# Patient Record
Sex: Female | Born: 1945 | Race: Black or African American | Hispanic: No | Smoking: Former smoker
Health system: Southern US, Community
[De-identification: ages and names within clinical notes are randomized; demographics above are authoritative.]

## PROBLEM LIST (undated history)

## (undated) DIAGNOSIS — E782 Mixed hyperlipidemia: Secondary | ICD-10-CM

## (undated) DIAGNOSIS — H269 Unspecified cataract: Secondary | ICD-10-CM

## (undated) DIAGNOSIS — M1990 Unspecified osteoarthritis, unspecified site: Secondary | ICD-10-CM

## (undated) DIAGNOSIS — J209 Acute bronchitis, unspecified: Secondary | ICD-10-CM

## (undated) DIAGNOSIS — Z8673 Personal history of transient ischemic attack (TIA), and cerebral infarction without residual deficits: Secondary | ICD-10-CM

## (undated) DIAGNOSIS — R011 Cardiac murmur, unspecified: Secondary | ICD-10-CM

## (undated) DIAGNOSIS — E119 Type 2 diabetes mellitus without complications: Secondary | ICD-10-CM

## (undated) DIAGNOSIS — I10 Essential (primary) hypertension: Secondary | ICD-10-CM

## (undated) HISTORY — PX: JOINT REPLACEMENT: SHX530

## (undated) HISTORY — DX: Essential (primary) hypertension: I10

## (undated) HISTORY — PX: TYMPANOSTOMY: SHX2586

## (undated) HISTORY — PX: NECK SURGERY: SHX720

## (undated) HISTORY — DX: Acute bronchitis, unspecified: J20.9

## (undated) HISTORY — DX: Mixed hyperlipidemia: E78.2

## (undated) HISTORY — DX: Cardiac murmur, unspecified: R01.1

## (undated) HISTORY — DX: Unspecified cataract: H26.9

## (undated) HISTORY — DX: Unspecified osteoarthritis, unspecified site: M19.90

## (undated) HISTORY — DX: Personal history of transient ischemic attack (TIA), and cerebral infarction without residual deficits: Z86.73

## (undated) HISTORY — DX: Type 2 diabetes mellitus without complications: E11.9

---

## 1999-01-06 ENCOUNTER — Ambulatory Visit (HOSPITAL_COMMUNITY): Admission: RE | Admit: 1999-01-06 | Discharge: 1999-01-06 | Payer: Self-pay | Admitting: Internal Medicine

## 1999-01-06 ENCOUNTER — Encounter: Payer: Self-pay | Admitting: Internal Medicine

## 1999-07-05 ENCOUNTER — Ambulatory Visit (HOSPITAL_COMMUNITY): Admission: RE | Admit: 1999-07-05 | Discharge: 1999-07-05 | Payer: Self-pay | Admitting: Endocrinology

## 1999-07-05 ENCOUNTER — Encounter: Payer: Self-pay | Admitting: Endocrinology

## 2000-01-08 ENCOUNTER — Encounter: Admission: RE | Admit: 2000-01-08 | Discharge: 2000-01-08 | Payer: Self-pay | Admitting: Otolaryngology

## 2000-01-08 ENCOUNTER — Encounter: Payer: Self-pay | Admitting: Otolaryngology

## 2000-10-31 ENCOUNTER — Encounter: Payer: Self-pay | Admitting: Otolaryngology

## 2000-10-31 ENCOUNTER — Encounter: Admission: RE | Admit: 2000-10-31 | Discharge: 2000-10-31 | Payer: Self-pay | Admitting: Otolaryngology

## 2000-11-04 ENCOUNTER — Encounter (INDEPENDENT_AMBULATORY_CARE_PROVIDER_SITE_OTHER): Payer: Self-pay | Admitting: Specialist

## 2000-11-04 ENCOUNTER — Ambulatory Visit (HOSPITAL_BASED_OUTPATIENT_CLINIC_OR_DEPARTMENT_OTHER): Admission: RE | Admit: 2000-11-04 | Discharge: 2000-11-04 | Payer: Self-pay | Admitting: Otolaryngology

## 2002-08-03 ENCOUNTER — Other Ambulatory Visit: Admission: RE | Admit: 2002-08-03 | Discharge: 2002-08-03 | Payer: Self-pay | Admitting: Family Medicine

## 2004-02-07 ENCOUNTER — Other Ambulatory Visit: Admission: RE | Admit: 2004-02-07 | Discharge: 2004-02-07 | Payer: Self-pay | Admitting: Family Medicine

## 2005-03-06 ENCOUNTER — Ambulatory Visit: Payer: Self-pay | Admitting: Family Medicine

## 2005-03-13 ENCOUNTER — Ambulatory Visit: Payer: Self-pay

## 2005-03-19 ENCOUNTER — Inpatient Hospital Stay (HOSPITAL_COMMUNITY): Admission: RE | Admit: 2005-03-19 | Discharge: 2005-03-22 | Payer: Self-pay | Admitting: Orthopedic Surgery

## 2006-02-04 ENCOUNTER — Ambulatory Visit: Payer: Self-pay | Admitting: Family Medicine

## 2006-05-07 ENCOUNTER — Ambulatory Visit: Payer: Self-pay | Admitting: Family Medicine

## 2007-04-08 ENCOUNTER — Encounter: Payer: Self-pay | Admitting: Internal Medicine

## 2007-05-06 ENCOUNTER — Ambulatory Visit: Payer: Self-pay | Admitting: Family Medicine

## 2007-06-24 ENCOUNTER — Ambulatory Visit: Payer: Self-pay | Admitting: Internal Medicine

## 2007-06-24 ENCOUNTER — Encounter: Payer: Self-pay | Admitting: Family Medicine

## 2007-07-08 DIAGNOSIS — I10 Essential (primary) hypertension: Secondary | ICD-10-CM | POA: Insufficient documentation

## 2007-07-11 ENCOUNTER — Ambulatory Visit: Payer: Self-pay | Admitting: Family Medicine

## 2007-07-11 LAB — CONVERTED CEMR LAB
Bilirubin Urine: NEGATIVE
Glucose, Urine, Semiquant: NEGATIVE
Nitrite: NEGATIVE
Protein, U semiquant: NEGATIVE
Specific Gravity, Urine: 1.02
Urobilinogen, UA: 0.2
pH: 7

## 2007-07-14 LAB — CONVERTED CEMR LAB
ALT: 25 units/L (ref 0–35)
AST: 19 units/L (ref 0–37)
Albumin: 3.5 g/dL (ref 3.5–5.2)
Alkaline Phosphatase: 79 units/L (ref 39–117)
BUN: 10 mg/dL (ref 6–23)
Basophils Absolute: 0 10*3/uL (ref 0.0–0.1)
Basophils Relative: 0.3 % (ref 0.0–1.0)
Bilirubin, Direct: 0.1 mg/dL (ref 0.0–0.3)
CO2: 28 meq/L (ref 19–32)
Calcium: 9.6 mg/dL (ref 8.4–10.5)
Chloride: 112 meq/L (ref 96–112)
Cholesterol: 282 mg/dL (ref 0–200)
Creatinine, Ser: 0.7 mg/dL (ref 0.4–1.2)
Direct LDL: 212.8 mg/dL
Eosinophils Absolute: 0.1 10*3/uL (ref 0.0–0.6)
Eosinophils Relative: 1.4 % (ref 0.0–5.0)
GFR calc Af Amer: 110 mL/min
GFR calc non Af Amer: 91 mL/min
Glucose, Bld: 136 mg/dL — ABNORMAL HIGH (ref 70–99)
HCT: 40.8 % (ref 36.0–46.0)
HDL: 51.7 mg/dL (ref >39.0)
Hemoglobin: 13.8 g/dL (ref 12.0–15.0)
Lymphocytes Relative: 36.3 % (ref 12.0–46.0)
MCHC: 33.7 g/dL (ref 30.0–36.0)
MCV: 93.8 fL (ref 78.0–100.0)
Monocytes Absolute: 0.8 10*3/uL — ABNORMAL HIGH (ref 0.2–0.7)
Monocytes Relative: 12.2 % — ABNORMAL HIGH (ref 3.0–11.0)
Neutro Abs: 3.5 10*3/uL (ref 1.4–7.7)
Neutrophils Relative %: 49.8 % (ref 43.0–77.0)
Platelets: 230 10*3/uL (ref 150–400)
Potassium: 3.8 meq/L (ref 3.5–5.1)
RBC: 4.35 M/uL (ref 3.87–5.11)
RDW: 13.8 % (ref 11.5–14.6)
Sodium: 146 meq/L — ABNORMAL HIGH (ref 135–145)
TSH: 0.92 microintl units/mL (ref 0.35–5.50)
Total Bilirubin: 0.7 mg/dL (ref 0.3–1.2)
Total CHOL/HDL Ratio: 5.5
Total Protein: 6.6 g/dL (ref 6.0–8.3)
Triglycerides: 129 mg/dL (ref 0–149)
VLDL: 26 mg/dL (ref 0–40)
WBC: 6.9 10*3/uL (ref 4.5–10.5)

## 2007-07-18 ENCOUNTER — Ambulatory Visit: Payer: Self-pay | Admitting: Family Medicine

## 2007-07-18 DIAGNOSIS — E119 Type 2 diabetes mellitus without complications: Secondary | ICD-10-CM

## 2007-07-18 DIAGNOSIS — E782 Mixed hyperlipidemia: Secondary | ICD-10-CM | POA: Insufficient documentation

## 2007-07-18 HISTORY — DX: Mixed hyperlipidemia: E78.2

## 2007-07-18 HISTORY — DX: Type 2 diabetes mellitus without complications: E11.9

## 2007-07-24 ENCOUNTER — Encounter: Payer: Self-pay | Admitting: Family Medicine

## 2007-12-29 ENCOUNTER — Encounter: Payer: Self-pay | Admitting: Family Medicine

## 2008-04-29 ENCOUNTER — Encounter: Payer: Self-pay | Admitting: Family Medicine

## 2008-05-21 ENCOUNTER — Emergency Department (HOSPITAL_COMMUNITY): Admission: EM | Admit: 2008-05-21 | Discharge: 2008-05-21 | Payer: Self-pay | Admitting: Family Medicine

## 2008-06-15 ENCOUNTER — Telehealth (INDEPENDENT_AMBULATORY_CARE_PROVIDER_SITE_OTHER): Payer: Self-pay | Admitting: *Deleted

## 2008-06-21 ENCOUNTER — Inpatient Hospital Stay (HOSPITAL_COMMUNITY): Admission: RE | Admit: 2008-06-21 | Discharge: 2008-06-23 | Payer: Self-pay | Admitting: Orthopedic Surgery

## 2008-06-23 ENCOUNTER — Ambulatory Visit: Payer: Self-pay | Admitting: Surgery

## 2008-06-23 ENCOUNTER — Encounter (INDEPENDENT_AMBULATORY_CARE_PROVIDER_SITE_OTHER): Payer: Self-pay | Admitting: Orthopedic Surgery

## 2008-07-15 ENCOUNTER — Encounter: Admission: RE | Admit: 2008-07-15 | Discharge: 2008-10-13 | Payer: Self-pay | Admitting: Orthopedic Surgery

## 2008-07-22 ENCOUNTER — Ambulatory Visit: Payer: Self-pay | Admitting: Family Medicine

## 2008-07-22 DIAGNOSIS — J209 Acute bronchitis, unspecified: Secondary | ICD-10-CM

## 2008-07-22 HISTORY — DX: Acute bronchitis, unspecified: J20.9

## 2009-08-16 ENCOUNTER — Ambulatory Visit: Payer: Self-pay | Admitting: Family Medicine

## 2009-08-16 LAB — CONVERTED CEMR LAB
Bilirubin Urine: NEGATIVE
Blood in Urine, dipstick: NEGATIVE
Glucose, Urine, Semiquant: NEGATIVE
Ketones, urine, test strip: NEGATIVE
Nitrite: NEGATIVE
Protein, U semiquant: NEGATIVE
Specific Gravity, Urine: 1.025
Urobilinogen, UA: 0.2
pH: 5

## 2009-08-19 LAB — CONVERTED CEMR LAB
AST: 26 units/L (ref 0–37)
Albumin: 4 g/dL (ref 3.5–5.2)
Alkaline Phosphatase: 79 units/L (ref 39–117)
Basophils Relative: 0.5 % (ref 0.0–3.0)
Bilirubin, Direct: 0 mg/dL (ref 0.0–0.3)
CO2: 27 meq/L (ref 19–32)
Calcium: 10 mg/dL (ref 8.4–10.5)
Creatinine, Ser: 0.8 mg/dL (ref 0.4–1.2)
Creatinine,U: 220.3 mg/dL
Eosinophils Absolute: 0.2 10*3/uL (ref 0.0–0.7)
GFR calc non Af Amer: 93.27 mL/min (ref >60)
Hemoglobin: 14.6 g/dL (ref 12.0–15.0)
Lymphocytes Relative: 40.7 % (ref 12.0–46.0)
MCHC: 33.3 g/dL (ref 30.0–36.0)
Microalb Creat Ratio: 11.8 mg/g (ref 0.0–30.0)
Monocytes Relative: 11.7 % (ref 3.0–12.0)
Neutro Abs: 4.1 10*3/uL (ref 1.4–7.7)
Neutrophils Relative %: 45.3 % (ref 43.0–77.0)
RBC: 4.56 M/uL (ref 3.87–5.11)
Sodium: 142 meq/L (ref 135–145)
Total Protein: 7.8 g/dL (ref 6.0–8.3)
WBC: 9.1 10*3/uL (ref 4.5–10.5)

## 2009-10-14 ENCOUNTER — Ambulatory Visit: Payer: Self-pay | Admitting: Family Medicine

## 2009-10-18 ENCOUNTER — Telehealth: Payer: Self-pay | Admitting: Family Medicine

## 2009-10-25 ENCOUNTER — Encounter (INDEPENDENT_AMBULATORY_CARE_PROVIDER_SITE_OTHER): Payer: Self-pay | Admitting: *Deleted

## 2011-02-09 ENCOUNTER — Encounter: Payer: Self-pay | Admitting: Family Medicine

## 2011-02-12 ENCOUNTER — Other Ambulatory Visit: Payer: Self-pay

## 2011-02-12 DIAGNOSIS — E785 Hyperlipidemia, unspecified: Secondary | ICD-10-CM

## 2011-02-12 DIAGNOSIS — Z0000 Encounter for general adult medical examination without abnormal findings: Secondary | ICD-10-CM

## 2011-02-12 LAB — POCT URINALYSIS DIPSTICK
Bilirubin, UA: NEGATIVE
Glucose, UA: NEGATIVE
Ketones, UA: NEGATIVE
Leukocytes, UA: NEGATIVE
pH, UA: 5

## 2011-02-12 LAB — CBC WITH DIFFERENTIAL/PLATELET
Eosinophils Relative: 1.2 % (ref 0.0–5.0)
HCT: 46.2 % — ABNORMAL HIGH (ref 36.0–46.0)
Hemoglobin: 15.7 g/dL — ABNORMAL HIGH (ref 12.0–15.0)
Lymphs Abs: 2.7 10*3/uL (ref 0.7–4.0)
MCV: 98.3 fl (ref 78.0–100.0)
Monocytes Absolute: 1 10*3/uL (ref 0.1–1.0)
Monocytes Relative: 12.4 % — ABNORMAL HIGH (ref 3.0–12.0)
Neutro Abs: 4 10*3/uL (ref 1.4–7.7)
WBC: 7.9 10*3/uL (ref 4.5–10.5)

## 2011-02-12 LAB — LIPID PANEL
Cholesterol: 313 mg/dL — ABNORMAL HIGH (ref 0–200)
Triglycerides: 180 mg/dL — ABNORMAL HIGH (ref 0.0–149.0)

## 2011-02-12 LAB — BASIC METABOLIC PANEL
Chloride: 100 mEq/L (ref 96–112)
GFR: 113.9 mL/min (ref >60.00)
Glucose, Bld: 154 mg/dL — ABNORMAL HIGH (ref 70–99)
Potassium: 4 mEq/L (ref 3.5–5.1)
Sodium: 137 mEq/L (ref 135–145)

## 2011-02-12 LAB — HEPATIC FUNCTION PANEL
ALT: 26 U/L (ref 0–35)
AST: 21 U/L (ref 0–37)
Total Protein: 7.2 g/dL (ref 6.0–8.3)

## 2011-02-12 LAB — LDL CHOLESTEROL, DIRECT: Direct LDL: 231.8 mg/dL

## 2011-02-19 ENCOUNTER — Telehealth: Payer: Self-pay

## 2011-02-19 NOTE — Telephone Encounter (Signed)
Message copied by Edgar Frisk on Mon Feb 19, 2011  8:30 AM ------      Message from: Jacquenette Shone      Created: Fri Feb 16, 2011  5:25 PM       Normal except her diabetes is not well controlled and her chol is very high. We will discuss these at her cpx.

## 2011-02-19 NOTE — Telephone Encounter (Signed)
Pt aware of results and appt is 02-22-2011

## 2011-02-21 ENCOUNTER — Encounter: Payer: Self-pay | Admitting: Family Medicine

## 2011-02-26 ENCOUNTER — Ambulatory Visit (INDEPENDENT_AMBULATORY_CARE_PROVIDER_SITE_OTHER): Payer: Self-pay | Admitting: Family Medicine

## 2011-02-26 ENCOUNTER — Encounter: Payer: Self-pay | Admitting: Family Medicine

## 2011-02-26 VITALS — BP 160/96 | HR 82 | Ht 65.0 in | Wt 243.0 lb

## 2011-02-26 DIAGNOSIS — Z0000 Encounter for general adult medical examination without abnormal findings: Secondary | ICD-10-CM

## 2011-02-26 MED ORDER — AMLODIPINE-OLMESARTAN 10-40 MG PO TABS: 1.0000 | ORAL_TABLET | Freq: Every day | ORAL | Status: DC

## 2011-02-26 MED ORDER — METFORMIN HCL 500 MG PO TABS: 500.0000 mg | ORAL_TABLET | Freq: Two times a day (BID) | ORAL | Status: DC

## 2011-02-26 MED ORDER — PRAVASTATIN SODIUM 40 MG PO TABS: 40.0000 mg | ORAL_TABLET | Freq: Every evening | ORAL | Status: DC

## 2011-02-26 NOTE — Progress Notes (Signed)
  Subjective:    Patient ID: [name], female    DOB: May 02, 1946, 65 y.o.   MRN: [id number]  HPI 65 yr old female for a cpx. She feels well in general, but she admits that she has not been taking care of herself. She does not exercise and she eats all the wring foods. She retired about a year ago, and she says now she sits around a lot and eats. She does not have insurance, but she plans to enroll in [place] by next January.    Review of Systems  Constitutional: Negative.   HENT: Negative.   Eyes: Negative.   Respiratory: Negative.   Cardiovascular: Negative.   Gastrointestinal: Negative.   Genitourinary: Negative for dysuria, urgency, frequency, hematuria, flank pain, decreased urine volume, enuresis, difficulty urinating, pelvic pain and dyspareunia.  Musculoskeletal: Negative.   Skin: Negative.   Neurological: Negative.   Hematological: Negative.   Psychiatric/Behavioral: Negative.        Objective:   Physical Exam  Constitutional: She is oriented to person, place, and time. She appears well-developed and well-nourished. No distress.  HENT:  Head: Normocephalic and atraumatic.  Right Ear: External ear normal.  Left Ear: External ear normal.  Nose: Nose normal.  Mouth/Throat: Oropharynx is clear and moist. No oropharyngeal exudate.  Eyes: Conjunctivae and EOM are normal. Pupils are equal, round, and reactive to light. No scleral icterus.  Neck: Normal range of motion. Neck supple. No JVD present. No thyromegaly present.  Cardiovascular: Normal rate, regular rhythm, normal heart sounds and intact distal pulses.  Exam reveals no gallop and no friction rub.   No murmur heard. Pulmonary/Chest: Effort normal and breath sounds normal. No respiratory distress. She has no wheezes. She has no rales. She exhibits no tenderness.  Abdominal: Soft. Bowel sounds are normal. She exhibits no distension and no mass. There is no tenderness. There is no rebound and no guarding.    Genitourinary: No breast swelling, tenderness, discharge or bleeding.  Musculoskeletal: Normal range of motion. She exhibits no edema and no tenderness.  Lymphadenopathy:    She has no cervical adenopathy.  Neurological: She is alert and oriented to person, place, and time. She has normal reflexes. No cranial nerve deficit. She exhibits normal muscle tone. Coordination normal.  Skin: Skin is warm and dry. No rash noted. No erythema.  Psychiatric: She has a normal mood and affect. Her behavior is normal. Judgment and thought content normal.          Assessment & Plan:  She is obese and she has problems including poorly controlled DM, HTN, and lipids. She also has some microscopic hematuria. She refuses to see a Urologist due to lack of money now, but she states that when her Medicare activates that she will go to Urology. She wants to put off a colonoscopy until then as well. She has joined the [employer] and wants to do [employer]. We will try to switch her meds to ones that she can get for $10 a month.

## 2011-04-10 NOTE — Op Note (Signed)
[name], [name]              ACCOUNT NO.:  [id number]   MEDICAL RECORD NO.:  [id number]          PATIENT TYPE:  INP   LOCATION:  4155                         FACILITY:  [facility]   PHYSICIAN:  Audree Camel. Noemi Chapel, M.D. DATE OF BIRTH:  [birth year]   DATE OF PROCEDURE:  06/21/2008  DATE OF DISCHARGE:                               OPERATIVE REPORT   PREOPERATIVE DIAGNOSIS:  Left knee degenerative joint disease.   POSTOPERATIVE DIAGNOSIS:  Left knee degenerative joint disease.   PROCEDURE:  Left total knee replacement using DePuy cemented total knee  system with #3 cemented femur and #3 cemented tibia with 15-mm  polyethylene RP tibial spacer and 32-mm polyethylene cemented patella.   SURGEON:  Audree Camel. Noemi Chapel, MD   ASSISTANT:  Matthew Saras, PA   ANESTHESIA:  General.   OPERATIVE TIME:  1 hour and 20 minutes.   COMPLICATIONS:  None.   DESCRIPTION FOR PROCEDURE:  Ms. Contant was brought to operating room on  June 21, 2008, after a femoral nerve block was placed in holding by  anesthesia.  She was placed on the operative table in supine position.  After being placed under general anesthesia, she had a Foley catheter  placed under sterile conditions.  She received Ancef 2 g IV  preoperatively for prophylaxis.  Her left knee was examined under  anesthesia.  Range of motion from -5 to 125 degrees with moderate varus  deformity with stable ligamentous exam with normal patellar tracking.  Left leg was then prepped using sterile DuraPrep and draped using  sterile technique.  Leg was exsanguinated and a thigh tourniquet  elevated at 365 mm.  Initially, through a 15 cm longitudinal incision  based over the patella, initial exposure was made.  The underlying subcutaneous tissues were incised along with skin  incision.  A median arthrotomy was performed revealing an excessive  amount of normal-appearing joint fluid.  The articular surfaces were  inspected.  She had grade 4 changes  medially, grade 3 changes laterally,  grade 3 to 4 changes in the patellofemoral joint.  Osteophytes removed  from the femoral condyles and tibial plateau.  The medial and lateral  meniscal remnants were removed, as well as anterior cruciate ligament.  An intramedullary drill was then drilled up the femoral canal for  placement of distal femoral cutting jig which was placed in the  appropriate manner rotation and the distal 11 mm cut was made.  The  distal femur was incised.  A #3 was found to be the appropriate size.  A  #3 cutting jig was placed in the appropriate manner of external rotation  and then these cuts were made.  The proximal tibia was then exposed.  The tibial spines were removed with an oscillating saw.  Intramedullary  drill was drilled down the tibial canal for placement of proximal tibial  cutting jig, which was placed in the appropriate manner rotation and a  proximal 6 mm cut was made based off the medial or lower side.  Spacer  blocks were then placed in flexion and extension.  The 15-mm blocks gave  excellent balancing,  excellent stability, and excellent correction of  her flexion and varus deformities.  A #3 tibial base plate trial was  placed on the cut tibial surface, found to be an excellent fit and then  a keel cut was made.  The PCL boxcutter was then placed on the distal  femur and then these cuts were made.  At this point, the #3 femoral  trial was placed with a #3 tibial base plate trial and a QA348G  polyethylene RP tibial spacer.  The knee was reduced, taken through  range of motion from 0 to 125 degrees with excellent stability and  excellent correction of her flexion and varus deformities and normal  patellar tracking.  A resurfacing 9 mm cut was then made on the patella  and 3 locking holes placed for a 32-mm patella and the patellar trial  was placed and again patellofemoral tracking was evaluated and found to  be normal.  At this point, it was felt that  all the trial components  were of excellent size and good stability.  They were then removed.  The  knee was then jet lavage irrigated with 3 liters of saline.  The  proximal tibia was then exposed.  A #3 tibial baseplate with cement  backing was hammered in position with an excellent fit with excess  cement being removed from around the edges.  A #3 femoral component with  cement backing was hammered into position also with an excellent fit  with excess cement being removed from around the edges.  The 15-mm  polyethylene RP tibial spacer was placed on tibial baseplate.  The knee  reduced, taken through a full range of motion, found to be stable.  Leg  lengths equal.  The 32-mm polyethylene cement backed patella was then  placed in its position and held there with a clamp.  After the cement  hardened, again patellofemoral tracking was evaluated and found to be  normal.  At this point, it was felt that all other components were of  excellent size, fit, and stability.  The wound was further irrigated  with saline.  Tourniquet was released.  Hemostasis obtained with  cautery.  The arthrotomy was then closed with #1 Ethilon suture over 2  medium Hemovac drains.  Subcutaneous tissues closed with 0 and 2-0  Vicryl, subcuticular layer closed with 4-0 Monocryl.  Sterile dressings  and long-leg splint applied.  The patient was then awakened, extubated,  and taken to recovery room in stable condition.  Needle and sponge count  was correct x2 at the end of the case.  Neurovascular status normal  postoperatively.      Robert A. Noemi Chapel, M.D.  Electronically Signed     RAW/MEDQ  D:  06/21/2008  T:  06/22/2008  Job:  [id number]

## 2011-04-13 NOTE — Op Note (Signed)
[place]  Patient:    [name], [name]                     MRN: [id number] Proc. Date: 11/04/00 Adm. Date:  [id number] Attending:  Jeneen Rinks CC:         Royetta Crochet. Karlton Lemon, M.D.  [place]  [id number], [place], [place], [place]  [id number]   Operative Report  JUSTIFICATION FOR PROCEDURE:  [name] is a 65 year old black female here today for a left lateral parotidectomy and bilateral myringotomies and transtympanic modified Richards T-tubes.  [name] presented on August 29, 2000 with a chief complaint of decreased hearing and a left facial mass.  She had had this left facial mass for approximately 1.5 years.  It was slowly enlarging.  She had seen another physician, who had recommended excision.  She desired a second opinion.  She had a CT scan of her parotid area in February 2001.  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  She had had a head cold in the past with noted decreased hearing in both ears.  She had serous otitis media bilaterally with 30 dB conductive hearing losses and normal discrimination.  She was placed on a Sterapred dose pack and modified Valsalva maneuvers.  She was recommended for a left lateral parotidectomy at [facility] with a 23-hour recovery care stay and bilateral myringotomies and transtympanic modified Richards T-tubes since the fluid did not resolve.  Risks and complications of the procedures were explained to her, including, but not limited to infection, bleeding, reaction to anesthesia, facial paresis or paralysis, delayed Freys syndrome, hypertrophic scar formation and need for additional procedures.  She understood that the T-tubes would stay in for a long time and may need to be removed surgically in the future.  JUSTIFICATION FOR  OUTPATIENT SETTING:  Patients age and need for general endotracheal anesthesia.  JUSTIFICATION FOR OVERNIGHT STAY:  Length of anesthesia and postoperative observation of facial function, status post left lateral parotidectomy and the need for drainage.  PREOPERATIVE DIAGNOSES: 1. Left lateral parotid mass. 2. 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  POSTOPERATIVE DIAGNOSIS: 1. Left lateral parotid mass. 2. 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  OPERATION: 1. Left lateral parotidectomy. 2. Bilateral myringotomies and transtympanic modified Richards T-tubes.  SURGEON:  Fannie Knee, M.D.  SECOND SURGEON:  Early Chars. Wilburn Cornelia, M.D.  ANESTHESIA:  Dr. Sherren Kerns, general endotracheal.  COMPLICATIONS:  None.  SUMMARY OF REPORT:  After the patient was taken to the operating room, she was placed in the supine position and an IV was begun by Dr. Sherren Kerns.  A general IV induction was then performed and the patient was orally intubated without difficulty.  Eyelids were taped shut and she was properly positioned and monitored.  Elbows and ankles were padded with foam rubber in the ENT fashion.  A small amount of hair was shaved in the left post auricular area. A doubly modified Blair incision was marked with marking pen and the incision line was infiltrated with 5 cc of 1% Xylocaine with 1:100,000 epinephrine. Silver wire and needle electrodes were then inserted into the forehead, left shoulder, left orbicularis oris and oculi muscles and connected to a Nim facial nerve  monitor preamplifier.  The patients right ear canal was then cleaned of cerumen and debris.  Her right tympanic membrane was found to be dull and retracted.  An anterior/superior radial myringotomy incision was made and serous fluid was suction evacuated.  A modified Richards T-tube was inserted and Pediotic drops were insufflated.  The  identical procedure and findings applied to the left ear.  The patients left ear and hemiface were then prepped with Betadine and draped in a standard fashion for a parotidectomy.  The doubly modified Blair incision was then incised with a 15-blade.  Skin was carried down to parotid fascia.  The patient had relatively thick flaps as she was moderately obese.  Anterior and posterior flaps were then elevated and a 2-0 silk retaining suture was placed in the left ear lobe.  Dissection was carried down to the mastoid tip and into the sternocleidomastoid muscle and posterior belly of the digastric inferiorly.  The dissection was then carried superiorly in the tympanomastoid groove to expose the tragal pointer.  The main trunk of the facial nerve was then identified with a Production manager. The main trunk of the nerve was in the normal location and stimulated at 0.5 mA.  The main trunk was then followed anteriorly.  It became clear that the 6 to 7 cm tumor mass was located in the lateral lobe inferior to the main trunk of the nerve and also inferior to the buccal position of the nerve.  The buccal position was dissected anteriorly and the gland was cut with #12-blade under direct vision.  The superior division of the nerve was not dissected. The dissection was then carried anteriorly and the tumor mass had been found to be pushing the marginal mandibular branch and cervical branches inferiorly. The dissection was then carried anteriorly with care to preserve these branches.  The cervical branch was taken inferiorly.  Dissection was then carried under the mass.  In fact, the mass had just simply dissected almost like a pea from a pod from the surrounding gland.  The capsule of the gland was ruptured inadvertently anteriorly and medially.  However, there was very little tumor spillage.  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  The  inferolateral lobe was then further dissected again following each branch of the facial nerve anteriorly until the lateral inferior half of the gland had been completely removed.  During the procedure, vessels were ligated with 3-0 silk ties.  The surgical field was then copiously irrigated with bacitracin containing saline. The main trunk of the nerve stimulated at 0.3 mA at the termination of the procedure full face.  Further bleeding was controlled with bipolar cautery, especially some troublesome bleeding in the tympanomastoid groove laterally. A #10 mm Jackson-Pratt drain was then inserted through separate stab incision in the inferior part of the incision line.  This was then hooked to suction. The incision was then closed in two layers using interrupted inverted 3-0 chromics for a subcutaneous layer and skin was closed inferiorly with a running 5-0 Novofil and superiorly with a running 6-0 Novofil.  Steri-Strips were applied and a 4 x 4 Hypafix dressing was applied to the inferior two-thirds of the incision line.  Patient was then awakened, extubated and transferred to her hospital bed.  She appeared to tolerate both the general anesthesia and the procedure as well.  Left the operating room in stable condition.  TOTAL FLUIDS:  2800 cc.  ESTIMATED BLOOD LOSS:  50 cc.  Sponge, needle, and cotton ball counts were correct at termination of the procedure.  The left lateral parotid tumor and left lateral parotid gland were sent to pathology for permanent section.  Frozen was taken from the tumor mass itself.  Patient received Ancef 1 mg IV, Zofran 4 mg IV at the beginning, 4 mg IV at termination of the procedure and Decadron 12 mg IV.  Patient had also received preoperative Vioxx for perioperative analgesia.  She was to take 50 mg p.o. on November 03, 2000 and will be maintained on the medication at 50 mg a day for the next 10 days.  [name] will be admitted to the 23-hour recovery  care unit for IV hydration, pain control, postoperative observation of her left facial function. If stable overnight, she will be discharged on November 05, 2000 and will be instructed to return to my office on November 11, 2000 at 2:40 p.m.  Discharge medications will include the following:  Augmentin 875 mg p.o. b.i.d. x 10 days with food, Percocet #30 one to two p.o. q6h. p.r.n. pain, Cortisporin otic suspension three drops AU t.i.d. x 3 days and Phenergan suppositories 25 mg #2 one p.r. q.6h. p.r.n. nausea.  She is to follow a soft diet x one week, keep her head elevated and avoid aspirin or aspirin products. She is to call [phone] for any postoperative problems.  She and her husband will be given verbal and written instructions. DD:  11/04/00 TD:  11/04/00 Job: [id number]

## 2011-04-13 NOTE — Op Note (Signed)
NAMEKINNA, [name]              ACCOUNT NO.:  [id number]   MEDICAL RECORD NO.:  [id number]          PATIENT TYPE:  INP   LOCATION:  2550                         FACILITY:  [facility]   PHYSICIAN:  Audree Camel. Noemi Chapel, M.D. DATE OF BIRTH:  November 24, 1946   DATE OF PROCEDURE:  03/19/2005  DATE OF DISCHARGE:                                 OPERATIVE REPORT   PREOPERATIVE DIAGNOSIS:  Right knee degenerative joint disease.   POSTOPERATIVE DIAGNOSIS:  Right knee degenerative joint disease.   OPERATION PERFORMED:  1.  Right total knee replacement, using Depuy total knee system with #3      cemented femoral component,  #3 cemented tibial component with 10      polyethylene RP tibial spacer and 32 mm polyethylene cemented patella.  2.  Right knee computer assisted navigation.   SURGEON:  Audree Camel. Noemi Chapel, M.D.   ASSISTANT:  Matthew Saras, P.A.   ANESTHESIA:  General.   OPERATIVE TIME:  One hour and 50 minutes.   COMPLICATIONS:  None.   DESCRIPTION OF PROCEDURE:  [name] was brought to the operating room on  March 19, 2005 and placed on the operating table in supine position.  After  an adequate level of general anesthesia was obtained, her right knee was  examined, she had range of motion from -25 to 95 degrees with moderate varus  deformity, knee stable to ligamentous exam with normal patellar tracking.  She had a Foley catheter placed under sterile conditions and received Ancef  1 g IV preoperatively for prophylaxis.  The right leg was then prepped using  sterile DuraPrep and draped using sterile technique.  The leg was  exsanguinated and a thigh tourniquet elevated 375 mm.  Initially, through a  15 cm longitudinal incision based over the patella, initial exposure was  made.  The underlying subcutaneous tissues were incised in line with the  skin incision.  A median arthrotomy was performed revealing an excessive  amount of normal-appearing joint fluid.  The articular surfaces  were  inspected.  She had grade 4 changes medially, grade 3 changes laterally and  grade 3 and 4 changes in the patellofemoral joint.  Osteophytes were removed  from the femoral condyles and tibial plateau.  Medial and lateral meniscal  remnants were removed as well as the anterior cruciate ligament.  After this  was done, then two pins were placed in the proximal tibia and two pins in  the distal femur for placement and set up the computer navigation apparatus.  This navigation system was then activated.  The knee was brought through a  full thickness skin grafting range of motion and after activation and  registration, the deformities were evaluated and found to be 29 degrees of  flexion deformity and approximately 8 degrees of varus deformity.  At this  point then the distal femur cut was made using computer navigation resecting  12 mm off the distal femur.  This cut was then verified.  The distal femur  had been sized at a #3 size and then a #3 cutting jig was placed in the  appropriate amount  of external rotation and then these cuts were made. After  these were made, then these cuts were verified and found to be satisfactory.  Proximal tibia cut was then made again using computer navigation system  taking 10 mm off the lateral then 5 mm off the medial side again with an  excellent cut, perfect cut made with the computer navigation system.  After  this was done, then the PCL box cutter was placed and the PCL box cut was  made.  The proximal tibia was sized as well.  A #3 was found to be the  appropriate size and the keel cut was made.  At this point then the #3  femoral trial was placed.  A #3 tibial base plate trial was placed and with  a 10 mm polyethylene RP tibial spacer, there was found to be excellent  restoration of normal alignment, excellent stability.  Range of motion  corrected to -3 to 125 degrees.  After this was done and the patella was  sized.  A 32 mm was found to be the  appropriate size and an 8 mm resurfacing  cut was made, then three locking holes were placed.  The patellar trial was  placed, the knee taken through a range of motion and found to be  satisfactory patellar tracking.  After this was done it was felt that all  the trial components were of excellent, size, fit and stability.  They were  then removed.  The knee was then jet lavage irrigated with 3 L of saline  solution.  The proximal tibia was then exposed and a #3 tibial base plate  with cement backing  was hammered into position with an excellent fit with  excess cement being removed from around the edges.  The #3 femoral component  with cement backing was hammered in position also with an excellent with  excess cement being removed from around the edges.  The 10 mm polyethylene  RP tibial spacer was then placed on the tibial base plate. Knee taken  through a range of motion from -3 to 125 degrees with excellent correction  of her varus deformity.  The 32 mm polyethylene cement backed patella was  then placed into its three recessed hole and held in position with a clamp.  After the cement hardened, patellofemoral tracking was evaluated and this  was found to be normal.  At this point it was felt that all of the  components were of excellent size fit and stability.  The wound was further  irrigated and then the tourniquet was released.  Hemostasis was obtained  with cautery.  The arthrotomy was closed with #1 Ethibond suture over two  medium Hemovac drains.  Subcutaneous tissues closed with 0 and 2-0 Vicryl.  Subcuticular  layer closed with 3-0 Monocryl.  Steri-Strips were applied.  Sterile dressings and a long leg splint applied.  Hemovac injected with  0.25% Marcaine with epinephrine and clamped.  The patient then awakened,  extubated and taken to recovery room in stable condition.  Sponge and needle  counts were correct times two at the end of this case.      RAW/MEDQ  D:  03/19/2005   T:  03/19/2005  Job:  [id number]

## 2011-04-13 NOTE — Discharge Summary (Signed)
[name], [name]              ACCOUNT NO.:  [id number]   MEDICAL RECORD NO.:  [id number]          PATIENT TYPE:  INP   LOCATION:  5013                         FACILITY:  [facility]   PHYSICIAN:  Audree Camel. Noemi Chapel, M.D. DATE OF BIRTH:  1945/12/24   DATE OF ADMISSION:  03/19/2005  DATE OF DISCHARGE:  03/22/2005                                 DISCHARGE SUMMARY   ADMISSION DIAGNOSES:  1.  End-stage degenerative joint disease, right knee.  2.  Hypertension.  3.  Cardiac murmur.   DISCHARGE DIAGNOSES:  1.  End-stage degenerative joint disease, right knee, status post total knee      replacement.  2.  Hypertension.  3.  Cardiac murmur.   HISTORY OF PRESENT ILLNESS:  Patient is a 65 year old black female with a  history of end-stage DJD of her right knee.  She has tried conservative care  including anti-inflammatories and articular cortisone injections, however,  without success.  She understands the risks, benefits and possible  complications of a right total knee replacement and is without question.   PROCEDURES IN HOUSE:  On March 19, 2005, she underwent a right total knee  replacement by Dr. Noemi Chapel and a femoral nerve block by anesthesia.  She  tolerated both procedures well.  She was admitted postoperatively for pain  control, DVT prophylaxis and physical therapy.   On postoperative day #1, patient was doing well with no complaints.  Her  hemoglobin was 11.8.  Her INR was 1.1.  She was metabolically stable and  afebrile.  Her surgical wound was well approximated.  Her dressing was  changed.  Her drain was discontinued.  Her O2 was discontinued.  Her PCA was  discontinued.  She was put on p.o. pain medications.   On postoperative day #2, patient continued to progress.  Her surgical wound  was well approximated.  Her hemoglobin was 11.2.  Her INR was 1.5.  She was  afebrile and metabolically stable.  Her Foley was discontinued.  Her IV was  heplocked.  Her dressing was  changed.   On postoperative day #3, patient did very well in physical therapy.  Her  hemoglobin was 11.  INR was 1.5.  She was metabolically stable and afebrile.  She was discharged to home in stable condition following a suppository and  formal physical therapy and having a bowel movement.  She is weightbearing  as tolerated on a regular diet, getting physical therapy three to four times  a week from home health physical therapy.  She is using a walker with  wheels.   DISCHARGE MEDICATIONS:  1.  Percocet one to two q.4-6h. p.r.n. pain.  2.  Diovan 160/25 one p.o. daily.  3.  Norvasc 5 mg one p.o. daily.  4.  K-Dur 20 mEq one p.o. daily.  5.  Robaxin one tablet q.6h. p.r.n. muscle spasm.  6.  Coumadin 7.5 mg daily at 6 p.m.  7.  Colace 100 mg one p.o. daily.  8.  Senokot S two tablets before dinner.   She has been instructed to use her CPM 0 to 90 degrees eight hours  a day and  elevate her right heel on a folded pillow for 30 minutes every morning.  She  has been instructed to change her dressing daily, call with increased pain,  increased redness, increased drainage or a temperature greater than 101.  She will follow up with Dr. Noemi Chapel on May 8.       KS/MEDQ  D:  05/16/2005  T:  05/16/2005  Job:  [id number]

## 2011-08-24 LAB — DIFFERENTIAL
Basophils Absolute: 0
Basophils Absolute: 0
Basophils Absolute: 0.1
Basophils Relative: 0
Basophils Relative: 0
Basophils Relative: 1
Eosinophils Absolute: 0
Eosinophils Absolute: 0.1
Eosinophils Absolute: 0.1
Eosinophils Relative: 1
Monocytes Absolute: 0.8
Monocytes Absolute: 1.3 — ABNORMAL HIGH
Monocytes Relative: 10
Monocytes Relative: 11
Neutro Abs: 4.3
Neutrophils Relative %: 67

## 2011-08-24 LAB — URINE CULTURE: Colony Count: >=100000

## 2011-08-24 LAB — CBC
HCT: 35.7 — ABNORMAL LOW
HCT: 45.8
Hemoglobin: 12.1
Hemoglobin: 15.2 — ABNORMAL HIGH
MCHC: 33.1
MCHC: 34
MCV: 95.1
RBC: 3.96
RDW: 14
RDW: 14
RDW: 14.4
WBC: 8.4

## 2011-08-24 LAB — BASIC METABOLIC PANEL
BUN: 10
CO2: 29
CO2: 30
Calcium: 9
Chloride: 99
Creatinine, Ser: 0.91
GFR calc Af Amer: >60
Glucose, Bld: 130 — ABNORMAL HIGH
Glucose, Bld: 137 — ABNORMAL HIGH
Potassium: 3.8
Sodium: 137

## 2011-08-24 LAB — URINALYSIS, ROUTINE W REFLEX MICROSCOPIC
Bilirubin Urine: NEGATIVE
Glucose, UA: NEGATIVE
Hgb urine dipstick: NEGATIVE
Ketones, ur: NEGATIVE
Specific Gravity, Urine: 1.025
pH: 5

## 2011-08-24 LAB — TYPE AND SCREEN
ABO/RH(D): O POS
Antibody Screen: NEGATIVE

## 2011-08-24 LAB — COMPREHENSIVE METABOLIC PANEL
ALT: 17
Albumin: 4
Alkaline Phosphatase: 80
BUN: 12
Chloride: 105
Glucose, Bld: 91
Potassium: 4.2
Sodium: 139
Total Bilirubin: 1.1
Total Protein: 7.3

## 2011-08-24 LAB — HEMOGLOBIN A1C: Mean Plasma Glucose: 147

## 2011-12-19 ENCOUNTER — Encounter: Payer: Self-pay | Admitting: Family Medicine

## 2011-12-19 ENCOUNTER — Ambulatory Visit (INDEPENDENT_AMBULATORY_CARE_PROVIDER_SITE_OTHER): Payer: Medicare Other | Admitting: Family Medicine

## 2011-12-19 VITALS — BP 150/94 | HR 94 | Temp 98.6°F | Wt 231.0 lb

## 2011-12-19 DIAGNOSIS — J329 Chronic sinusitis, unspecified: Secondary | ICD-10-CM

## 2011-12-19 DIAGNOSIS — Z0000 Encounter for general adult medical examination without abnormal findings: Secondary | ICD-10-CM

## 2011-12-19 DIAGNOSIS — R3129 Other microscopic hematuria: Secondary | ICD-10-CM

## 2011-12-19 MED ORDER — AZITHROMYCIN 250 MG PO TABS: ORAL_TABLET | ORAL | Status: AC

## 2011-12-19 NOTE — Progress Notes (Signed)
  Subjective:    Patient ID: [name], female    DOB: 11/07/1946, 66 y.o.   MRN: [id number]  HPI Here for 4 days sinus pressure, HA, ST, and a dry cough.    Review of Systems  Constitutional: Negative.   HENT: Positive for congestion, postnasal drip and sinus pressure.   Eyes: Negative.   Respiratory: Positive for cough.        Objective:   Physical Exam  Constitutional: She appears well-developed and well-nourished.  HENT:  Left Ear: External ear normal.  Nose: Nose normal.  Mouth/Throat: Oropharynx is clear and moist. No oropharyngeal exudate.       Right ear has a dislodged PE tube lying in the canal  Eyes: Conjunctivae are normal.  Pulmonary/Chest: Effort normal and breath sounds normal.  Lymphadenopathy:    She has no cervical adenopathy.          Assessment & Plan:  Drink fluids and add Mucinex

## 2011-12-24 ENCOUNTER — Telehealth: Payer: Self-pay | Admitting: Family Medicine

## 2011-12-24 MED ORDER — DOXYCYCLINE HYCLATE 100 MG PO TABS: 100.0000 mg | ORAL_TABLET | Freq: Two times a day (BID) | ORAL | Status: AC

## 2011-12-24 NOTE — Telephone Encounter (Signed)
Script sent e-scribe, note is ready for pick up and I spoke with pt.

## 2011-12-24 NOTE — Telephone Encounter (Signed)
Call in Doxycycline 100 mg bid for 10 days  

## 2011-12-24 NOTE — Telephone Encounter (Signed)
Pt was dx with sinus inf on 12-19-2011. Pt has finished zpak still has head congestion. Pt is requesting work note unable to go to work 1-23 thru 12-25-2011. [employer] [place]

## 2011-12-24 NOTE — Telephone Encounter (Signed)
Pt called to check on status of getting a different abx or med for congestion to [facility] on [address]. Also pt needs a work note from 12/19/11 thru 12/25/11, because pt is unable to go to work. Pls call asap today.

## 2012-03-03 ENCOUNTER — Encounter: Payer: Self-pay | Admitting: Gastroenterology

## 2012-07-09 ENCOUNTER — Telehealth: Payer: Self-pay | Admitting: Family Medicine

## 2012-07-09 NOTE — Telephone Encounter (Signed)
Caller: Dajanee/Patient; Patient Name: [name]; PCP: Laurey Morale.; Best Callback Phone Number: [phone]      Has been told she is borderline diabetic, needs to watch her starches, breads, sauces, gravies.  Has been taking Metformin but has run out of pills, does not have doses for today 8/14.  Is also out of Pravachol and these 2 medicines do not have refills at [facility].  Has been taking Azor for blood pressure via samples and will be out of those within the next 2 days.  Needing all medicines called to [facility].   Says has been seen in office earlier this year, does not monitor blood sugars, does not have glucometer.  Noting urinary frequency with urgency especially at nights for 2 days, thinks blood sugar may be up.  Notes weight loss as clothing not fitting as well.   Triaged in Urinary Symptoms Female Guideline - Disposition: See Provider Within 72 Hours due to increaed frequency of urination, unexplained weight loss.   PLEASE FOLLOW UP WITH PATIENT ABOUT MEDICINES, POSSIBLE APPOINTMENT TO FOLLOW UP BLOOD SUGARS.

## 2012-07-09 NOTE — Telephone Encounter (Signed)
Appt made

## 2012-07-09 NOTE — Telephone Encounter (Signed)
Please call make appt for Friday to be seen

## 2012-07-11 ENCOUNTER — Ambulatory Visit (INDEPENDENT_AMBULATORY_CARE_PROVIDER_SITE_OTHER): Payer: Medicare Other | Admitting: Family Medicine

## 2012-07-11 ENCOUNTER — Encounter: Payer: Self-pay | Admitting: Family Medicine

## 2012-07-11 VITALS — BP 152/80 | Temp 98.3°F | Wt 223.0 lb

## 2012-07-11 DIAGNOSIS — I10 Essential (primary) hypertension: Secondary | ICD-10-CM

## 2012-07-11 DIAGNOSIS — E119 Type 2 diabetes mellitus without complications: Secondary | ICD-10-CM

## 2012-07-11 DIAGNOSIS — E785 Hyperlipidemia, unspecified: Secondary | ICD-10-CM

## 2012-07-11 LAB — POCT URINALYSIS DIPSTICK
Ketones, UA: NEGATIVE
Urobilinogen, UA: 0.2
pH, UA: 5.5

## 2012-07-11 LAB — CBC WITH DIFFERENTIAL/PLATELET
Basophils Relative: 0.3 % (ref 0.0–3.0)
Eosinophils Absolute: 0.1 10*3/uL (ref 0.0–0.7)
Lymphocytes Relative: 39.5 % (ref 12.0–46.0)
MCHC: 32.3 g/dL (ref 30.0–36.0)
Neutrophils Relative %: 45.9 % (ref 43.0–77.0)
RBC: 4.64 Mil/uL (ref 3.87–5.11)
WBC: 6.9 10*3/uL (ref 4.5–10.5)

## 2012-07-11 LAB — BASIC METABOLIC PANEL
CO2: 28 mEq/L (ref 19–32)
Calcium: 9.8 mg/dL (ref 8.4–10.5)
Creatinine, Ser: 0.8 mg/dL (ref 0.4–1.2)
Glucose, Bld: 100 mg/dL — ABNORMAL HIGH (ref 70–99)

## 2012-07-11 LAB — HEPATIC FUNCTION PANEL
Albumin: 4 g/dL (ref 3.5–5.2)
Alkaline Phosphatase: 62 U/L (ref 39–117)
Total Protein: 7.7 g/dL (ref 6.0–8.3)

## 2012-07-11 MED ORDER — METFORMIN HCL 500 MG PO TABS: 500.0000 mg | ORAL_TABLET | Freq: Two times a day (BID) | ORAL | Status: DC

## 2012-07-11 MED ORDER — AMLODIPINE-OLMESARTAN 10-40 MG PO TABS: 1.0000 | ORAL_TABLET | Freq: Every day | ORAL | Status: DC

## 2012-07-11 MED ORDER — PRAVASTATIN SODIUM 40 MG PO TABS: 40.0000 mg | ORAL_TABLET | Freq: Every evening | ORAL | Status: DC

## 2012-07-11 NOTE — Progress Notes (Signed)
  Subjective:    Patient ID: [name], female    DOB: July 01, 1946, 66 y.o.   MRN: [id number]  HPI Here to follow up. She admits to not following her diet well. She ran out of all her meds 3 days ago. She feels well in general.    Review of Systems  Constitutional: Negative.   Respiratory: Negative.   Cardiovascular: Negative.        Objective:   Physical Exam  Constitutional: She appears well-developed and well-nourished.  Neck: No thyromegaly present.  Cardiovascular: Normal rate, regular rhythm, normal heart sounds and intact distal pulses.   Pulmonary/Chest: Effort normal and breath sounds normal.  Lymphadenopathy:    She has no cervical adenopathy.          Assessment & Plan:  Refilled her meds. Get labs today including an A1c. Use OTC nicotine patches to help quit smoking.

## 2012-07-14 NOTE — Progress Notes (Signed)
Quick Note:  Left message with normal results. ______

## 2012-08-30 ENCOUNTER — Emergency Department (INDEPENDENT_AMBULATORY_CARE_PROVIDER_SITE_OTHER)
Admission: EM | Admit: 2012-08-30 | Discharge: 2012-08-30 | Disposition: A | Discharge status: Home / Self Care | Payer: Medicare Other | Source: Home / Self Care | Attending: Emergency Medicine | Admitting: Emergency Medicine

## 2012-08-30 ENCOUNTER — Encounter (HOSPITAL_COMMUNITY): Payer: Self-pay | Admitting: Emergency Medicine

## 2012-08-30 DIAGNOSIS — B029 Zoster without complications: Secondary | ICD-10-CM

## 2012-08-30 MED ORDER — ACYCLOVIR 400 MG PO TABS: 800.0000 mg | ORAL_TABLET | ORAL | Status: DC

## 2012-08-30 NOTE — ED Provider Notes (Signed)
Chief Complaint  Patient presents with  . Herpes Zoster    History of Present Illness:   The patient is a 66 year old female who woke up this morning with a rash on her upper left back and left breast. This was blistered, but she denies any pain or itching. She has never had a rash like this in the past. She felt a little bit feverish yesterday. She denies any rash anywhere else. She has had a history of chickenpox in the past.  Review of Systems:  Other than noted above, the patient denies any of the following symptoms: Systemic:  No fever, chills, sweats, weight loss, or fatigue. ENT:  No nasal congestion, rhinorrhea, sore throat, swelling of lips, tongue or throat. Resp:  No cough, wheezing, or shortness of breath. Skin:  No rash, itching, nodules, or suspicious lesions.  [place]:  Past medical history, family history, social history, meds, and allergies were reviewed.  Physical Exam:   Vital signs:  BP 141/68  Pulse 103  Temp 98.7 F (37.1 C) (Oral)  Resp 19  SpO2 100% Gen:  Alert, oriented, in no distress. ENT:  Pharynx clear, no intraoral lesions, moist mucous membranes. Lungs:  Clear to auscultation. Skin:  There were clusters of vesicles on erythematous base in a dermatomal distribution extending from the mid upper back back on the left to the left breast. There no lesions crossing the midline and the lesions elsewhere on the body.  Assessment:  The encounter diagnosis was Shingles.  Plan:   1.  The following meds were prescribed:   New Prescriptions   ACYCLOVIR (ZOVIRAX) 400 MG TABLET    Take 2 tablets (800 mg total) by mouth every 4 (four) hours while awake.   2.  The patient was instructed in symptomatic care and handouts were given. 3.  The patient was told to return if becoming worse in any way, if no better in 3 or 4 days, and given some red flag symptoms that would indicate earlier return.     Harden Mo, MD 08/30/12 [phone]

## 2012-08-30 NOTE — ED Notes (Signed)
Pt c/o poss shingles since this morning... Woke up w/a rash on her left breast/chest, had a fever yesterday... Denies: pain/itching, vomiting, nausea

## 2012-09-08 ENCOUNTER — Telehealth: Payer: Self-pay | Admitting: Family Medicine

## 2012-09-08 NOTE — Telephone Encounter (Signed)
noted 

## 2012-09-08 NOTE — Telephone Encounter (Signed)
Caller: Zyann/Patient; Patient Name: [name]; PCP: Alysia Penna [facility]); Best Callback Phone Number: [phone]. Reports that she was diagnosed with shingles on 08/30/12 at the [facility] ED. Patient is currently taking Acyclovir 2 tablets every 4 hours. Patient reports that she has been increasingly sleepy. Reports shingles rash to left breast. Reports she has been taking Tylenol for pain. Reports that the rash is dry skin now. Has been using Calamine lotion to prevent the itching. Reports feeling very tired and sleepy. Patient wants to know if this is caused by the Acyclovir or the shingles. Advised over age 44 side effects can be increased. Normal side effect of Acyclovir is drowsiness or dizziness. Information given per [employer]. Triaged per Fatigue guideline, see in 72 hour disposition for "with activity that usually does not cause fatigue." Advised patient that if drowsiness does not improve after finishing her last dose of medication today to call back and schedule appointment. Care advice and call back parameters given per guideline. Patient verbalized understanding.

## 2012-09-25 ENCOUNTER — Ambulatory Visit (INDEPENDENT_AMBULATORY_CARE_PROVIDER_SITE_OTHER): Payer: Medicare Other | Admitting: Family Medicine

## 2012-09-25 ENCOUNTER — Encounter: Payer: Self-pay | Admitting: Family Medicine

## 2012-09-25 VITALS — BP 148/90 | HR 94 | Temp 98.5°F

## 2012-09-25 DIAGNOSIS — B029 Zoster without complications: Secondary | ICD-10-CM

## 2012-09-25 MED ORDER — HYDROCODONE-ACETAMINOPHEN 5-500 MG PO TABS: 1.0000 | ORAL_TABLET | Freq: Four times a day (QID) | ORAL | Status: DC | PRN

## 2012-09-25 NOTE — Progress Notes (Signed)
  Subjective:    Patient ID: [name], female    DOB: 1946/09/17, 66 y.o.   MRN: [id number]  HPI Here to follow up shingles, which involves the left chest and left upper back. She saw Urgent Care on 08-30-12 and was given a course of Acyclovir. She was not given steroids. The rash is drying up now but she still has quite a bit of pain. Using Tylenol.    Review of Systems  Constitutional: Negative.   Cardiovascular: Positive for chest pain.  Skin: Positive for rash.       Objective:   Physical Exam  Constitutional: She appears well-developed and well-nourished.  Skin:       Macular pink ras over the left chest and left upper back          Assessment & Plan:  Shingles. She has finished the Acyclovir. Given Vicodin for pain

## 2012-09-27 ENCOUNTER — Other Ambulatory Visit: Payer: Self-pay | Admitting: Family Medicine

## 2012-10-06 ENCOUNTER — Telehealth: Payer: Self-pay | Admitting: Family Medicine

## 2012-10-06 NOTE — Telephone Encounter (Signed)
I'm sure the lump in the left arm pit is a reactive lymph node which is reacting to the shingles over her left upper body. This is common. I don't think there is much more we can do for the itching than Benadryl. She may want to try capsaicin (chili pepper) cream on the areas.

## 2012-10-06 NOTE — Telephone Encounter (Signed)
Caller: [name]/Patient; Patient Name: [name]; PCP: Alysia Penna [facility]); Best Callback Phone Number: [phone]; Reason for call: Shingles and intense itching.  Pt has taken all the medications and is now having severe itching in the area and tenderness.  Pain medication is helping.  Pt using Benadryl gel and Calamine lotion to stop the itchy but it is not working. Pt feels like it is not out of her system.  Advised pt it can take 1-2 months for the healing to be complete.  All emergent symptoms r/o per Skin Lesions protocol w/exception to 'Severe or persistent itching that interferes with ability to carry out usual activities or sleep'. Home care advice given.  See Provider in 24 hrs.  Pt declined appt. Upon ending the call the pt mentioned a lump in her left armpit.  Pt unable to answer triage questions about lump other than it is new.  Unable to determine if lump is tender due to use of pain medication.  Says left breast feel like it weighs 100 lbs.  Advised appt. Pt says she was just there on 09/25/12 and wants to know what else she can do to get this out of her system.   PLEASE

## 2012-10-06 NOTE — Telephone Encounter (Signed)
I left voice message with the below information.

## 2012-12-01 ENCOUNTER — Ambulatory Visit (INDEPENDENT_AMBULATORY_CARE_PROVIDER_SITE_OTHER): Payer: Medicare Other | Admitting: Family Medicine

## 2012-12-01 ENCOUNTER — Encounter: Payer: Self-pay | Admitting: Family Medicine

## 2012-12-01 ENCOUNTER — Ambulatory Visit: Payer: Medicare Other | Admitting: Family Medicine

## 2012-12-01 VITALS — BP 146/90 | HR 108 | Temp 98.1°F | Wt 226.0 lb

## 2012-12-01 DIAGNOSIS — Z23 Encounter for immunization: Secondary | ICD-10-CM

## 2012-12-01 DIAGNOSIS — B0229 Other postherpetic nervous system involvement: Secondary | ICD-10-CM

## 2012-12-01 MED ORDER — HYDROCODONE-ACETAMINOPHEN 5-325 MG PO TABS: 1.0000 | ORAL_TABLET | Freq: Four times a day (QID) | ORAL | Status: DC | PRN

## 2012-12-01 MED ORDER — GABAPENTIN 100 MG PO CAPS: 100.0000 mg | ORAL_CAPSULE | Freq: Three times a day (TID) | ORAL | Status: DC

## 2012-12-01 NOTE — Progress Notes (Signed)
  Subjective:    Patient ID: [name], female    DOB: [birth year], 67 y.o.   MRN: [id number]  HPI Here for continued pain from shingles over the left shoulder and chest. This has been going on for 3 months now. She has taken several rounds of Valtrex with little relief.    Review of Systems  Constitutional: Negative.        Objective:   Physical Exam  Constitutional: She is oriented to person, place, and time. She appears well-developed and well-nourished.  Neurological: She is alert and oriented to person, place, and time.          Assessment & Plan:  Try Neurontin 100 mg tid for several weeks. We can taper up if needed.

## 2012-12-18 ENCOUNTER — Telehealth: Payer: Self-pay | Admitting: Family Medicine

## 2012-12-18 NOTE — Telephone Encounter (Signed)
Patient Information:  Caller Name: [name]  Phone: [phone]  Patient: [name], [name]  Gender: Female  DOB: 1946/09/15  Age: 67 Years  PCP: Alysia Penna [facility]  Office Follow Up:  Does the office need to follow up with this patient?: Yes  Instructions For The Office: Patient wants to "taper up on Neurontin for shingles.  RN Note:  Patient states shingles are not worse.  She was told Dr. Sarajane Jews would increase does of Neurontin if she needed him too.  H&P states that "can taper up if we need too".  Please contact patient  Symptoms  Reason For Call & Symptoms: Patient states she was given Neurontin for Shingles.  Neurontin 100mg  tid.  She states the medication is helping but wants to increase the dosage because "it does not knock the pain completely out"  She does not like the Vioden because it makes her sleep. She wants increase in Neurontin.  Reviewed Health History In EMR: Yes  Reviewed Medications In EMR: Yes  Reviewed Allergies In EMR: Yes  Reviewed Surgeries / Procedures: No  Date of Onset of Symptoms: 09/02/2012  Treatments Tried: Hydrocodone/vicoden.   Neurontin  Treatments Tried Worked: Yes  Guideline(s) Used:  No Protocol Available - Sick Adult  Disposition Per Guideline:   Callback by PCP Today  Reason For Disposition Reached:   Nursing judgment  Advice Given:  N/A

## 2012-12-19 NOTE — Telephone Encounter (Signed)
Change Gabapentin to 300 mg tid, call in #[phone] with 5 rf

## 2012-12-22 MED ORDER — GABAPENTIN 300 MG PO CAPS: 300.0000 mg | ORAL_CAPSULE | Freq: Three times a day (TID) | ORAL | Status: DC

## 2012-12-22 NOTE — Telephone Encounter (Signed)
I sent script e-scribe and spoke with pt. 

## 2013-07-05 ENCOUNTER — Emergency Department (HOSPITAL_COMMUNITY): Payer: Medicare Other

## 2013-07-05 ENCOUNTER — Emergency Department (HOSPITAL_COMMUNITY)
Admission: EM | Admit: 2013-07-05 | Discharge: 2013-07-05 | Disposition: A | Discharge status: Home / Self Care | Payer: Medicare Other | Attending: Emergency Medicine | Admitting: Emergency Medicine

## 2013-07-05 ENCOUNTER — Encounter (HOSPITAL_COMMUNITY): Payer: Self-pay | Admitting: Emergency Medicine

## 2013-07-05 DIAGNOSIS — M7989 Other specified soft tissue disorders: Secondary | ICD-10-CM | POA: Insufficient documentation

## 2013-07-05 DIAGNOSIS — Z79899 Other long term (current) drug therapy: Secondary | ICD-10-CM | POA: Insufficient documentation

## 2013-07-05 DIAGNOSIS — I10 Essential (primary) hypertension: Secondary | ICD-10-CM | POA: Insufficient documentation

## 2013-07-05 DIAGNOSIS — F17200 Nicotine dependence, unspecified, uncomplicated: Secondary | ICD-10-CM | POA: Insufficient documentation

## 2013-07-05 DIAGNOSIS — N390 Urinary tract infection, site not specified: Secondary | ICD-10-CM | POA: Insufficient documentation

## 2013-07-05 LAB — CBC WITH DIFFERENTIAL/PLATELET
Basophils Absolute: 0 10*3/uL (ref 0.0–0.1)
Basophils Relative: 1 % (ref 0–1)
Eosinophils Absolute: 0.2 10*3/uL (ref 0.0–0.7)
Eosinophils Relative: 2 % (ref 0–5)
HCT: 42.8 % (ref 36.0–46.0)
Hemoglobin: 14.6 g/dL (ref 12.0–15.0)
Lymphocytes Relative: 38 % (ref 12–46)
Lymphs Abs: 3 10*3/uL (ref 0.7–4.0)
MCH: 32.1 pg (ref 26.0–34.0)
MCHC: 34.1 g/dL (ref 30.0–36.0)
MCV: 94.1 fL (ref 78.0–100.0)
Monocytes Absolute: 1.1 10*3/uL — ABNORMAL HIGH (ref 0.1–1.0)
Monocytes Relative: 13 % — ABNORMAL HIGH (ref 3–12)
Neutro Abs: 3.7 10*3/uL (ref 1.7–7.7)
Neutrophils Relative %: 46 % (ref 43–77)
Platelets: 233 10*3/uL (ref 150–400)
RBC: 4.55 MIL/uL (ref 3.87–5.11)
RDW: 14.9 % (ref 11.5–15.5)
WBC: 8 10*3/uL (ref 4.0–10.5)

## 2013-07-05 LAB — BASIC METABOLIC PANEL
BUN: 10 mg/dL (ref 6–23)
CO2: 26 mEq/L (ref 19–32)
Calcium: 10.1 mg/dL (ref 8.4–10.5)
Chloride: 103 mEq/L (ref 96–112)
Creatinine, Ser: 0.58 mg/dL (ref 0.50–1.10)
GFR calc Af Amer: >90 mL/min (ref >90)
GFR calc non Af Amer: >90 mL/min (ref >90)
Glucose, Bld: 95 mg/dL (ref 70–99)
Potassium: 3.8 mEq/L (ref 3.5–5.1)
Sodium: 140 mEq/L (ref 135–145)

## 2013-07-05 LAB — URINALYSIS, ROUTINE W REFLEX MICROSCOPIC
Bilirubin Urine: NEGATIVE
Glucose, UA: NEGATIVE mg/dL
Ketones, ur: NEGATIVE mg/dL
Nitrite: NEGATIVE
Protein, ur: NEGATIVE mg/dL
Specific Gravity, Urine: 1.014 (ref 1.005–1.030)
Urobilinogen, UA: 0.2 mg/dL (ref 0.0–1.0)
pH: 6 (ref 5.0–8.0)

## 2013-07-05 LAB — URINE MICROSCOPIC-ADD ON

## 2013-07-05 MED ORDER — CEPHALEXIN 500 MG PO CAPS: 500.0000 mg | ORAL_CAPSULE | Freq: Four times a day (QID) | ORAL | Status: DC

## 2013-07-05 NOTE — ED Notes (Signed)
Pt c/o low back pain x 2 weeks, increased Thursday. Pt denies recent injury. Pt reports increase urination.

## 2013-07-05 NOTE — ED Provider Notes (Signed)
[name] is a 67 y.o. female who complains of urinary frequency for 1 month, worsening the last several days. The frequency, occurs more at nighttime. She's also had low back pain, right-sided, for several days. She denies fever, chills, nausea, vomiting. She has a history of hematuria evaluated with CT scan and cystoscopy, one year ago. She reports that no etiology was discovered.  Exam alert, elderly, female in mild distress. Back nontender to palpation. Abdomen soft and nontender  Assessment: Urinary frequency with mildly abnormal urinalysis. CT imaging required to rule out renal pathology or renal/ ureteral stones.   Plan: As per associated documentation  Medical screening examination/treatment/procedure(s) were conducted as a shared visit with non-physician practitioner(s) and myself.  I personally evaluated the patient during the encounter  Richarda Blade, MD 07/05/13 1515

## 2013-07-05 NOTE — Progress Notes (Signed)
VASCULAR LAB PRELIMINARY  PRELIMINARY  PRELIMINARY  PRELIMINARY  Right lower extremity venous Doppler completed.    Preliminary report:  There is no DVT or SVT noted in the right lower extremity.  Clayvon Parlett, RVT 07/05/2013, 10:36 AM

## 2013-07-05 NOTE — ED Provider Notes (Signed)
CSN: [id number]     Arrival date & time 07/05/13  [id number] History     First MD Initiated Contact with Patient 07/05/13 [phone]     Chief Complaint  Patient presents with  . Back Pain   (Consider location/radiation/quality/duration/timing/severity/associated sxs/prior Treatment) HPI Comments: Pt presents to the ED with a history of lower back pain and increased urination over the last 4-5 days. The back pain is described as a dull ache, constant, with no radiation. She reports that her urine has a foul odor. She denies fever, nausea, vomiting, diarrhea, constipation, dysuria, or hematuria. No recent history of back injury. She was evaluated by a urologist one year ago for urinary issues. She has also noticed increased swelling of her right foot and calf over last 1-2 days. The leg is not painful or erythematous. She has a history of T2DM and hypertension, managed with medications. She is a current every day smoker. No recent surgeries, travel, or immobilization. She does not take exogenous estrogen.   Patient is a 67 y.o. female presenting with back pain.  Back Pain   Past Medical History  Diagnosis Date  . Hypertension    Past Surgical History  Procedure Laterality Date  . Joint replacement  7-09    Total Knee Dr Noemi Chapel  . Tympanostomy tube placement  2003    Dr Thornell Mule   Family History  Problem Relation Age of Onset  . Diabetes    . Hyperlipidemia    . Hypertension     History  Substance Use Topics  . Smoking status: Current Every Day Smoker -- 0.50 packs/day    Types: Cigarettes  . Smokeless tobacco: Never Used  . Alcohol Use: No   OB History   Grav Para Term Preterm Abortions TAB SAB Ect Mult Living                 Review of Systems  Musculoskeletal: Positive for back pain.    Allergies  Review of patient's allergies indicates no known allergies.  Home Medications   Current Outpatient Rx  Name  Route  Sig  Dispense  Refill  . amLODipine-olmesartan (AZOR) 10-40 MG  per tablet   Oral   Take 1 tablet by mouth daily.   30 tablet   11   . gabapentin (NEURONTIN) 300 MG capsule   Oral   Take 1 capsule (300 mg total) by mouth 3 (three) times daily.   90 capsule   5   . metFORMIN (GLUCOPHAGE) 500 MG tablet   Oral   Take 1 tablet (500 mg total) by mouth 2 (two) times daily with a meal.   60 tablet   11   . pravastatin (PRAVACHOL) 40 MG tablet   Oral   Take 1 tablet (40 mg total) by mouth every evening.   30 tablet   11    Ht 5\' 5"  (1.651 m)  Wt 230 lb (104.327 kg)  BMI 38.27 kg/m2 Physical Exam  Nursing note and vitals reviewed. Constitutional: She is oriented to person, place, and time. She appears well-developed and well-nourished. No distress.  HENT:  Head: Normocephalic and atraumatic.  Mouth/Throat: Oropharynx is clear and moist.  Eyes: Pupils are equal, round, and reactive to light.  Neck: Normal range of motion. Neck supple.  Cardiovascular: Normal rate, regular rhythm and normal heart sounds.  Exam reveals no gallop and no friction rub.   No murmur heard. Pulmonary/Chest: Effort normal and breath sounds normal. No respiratory distress.  Abdominal: Normal appearance and  bowel sounds are normal. There is no tenderness. There is no rigidity, no rebound and no guarding. No hernia.  Musculoskeletal:       Lumbar back: Normal. She exhibits normal range of motion, no tenderness and no bony tenderness.  Neurological: She is alert and oriented to person, place, and time. Coordination normal.  Skin: Skin is warm and dry.    ED Course   Procedures (including critical care time)  Labs Reviewed  CBC WITH DIFFERENTIAL  BASIC METABOLIC PANEL  URINALYSIS, ROUTINE W REFLEX MICROSCOPIC   Based on the patient's history of present illness, and physical exam she'll be treated for UTI.  She does have white blood cells noted in her urine.  The patient is asked to follow up with her primary care Dr. for recheck.  She is told to return here as  needed.  She is also advised to increase her fluid intake  MDM  MDM Reviewed: vitals, nursing note and previous chart Interpretation: labs and CT scan      Brent General, PA-C 07/05/13 1432

## 2013-07-12 ENCOUNTER — Other Ambulatory Visit: Payer: Self-pay | Admitting: Family Medicine

## 2013-09-07 ENCOUNTER — Ambulatory Visit (INDEPENDENT_AMBULATORY_CARE_PROVIDER_SITE_OTHER): Payer: Medicare Other | Admitting: Family Medicine

## 2013-09-07 ENCOUNTER — Encounter: Payer: Self-pay | Admitting: Family Medicine

## 2013-09-07 VITALS — BP 180/80 | HR 83 | Temp 98.0°F | Wt 230.0 lb

## 2013-09-07 DIAGNOSIS — I10 Essential (primary) hypertension: Secondary | ICD-10-CM

## 2013-09-07 DIAGNOSIS — Z23 Encounter for immunization: Secondary | ICD-10-CM

## 2013-09-07 DIAGNOSIS — E119 Type 2 diabetes mellitus without complications: Secondary | ICD-10-CM

## 2013-09-07 DIAGNOSIS — E782 Mixed hyperlipidemia: Secondary | ICD-10-CM

## 2013-09-07 MED ORDER — METFORMIN HCL 500 MG PO TABS: ORAL_TABLET | ORAL | Status: DC

## 2013-09-07 MED ORDER — AMLODIPINE-OLMESARTAN 10-40 MG PO TABS: ORAL_TABLET | ORAL | Status: DC

## 2013-09-07 MED ORDER — DICLOFENAC SODIUM 75 MG PO TBEC: 75.0000 mg | DELAYED_RELEASE_TABLET | Freq: Two times a day (BID) | ORAL | Status: DC

## 2013-09-07 MED ORDER — PRAVASTATIN SODIUM 40 MG PO TABS: 40.0000 mg | ORAL_TABLET | Freq: Every evening | ORAL | Status: DC

## 2013-09-07 NOTE — Addendum Note (Signed)
Addended by: Aggie Hacker A on: 09/07/2013 03:14 PM   Modules accepted: Orders

## 2013-09-07 NOTE — Progress Notes (Signed)
  Subjective:    Patient ID: [name], female    DOB: 10/23/1946, 67 y.o.   MRN: [id number]  HPI Here to follow up. She is doing well. Her BP is stable at home although she has been stressed lately with family health issues.    Review of Systems  Constitutional: Negative.   Respiratory: Negative.   Cardiovascular: Negative.        Objective:   Physical Exam  Constitutional: She appears well-developed and well-nourished.  Cardiovascular: Normal rate, regular rhythm, normal heart sounds and intact distal pulses.   Pulmonary/Chest: Effort normal and breath sounds normal.          Assessment & Plan:  Refilled meds.

## 2014-02-17 ENCOUNTER — Encounter: Payer: Self-pay | Admitting: Family Medicine

## 2014-02-18 ENCOUNTER — Encounter: Payer: Self-pay | Admitting: Family Medicine

## 2014-04-29 ENCOUNTER — Telehealth: Payer: Self-pay | Admitting: Family Medicine

## 2014-04-29 MED ORDER — METFORMIN HCL 500 MG PO TABS: ORAL_TABLET | ORAL | Status: DC

## 2014-04-29 NOTE — Telephone Encounter (Signed)
Requested change to 90 day refills

## 2014-06-04 ENCOUNTER — Telehealth: Payer: Self-pay | Admitting: Family Medicine

## 2014-06-04 NOTE — Telephone Encounter (Signed)
lmom for pt to cb. Pt needs diabetic bundle a1c,ldl and also follow appt with  md for dm

## 2014-06-17 ENCOUNTER — Other Ambulatory Visit: Payer: Self-pay | Admitting: Family Medicine

## 2014-06-17 DIAGNOSIS — E782 Mixed hyperlipidemia: Secondary | ICD-10-CM

## 2014-06-17 DIAGNOSIS — E119 Type 2 diabetes mellitus without complications: Secondary | ICD-10-CM

## 2014-06-30 NOTE — Telephone Encounter (Signed)
Left message on machine for patient to return our call to schedule an appointment to follow up with diabetes Lipid, a1c, bmet, micro albumin ordered Diabetic bundle

## 2014-07-08 NOTE — Telephone Encounter (Signed)
lmom for pt to cb

## 2014-07-29 NOTE — Telephone Encounter (Signed)
lmom for pt tot cb

## 2014-09-15 ENCOUNTER — Other Ambulatory Visit: Payer: Self-pay | Admitting: Family Medicine

## 2014-09-17 ENCOUNTER — Telehealth: Payer: Self-pay | Admitting: Family Medicine

## 2014-09-17 MED ORDER — AMLODIPINE-OLMESARTAN 10-40 MG PO TABS: ORAL_TABLET | ORAL | Status: DC

## 2014-09-17 MED ORDER — DICLOFENAC SODIUM 75 MG PO TBEC: DELAYED_RELEASE_TABLET | ORAL | Status: DC

## 2014-09-17 NOTE — Telephone Encounter (Signed)
rx sent in electronically 

## 2014-09-17 NOTE — Telephone Encounter (Signed)
Pt states she has an appt with Dr. Sarajane Jews on 10/11/14 but has run out of the following meds:  amLODipine-olmesartan (AZOR) 10-40 MG per tablet diclofenac (VOLTAREN) 75 MG EC tablet  Pt requesting temporary supply of medication to be called in to [facility] [address] until she comes in for her appt.

## 2014-10-11 ENCOUNTER — Ambulatory Visit (INDEPENDENT_AMBULATORY_CARE_PROVIDER_SITE_OTHER): Payer: Medicare Other | Admitting: Family Medicine

## 2014-10-11 ENCOUNTER — Encounter: Payer: Self-pay | Admitting: Family Medicine

## 2014-10-11 ENCOUNTER — Telehealth: Payer: Self-pay | Admitting: Family Medicine

## 2014-10-11 VITALS — Temp 98.2°F | Ht 65.0 in | Wt 217.0 lb

## 2014-10-11 DIAGNOSIS — I10 Essential (primary) hypertension: Secondary | ICD-10-CM

## 2014-10-11 DIAGNOSIS — Z23 Encounter for immunization: Secondary | ICD-10-CM

## 2014-10-11 DIAGNOSIS — Z0000 Encounter for general adult medical examination without abnormal findings: Secondary | ICD-10-CM

## 2014-10-11 DIAGNOSIS — E785 Hyperlipidemia, unspecified: Secondary | ICD-10-CM

## 2014-10-11 DIAGNOSIS — E119 Type 2 diabetes mellitus without complications: Secondary | ICD-10-CM

## 2014-10-11 LAB — CBC WITH DIFFERENTIAL/PLATELET
BASOS ABS: 0 10*3/uL (ref 0.0–0.1)
BASOS PCT: 0.6 % (ref 0.0–3.0)
EOS PCT: 0.8 % (ref 0.0–5.0)
Eosinophils Absolute: 0.1 10*3/uL (ref 0.0–0.7)
HCT: 46.2 % — ABNORMAL HIGH (ref 36.0–46.0)
HEMOGLOBIN: 15.1 g/dL — AB (ref 12.0–15.0)
LYMPHS PCT: 31.4 % (ref 12.0–46.0)
Lymphs Abs: 2.3 10*3/uL (ref 0.7–4.0)
MCHC: 32.7 g/dL (ref 30.0–36.0)
MCV: 95.6 fl (ref 78.0–100.0)
MONOS PCT: 10 % (ref 3.0–12.0)
Monocytes Absolute: 0.7 10*3/uL (ref 0.1–1.0)
NEUTROS ABS: 4.2 10*3/uL (ref 1.4–7.7)
Neutrophils Relative %: 57.2 % (ref 43.0–77.0)
Platelets: 248 10*3/uL (ref 150.0–400.0)
RBC: 4.83 Mil/uL (ref 3.87–5.11)
RDW: 14.6 % (ref 11.5–15.5)
WBC: 7.3 10*3/uL (ref 4.0–10.5)

## 2014-10-11 LAB — POCT URINALYSIS DIPSTICK
BILIRUBIN UA: NEGATIVE
Blood, UA: NEGATIVE
GLUCOSE UA: NEGATIVE
Nitrite, UA: NEGATIVE
Spec Grav, UA: >=1.03
Urobilinogen, UA: 0.2
pH, UA: 5.5

## 2014-10-11 LAB — MICROALBUMIN / CREATININE URINE RATIO
CREATININE, U: 208.6 mg/dL
MICROALB/CREAT RATIO: 10.6 mg/g (ref 0.0–30.0)
Microalb, Ur: 22.1 mg/dL — ABNORMAL HIGH (ref 0.0–1.9)

## 2014-10-11 LAB — HEMOGLOBIN A1C: Hgb A1c MFr Bld: 6.2 % (ref 4.6–6.5)

## 2014-10-11 MED ORDER — DICLOFENAC SODIUM 75 MG PO TBEC: DELAYED_RELEASE_TABLET | ORAL | Status: DC

## 2014-10-11 MED ORDER — LISINOPRIL 10 MG PO TABS: 10.0000 mg | ORAL_TABLET | Freq: Every day | ORAL | Status: DC

## 2014-10-11 MED ORDER — PRAVASTATIN SODIUM 40 MG PO TABS: 40.0000 mg | ORAL_TABLET | Freq: Every evening | ORAL | Status: DC

## 2014-10-11 MED ORDER — METFORMIN HCL 500 MG PO TABS: ORAL_TABLET | ORAL | Status: DC

## 2014-10-11 MED ORDER — AMLODIPINE BESYLATE 10 MG PO TABS: 10.0000 mg | ORAL_TABLET | Freq: Every day | ORAL | Status: DC

## 2014-10-11 NOTE — Progress Notes (Signed)
Pre visit review using our clinic review tool, if applicable. No additional management support is needed unless otherwise documented below in the visit note. 

## 2014-10-11 NOTE — Telephone Encounter (Signed)
emmi mailed  °

## 2014-10-11 NOTE — Progress Notes (Signed)
   Subjective:    Patient ID: [name], female    DOB: 1946-01-01, 68 y.o.   MRN: [id number]  HPI 68 yr old female for a cpx. She feels well but she had to stop taking her BP pill one month ago because it made her urinate too much. She feels better now that she is no longer taking it. She does not check her glucoses.    Review of Systems  Constitutional: Negative.   HENT: Negative.   Eyes: Negative.   Respiratory: Negative.   Cardiovascular: Negative.   Gastrointestinal: Negative.   Genitourinary: Negative for dysuria, urgency, frequency, hematuria, flank pain, decreased urine volume, enuresis, difficulty urinating, pelvic pain and dyspareunia.  Musculoskeletal: Negative.   Skin: Negative.   Neurological: Negative.   Psychiatric/Behavioral: Negative.        Objective:   Physical Exam  Constitutional: She is oriented to person, place, and time. She appears well-developed and well-nourished. No distress.  HENT:  Head: Normocephalic and atraumatic.  Right Ear: External ear normal.  Left Ear: External ear normal.  Nose: Nose normal.  Mouth/Throat: Oropharynx is clear and moist. No oropharyngeal exudate.  Eyes: Conjunctivae and EOM are normal. Pupils are equal, round, and reactive to light. No scleral icterus.  Neck: Normal range of motion. Neck supple. No JVD present. No thyromegaly present.  Cardiovascular: Normal rate, regular rhythm, normal heart sounds and intact distal pulses.  Exam reveals no gallop and no friction rub.   EKG normal. She has her usual 2/6 SM at the left sternal border  Pulmonary/Chest: Effort normal and breath sounds normal. No respiratory distress. She has no wheezes. She has no rales. She exhibits no tenderness.  Abdominal: Soft. Bowel sounds are normal. She exhibits no distension and no mass. There is no tenderness. There is no rebound and no guarding.  Musculoskeletal: Normal range of motion. She exhibits no edema or tenderness.  Lymphadenopathy:      She has no cervical adenopathy.  Neurological: She is alert and oriented to person, place, and time. She has normal reflexes. No cranial nerve deficit. She exhibits normal muscle tone. Coordination normal.  Skin: Skin is warm and dry. No rash noted. No erythema.  Psychiatric: She has a normal mood and affect. Her behavior is normal. Judgment and thought content normal.          Assessment & Plan:  Well exam. Get fasting labs today. Get on Amlodidpine 10 mg daily and and add Lisinopril 10 mg daily.

## 2014-10-12 ENCOUNTER — Telehealth: Payer: Self-pay | Admitting: Family Medicine

## 2014-10-12 LAB — LIPID PANEL
CHOL/HDL RATIO: 5
Cholesterol: 319 mg/dL — ABNORMAL HIGH (ref 0–200)
HDL: 68.2 mg/dL (ref >39.00)
LDL CALC: 222 mg/dL — AB (ref 0–99)
NONHDL: 250.8
TRIGLYCERIDES: 146 mg/dL (ref 0.0–149.0)
VLDL: 29.2 mg/dL (ref 0.0–40.0)

## 2014-10-12 LAB — BASIC METABOLIC PANEL
BUN: 21 mg/dL (ref 6–23)
CO2: 19 mEq/L (ref 19–32)
CREATININE: 1 mg/dL (ref 0.4–1.2)
Calcium: 10.3 mg/dL (ref 8.4–10.5)
Chloride: 113 mEq/L — ABNORMAL HIGH (ref 96–112)
GFR: 72.62 mL/min (ref >60.00)
Glucose, Bld: 73 mg/dL (ref 70–99)
Potassium: 5 mEq/L (ref 3.5–5.1)
Sodium: 147 mEq/L — ABNORMAL HIGH (ref 135–145)

## 2014-10-12 LAB — HEPATIC FUNCTION PANEL
ALT: 14 U/L (ref 0–35)
AST: 22 U/L (ref 0–37)
Albumin: 4.3 g/dL (ref 3.5–5.2)
Alkaline Phosphatase: 69 U/L (ref 39–117)
BILIRUBIN DIRECT: 0 mg/dL (ref 0.0–0.3)
BILIRUBIN TOTAL: 0.4 mg/dL (ref 0.2–1.2)
Total Protein: 8.4 g/dL — ABNORMAL HIGH (ref 6.0–8.3)

## 2014-10-12 LAB — TSH: TSH: 1.27 u[IU]/mL (ref 0.35–4.50)

## 2014-10-12 NOTE — Telephone Encounter (Signed)
emmi mailed  °

## 2014-10-14 MED ORDER — ATORVASTATIN CALCIUM 40 MG PO TABS: 40.0000 mg | ORAL_TABLET | Freq: Every day | ORAL | Status: DC

## 2014-10-14 NOTE — Addendum Note (Signed)
Addended by: Aggie Hacker A on: 10/14/2014 08:13 AM   Modules accepted: Orders, Medications

## 2014-12-10 ENCOUNTER — Telehealth: Payer: Self-pay | Admitting: Family Medicine

## 2014-12-10 MED ORDER — AZITHROMYCIN 250 MG PO TABS: ORAL_TABLET | ORAL | Status: DC

## 2014-12-10 NOTE — Telephone Encounter (Signed)
I sent script e-scribe and spoke with pt. 

## 2014-12-10 NOTE — Telephone Encounter (Signed)
Pt states she has a head cold, and a chest cold and a bad cough.  Pt has coughed so much she can't seem to stop. Would like to know if you will call in zpac and cough med. [email or web address]

## 2014-12-10 NOTE — Telephone Encounter (Signed)
Call in a [place]. We cannot call in cough meds. I suggest she use Delsym

## 2015-01-25 ENCOUNTER — Encounter: Payer: Self-pay | Admitting: Family Medicine

## 2015-01-25 ENCOUNTER — Ambulatory Visit (INDEPENDENT_AMBULATORY_CARE_PROVIDER_SITE_OTHER): Payer: Medicare Other | Admitting: Family Medicine

## 2015-01-25 VITALS — BP 172/74 | HR 68 | Temp 98.2°F | Ht 65.0 in | Wt 225.0 lb

## 2015-01-25 DIAGNOSIS — H6983 Other specified disorders of Eustachian tube, bilateral: Secondary | ICD-10-CM | POA: Diagnosis not present

## 2015-01-25 DIAGNOSIS — J0100 Acute maxillary sinusitis, unspecified: Secondary | ICD-10-CM | POA: Diagnosis not present

## 2015-01-25 MED ORDER — HYDROCODONE-HOMATROPINE 5-1.5 MG/5ML PO SYRP: 5.0000 mL | ORAL_SOLUTION | ORAL | Status: DC | PRN

## 2015-01-25 MED ORDER — AMOXICILLIN-POT CLAVULANATE 875-125 MG PO TABS: 1.0000 | ORAL_TABLET | Freq: Two times a day (BID) | ORAL | Status: DC

## 2015-01-25 NOTE — Progress Notes (Signed)
   Subjective:    Patient ID: [name], female    DOB: 08-Mar-1946, 69 y.o.   MRN: [id number]  HPI Here for 6 weeks of sinus pressure, PND, and coughing up yellow sputum. No fever. She took a Zpack last months for this but it did not help. She also describes pressure and mild pain in both ears for the past 3 months. She had T tubes placed in 2003.    Review of Systems  Constitutional: Negative.   HENT: Positive for congestion, ear pain, postnasal drip and sinus pressure. Negative for ear discharge.   Eyes: Negative.   Respiratory: Positive for cough and chest tightness. Negative for shortness of breath and wheezing.        Objective:   Physical Exam  Constitutional: She appears well-developed and well-nourished.  HENT:  Nose: Nose normal.  Mouth/Throat: Oropharynx is clear and moist. No oropharyngeal exudate.  Both TMs show scarring but no tubes are present. No fluid or erythema  Eyes: Conjunctivae are normal.  Pulmonary/Chest: Effort normal and breath sounds normal.  Lymphadenopathy:    She has no cervical adenopathy.          Assessment & Plan:  Given Augmentin for the sinusitis. Add Mucinex. Refer back to Dr. Thornell Mule to check her ears

## 2015-01-25 NOTE — Progress Notes (Signed)
Pre visit review using our clinic review tool, if applicable. No additional management support is needed unless otherwise documented below in the visit note. 

## 2015-02-08 ENCOUNTER — Other Ambulatory Visit: Payer: Self-pay

## 2015-02-08 MED ORDER — ATORVASTATIN CALCIUM 40 MG PO TABS: 40.0000 mg | ORAL_TABLET | Freq: Every day | ORAL | Status: DC

## 2015-02-08 NOTE — Telephone Encounter (Signed)
Rx request for Lipitor 40 mg tablet-Take 1 tablet by mouth once daily #90  Pharm:  [facility]   Pls advise.

## 2015-02-10 ENCOUNTER — Other Ambulatory Visit: Payer: Self-pay | Admitting: *Deleted

## 2015-02-10 MED ORDER — ATORVASTATIN CALCIUM 40 MG PO TABS: 40.0000 mg | ORAL_TABLET | Freq: Every day | ORAL | Status: DC

## 2015-02-28 ENCOUNTER — Other Ambulatory Visit: Payer: Self-pay

## 2015-02-28 MED ORDER — LISINOPRIL 10 MG PO TABS: 10.0000 mg | ORAL_TABLET | Freq: Every day | ORAL | Status: DC

## 2015-03-28 DIAGNOSIS — H7201 Central perforation of tympanic membrane, right ear: Secondary | ICD-10-CM | POA: Diagnosis not present

## 2015-03-28 DIAGNOSIS — H7392 Unspecified disorder of tympanic membrane, left ear: Secondary | ICD-10-CM | POA: Diagnosis not present

## 2015-04-18 DIAGNOSIS — H6062 Unspecified chronic otitis externa, left ear: Secondary | ICD-10-CM | POA: Diagnosis not present

## 2015-04-18 DIAGNOSIS — H7392 Unspecified disorder of tympanic membrane, left ear: Secondary | ICD-10-CM | POA: Diagnosis not present

## 2015-04-18 DIAGNOSIS — H7201 Central perforation of tympanic membrane, right ear: Secondary | ICD-10-CM | POA: Diagnosis not present

## 2015-07-19 ENCOUNTER — Other Ambulatory Visit: Payer: Self-pay | Admitting: Family Medicine

## 2015-07-21 ENCOUNTER — Ambulatory Visit (INDEPENDENT_AMBULATORY_CARE_PROVIDER_SITE_OTHER): Payer: Medicare Other | Admitting: Family Medicine

## 2015-07-21 ENCOUNTER — Encounter: Payer: Self-pay | Admitting: Family Medicine

## 2015-07-21 VITALS — BP 192/107 | HR 82 | Temp 98.3°F | Ht 65.0 in | Wt 238.0 lb

## 2015-07-21 DIAGNOSIS — I10 Essential (primary) hypertension: Secondary | ICD-10-CM | POA: Diagnosis not present

## 2015-07-21 DIAGNOSIS — E782 Mixed hyperlipidemia: Secondary | ICD-10-CM

## 2015-07-21 DIAGNOSIS — E119 Type 2 diabetes mellitus without complications: Secondary | ICD-10-CM | POA: Diagnosis not present

## 2015-07-21 DIAGNOSIS — J209 Acute bronchitis, unspecified: Secondary | ICD-10-CM

## 2015-07-21 LAB — HEPATIC FUNCTION PANEL
ALT: 27 U/L (ref 0–35)
AST: 19 U/L (ref 0–37)
Albumin: 4 g/dL (ref 3.5–5.2)
Alkaline Phosphatase: 73 U/L (ref 39–117)
BILIRUBIN TOTAL: 0.4 mg/dL (ref 0.2–1.2)
Bilirubin, Direct: 0.1 mg/dL (ref 0.0–0.3)
Total Protein: 7.9 g/dL (ref 6.0–8.3)

## 2015-07-21 LAB — LIPID PANEL
CHOLESTEROL: 253 mg/dL — AB (ref 0–200)
HDL: 58.1 mg/dL (ref >39.00)
LDL Cholesterol: 170 mg/dL — ABNORMAL HIGH (ref 0–99)
NonHDL: 194.65
Total CHOL/HDL Ratio: 4
Triglycerides: 123 mg/dL (ref 0.0–149.0)
VLDL: 24.6 mg/dL (ref 0.0–40.0)

## 2015-07-21 LAB — HEMOGLOBIN A1C: Hgb A1c MFr Bld: 8.2 % — ABNORMAL HIGH (ref 4.6–6.5)

## 2015-07-21 MED ORDER — AMOXICILLIN-POT CLAVULANATE 875-125 MG PO TABS: 1.0000 | ORAL_TABLET | Freq: Two times a day (BID) | ORAL | Status: DC

## 2015-07-21 NOTE — Progress Notes (Signed)
   Subjective:    Patient ID: [name], female    DOB: [birth year], 69 y.o.   MRN: [id number]  HPI Here for one week of sinus pressure, PND, ST, and coughing up green sputum. No fever.    Review of Systems  Constitutional: Negative.   HENT: Positive for congestion, postnasal drip and sinus pressure.   Eyes: Negative.   Respiratory: Positive for cough and chest tightness.   Cardiovascular: Negative.        Objective:   Physical Exam  Constitutional: She appears well-developed and well-nourished.  HENT:  Right Ear: External ear normal.  Left Ear: External ear normal.  Nose: Nose normal.  Mouth/Throat: Oropharynx is clear and moist.  Eyes: Conjunctivae are normal.  Neck: No thyromegaly present.  Cardiovascular: Normal rate, regular rhythm, normal heart sounds and intact distal pulses.   Pulmonary/Chest: Effort normal. No respiratory distress. She has no wheezes. She has no rales.  Scattered rhonchi  Lymphadenopathy:    She has no cervical adenopathy.          Assessment & Plan:  Bronchitis, treat with Augmentin and Delsym. Get fasting labs today

## 2015-07-21 NOTE — Progress Notes (Signed)
Pre visit review using our clinic review tool, if applicable. No additional management support is needed unless otherwise documented below in the visit note. 

## 2015-07-27 DIAGNOSIS — Z1231 Encounter for screening mammogram for malignant neoplasm of breast: Secondary | ICD-10-CM | POA: Diagnosis not present

## 2015-07-27 LAB — HM MAMMOGRAPHY

## 2015-07-27 NOTE — Addendum Note (Signed)
Addended by: Aggie Hacker A on: 07/27/2015 10:13 AM   Modules accepted: Medications

## 2015-07-28 ENCOUNTER — Encounter: Payer: Self-pay | Admitting: Family Medicine

## 2015-08-18 ENCOUNTER — Telehealth: Payer: Self-pay | Admitting: Family Medicine

## 2015-08-18 NOTE — Telephone Encounter (Signed)
Please advise should patient be taking a calcium supplement?

## 2015-08-18 NOTE — Telephone Encounter (Signed)
Pt request refill of the following: metFORMIN (GLUCOPHAGE) 500 MG tablet  Pt said Dr Sarajane Jews wanted her to have some calcium medicine    Phamacy:  [facility]

## 2015-08-19 NOTE — Telephone Encounter (Signed)
Refill Metformin for one year. I do not recall talking about a calcium supplement with her, but in general I would recommend taking 1500 mg daily

## 2015-08-19 NOTE — Telephone Encounter (Signed)
Pt needs clarification on Amlodipine? Was there a change in this medication during last office visit?

## 2015-08-22 MED ORDER — METFORMIN HCL 1000 MG PO TABS: 1000.0000 mg | ORAL_TABLET | Freq: Two times a day (BID) | ORAL | Status: DC

## 2015-08-22 MED ORDER — ATORVASTATIN CALCIUM 80 MG PO TABS: 80.0000 mg | ORAL_TABLET | Freq: Every day | ORAL | Status: DC

## 2015-08-22 MED ORDER — LISINOPRIL 10 MG PO TABS: 20.0000 mg | ORAL_TABLET | Freq: Every day | ORAL | Status: DC

## 2015-08-22 NOTE — Telephone Encounter (Signed)
Per Dr. Sarajane Jews, the Lisinopril 10 mg was increased to 20 mg for 3 weeks and then pt will follow up. I sent in 3 scripts for pt Lisinopril, Metformin, & Lipitor. I spoke with pt and went over all of this information, pt did verbally understand all new changes.

## 2015-08-22 NOTE — Telephone Encounter (Signed)
Her amlodipine is already at 10 mg daily which is the maximum dose. We kept this the same

## 2015-08-29 ENCOUNTER — Telehealth: Payer: Self-pay | Admitting: Family Medicine

## 2015-08-29 NOTE — Telephone Encounter (Signed)
[facility] (Pharmacy)  Requesting a new script for a 90 day supply of metFORMIN (GLUCOPHAGE) 1000 MG tablet instead of a 30 day supply(that was sent in on 08/22/15) as insurance prefers 90 days supply.

## 2015-08-29 NOTE — Telephone Encounter (Signed)
Opened in error

## 2015-08-30 MED ORDER — METFORMIN HCL 1000 MG PO TABS: 1000.0000 mg | ORAL_TABLET | Freq: Two times a day (BID) | ORAL | Status: DC

## 2015-08-30 NOTE — Addendum Note (Signed)
Addended by: Colleen Can on: 08/30/2015 03:49 PM   Modules accepted: Orders, Medications

## 2015-08-30 NOTE — Telephone Encounter (Signed)
Rx resent to pharmacy

## 2015-10-17 ENCOUNTER — Ambulatory Visit (INDEPENDENT_AMBULATORY_CARE_PROVIDER_SITE_OTHER): Payer: Medicare Other | Admitting: Family Medicine

## 2015-10-17 ENCOUNTER — Encounter: Payer: Self-pay | Admitting: Family Medicine

## 2015-10-17 VITALS — BP 188/100 | Temp 98.4°F | Ht 65.0 in | Wt 234.0 lb

## 2015-10-17 DIAGNOSIS — E119 Type 2 diabetes mellitus without complications: Secondary | ICD-10-CM

## 2015-10-17 DIAGNOSIS — R55 Syncope and collapse: Secondary | ICD-10-CM

## 2015-10-17 DIAGNOSIS — E782 Mixed hyperlipidemia: Secondary | ICD-10-CM

## 2015-10-17 DIAGNOSIS — Z23 Encounter for immunization: Secondary | ICD-10-CM | POA: Diagnosis not present

## 2015-10-17 DIAGNOSIS — I10 Essential (primary) hypertension: Secondary | ICD-10-CM

## 2015-10-17 LAB — BASIC METABOLIC PANEL
BUN: 18 mg/dL (ref 6–23)
CALCIUM: 10.3 mg/dL (ref 8.4–10.5)
CO2: 28 mEq/L (ref 19–32)
Chloride: 102 mEq/L (ref 96–112)
Creatinine, Ser: 0.68 mg/dL (ref 0.40–1.20)
GFR: 110.38 mL/min (ref >60.00)
GLUCOSE: 168 mg/dL — AB (ref 70–99)
Potassium: 4 mEq/L (ref 3.5–5.1)
SODIUM: 138 meq/L (ref 135–145)

## 2015-10-17 LAB — TSH: TSH: 1.32 u[IU]/mL (ref 0.35–4.50)

## 2015-10-17 MED ORDER — METOPROLOL SUCCINATE ER 50 MG PO TB24: 50.0000 mg | ORAL_TABLET | Freq: Every day | ORAL | Status: DC

## 2015-10-17 MED ORDER — ASPIRIN EC 81 MG PO TBEC: 81.0000 mg | DELAYED_RELEASE_TABLET | Freq: Every day | ORAL | Status: DC

## 2015-10-17 NOTE — Addendum Note (Signed)
Addended by: Aggie Hacker A on: 10/17/2015 01:17 PM   Modules accepted: Orders

## 2015-10-17 NOTE — Progress Notes (Signed)
Pre visit review using our clinic review tool, if applicable. No additional management support is needed unless otherwise documented below in the visit note. 

## 2015-10-17 NOTE — Progress Notes (Signed)
   Subjective:    Patient ID: [name], female    DOB: 1946-03-09, 69 y.o.   MRN: [id number]  HPI Here for an episode of loss of consciousness which occurred at home on 10-13-15. She was home alone that morning and had been feeling fine. She had eaten a Spam sandwich in the mid morning, and she admits that she had not taken her medications yet. At appoximately 11:30 she was awakened by her nephew while lying on the kitchen floor. He had returned from running  an errand and she did not answer the door. He knew she was home so he crawled in through a window. She does not remember anything about falling but she apparently did because she bruised the right shoulder. She had apparently vomited and had some loose stool around her on the floor. After she got up, cleaned up, and sat down she felt back to normal, and she has felt fine ever since then. No HA or blurred vision, no chest pain or SOB. She has been running high BPs for the past few months.   Review of Systems  Constitutional: Negative.   Respiratory: Negative.   Cardiovascular: Negative.   Neurological: Positive for syncope. Negative for dizziness, tremors, seizures, facial asymmetry, speech difficulty, weakness, light-headedness, numbness and headaches.       Objective:   Physical Exam  Constitutional: She is oriented to person, place, and time. She appears well-developed and well-nourished. No distress.  Eyes: Conjunctivae and EOM are normal. Pupils are equal, round, and reactive to light.  Neck: Neck supple. No thyromegaly present.  Cardiovascular: Normal rate, regular rhythm and intact distal pulses.   Soft 1/6 SM at the left sternal border   Pulmonary/Chest: Effort normal and breath sounds normal. No respiratory distress. She has no wheezes. She has no rales.  Abdominal: Soft. Bowel sounds are normal. She exhibits no distension. There is no tenderness. There is no rebound and no guarding.  Lymphadenopathy:    She has no  cervical adenopathy.  Neurological: She is alert and oriented to person, place, and time. She has normal reflexes. No cranial nerve deficit. She exhibits normal muscle tone. Coordination normal.          Assessment & Plan:  Syncope in the face of poorly controlled HTN. We will add Metoprolol succinate 50 mg dialy to her regimen. Get labs today. Refer to Cardiology for the syncopal spell and for acclerated HTN. Set up an ECHO and carotid dopplers. Start on aspirin 81 mg daily.

## 2015-10-18 LAB — CBC WITH DIFFERENTIAL/PLATELET
BASOS ABS: 0 10*3/uL (ref 0.0–0.1)
Basophils Relative: 0.3 % (ref 0.0–3.0)
EOS ABS: 0.1 10*3/uL (ref 0.0–0.7)
Eosinophils Relative: 1.3 % (ref 0.0–5.0)
HEMATOCRIT: 43.5 % (ref 36.0–46.0)
Hemoglobin: 14.2 g/dL (ref 12.0–15.0)
LYMPHS PCT: 35.1 % (ref 12.0–46.0)
Lymphs Abs: 3 10*3/uL (ref 0.7–4.0)
MCHC: 32.7 g/dL (ref 30.0–36.0)
MCV: 95.1 fl (ref 78.0–100.0)
Monocytes Absolute: 0.9 10*3/uL (ref 0.1–1.0)
Monocytes Relative: 10.5 % (ref 3.0–12.0)
NEUTROS ABS: 4.5 10*3/uL (ref 1.4–7.7)
NEUTROS PCT: 52.8 % (ref 43.0–77.0)
PLATELETS: 233 10*3/uL (ref 150.0–400.0)
RBC: 4.57 Mil/uL (ref 3.87–5.11)
RDW: 14.7 % (ref 11.5–15.5)
WBC: 8.5 10*3/uL (ref 4.0–10.5)

## 2015-10-19 ENCOUNTER — Other Ambulatory Visit: Payer: Self-pay | Admitting: Family Medicine

## 2015-11-03 ENCOUNTER — Ambulatory Visit (INDEPENDENT_AMBULATORY_CARE_PROVIDER_SITE_OTHER): Payer: Medicare Other

## 2015-11-03 ENCOUNTER — Encounter: Payer: Self-pay | Admitting: Cardiology

## 2015-11-03 ENCOUNTER — Ambulatory Visit (INDEPENDENT_AMBULATORY_CARE_PROVIDER_SITE_OTHER): Payer: Medicare Other | Admitting: Cardiology

## 2015-11-03 VITALS — BP 192/86 | HR 56 | Ht 65.0 in | Wt 239.1 lb

## 2015-11-03 DIAGNOSIS — R55 Syncope and collapse: Secondary | ICD-10-CM | POA: Diagnosis not present

## 2015-11-03 DIAGNOSIS — Z79899 Other long term (current) drug therapy: Secondary | ICD-10-CM

## 2015-11-03 MED ORDER — SPIRONOLACTONE 25 MG PO TABS: 25.0000 mg | ORAL_TABLET | Freq: Every day | ORAL | Status: DC

## 2015-11-03 NOTE — Progress Notes (Signed)
Cardiology Office Note   Date:  11/03/2015   ID:  [name], [name] Dec 29, 1945, MRN [id number]  PCP:  Laurey Morale, MD  Cardiologist:   Minus Breeding, MD   Chief Complaint  Patient presents with  . Loss of Consciousness      History of Present Illness: [name] is a 69 y.o. female who presents for evaluation of syncope. She has no past cardiac history. 3 weeks ago she was at home. She was feeling well. A friend checked on her at 65. He came back at 1:30 and could not get her to answer the door. He broke through a window and found her on the floor. She had lost consciousness but doesn't know any of the details. She had lost bowel and bladder. There was apparently some vomiting. She had some bruising on her right shoulder. She did not go to the hospital. She otherwise felt well. She's never done this before. She hasn't done it since. She's had no further symptoms. She doesn't describe any palpitations, presyncope or syncope. She doesn't have any chest pressure, neck or arm discomfort.   Past Medical History  Diagnosis Date  . Hypertension   . 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 Acute bronchitis 07/22/2008  . Diabetes mellitus without complication ([place]) 9/37/3428    Past Surgical History  Procedure Laterality Date  . 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 Tympanostomy      Dr. Thornell Mule  . Neck surgery      Remove a tumor     Current Outpatient Prescriptions  Medication Sig Dispense Refill  . amLODipine (NORVASC) 10 MG tablet Take 1 tablet (10 mg total) by mouth daily. 30 tablet 11  . aspirin EC 81 MG tablet Take 1 tablet (81 mg total) by mouth daily. 1 tablet 0  . atorvastatin (LIPITOR) 80 MG tablet Take 1 tablet (80 mg total) by mouth daily. 30 tablet 11  . diclofenac (VOLTAREN) 75 MG EC tablet TAKE ONE TABLET BY MOUTH TWICE DAILY 60 tablet 0  . lisinopril (PRINIVIL,ZESTRIL) 10 MG tablet Take 2 tablets (20 mg total) by mouth daily. 60 tablet 11    . metFORMIN (GLUCOPHAGE) 1000 MG tablet Take 1 tablet (1,000 mg total) by mouth 2 (two) times daily with a meal. 180 tablet 3  . metoprolol succinate (TOPROL-XL) 50 MG 24 hr tablet Take 1 tablet (50 mg total) by mouth daily. Take with or immediately following a meal. 30 tablet 5  . spironolactone (ALDACTONE) 25 MG tablet Take 1 tablet (25 mg total) by mouth daily. 90 tablet 3   No current facility-administered medications for this visit.    Allergies:   Hydromet    Social History:  The patient  reports that she has been smoking Cigarettes.  She has been smoking about 0.50 packs per day. She has never used smokeless tobacco. She reports that she drinks alcohol. She reports that she does not use illicit drugs.   Family History:  The patient's family history includes Heart disease in her father.    ROS:  Please see the history of present illness.   Otherwise, review of systems are positive for none.   All other systems are reviewed and negative.    PHYSICAL EXAM: VS:  BP 192/86 mmHg  Pulse 56  Ht '5\' 5"'$  (1.651 m)  Wt 239 lb 1.6 oz (108.455 kg)  BMI 39.79 kg/m2 , BMI Body mass index is 39.79 kg/(m^2). GENERAL:  Well  appearing HEENT:  Pupils equal round and reactive, fundi not visualized, oral mucosa unremarkable NECK:  No jugular venous distention, waveform within normal limits, carotid upstroke brisk and symmetric, no bruits, no thyromegaly LYMPHATICS:  No cervical, inguinal adenopathy LUNGS:  Clear to auscultation bilaterally BACK:  No CVA tenderness CHEST:  Unremarkable HEART:  PMI not displaced or sustained,S1 and S2 within normal limits, no S3, no S4, no clicks, no rubs, 2 out of 6 brief apical systolic murmur radiating slightly out the aortic outflow tract in her best probably at the right upper sternal border, no diastolic murmurs ABD:  Flat, positive bowel sounds normal in frequency in pitch, no bruits, no rebound, no guarding, no midline pulsatile mass, no hepatomegaly, no  splenomegaly EXT:  2 plus pulses throughout, no edema, no cyanosis no clubbing SKIN:  No rashes no nodules NEURO:  Cranial nerves II through XII grossly intact, motor grossly intact throughout PSYCH:  Cognitively intact, oriented to person place and time    EKG:  EKG is ordered today. The ekg ordered today demonstrates sinus rhythm, rate 56, axis within normal limits, intervals within normal limits, nonspecific inferior T-wave flattening.   Recent Labs: 07/21/2015: ALT 27 10/17/2015: BUN 18; Creatinine, Ser 0.68; Hemoglobin 14.2; Platelets 233.0; Potassium 4.0; Sodium 138; TSH 1.32    Lipid Panel    Component Value Date/Time   CHOL 253* 07/21/2015 1053   TRIG 123.0 07/21/2015 1053   HDL 58.10 07/21/2015 1053   CHOLHDL 4 07/21/2015 1053   VLDL 24.6 07/21/2015 1053   LDLCALC 170* 07/21/2015 1053   LDLDIRECT 231.8 02/12/2011 0912      Wt Readings from Last 3 Encounters:  11/03/15 239 lb 1.6 oz (108.455 kg)  10/17/15 234 lb (106.142 kg)  07/21/15 238 lb (107.956 kg)      Other studies Reviewed: Additional studies/ records that were reviewed today include: None. Review of the above records demonstrates:  Please see elsewhere in the note.     ASSESSMENT AND PLAN:  SYNCOPE:  The patient did not have an orthostatic drop.  I will check an echocardiogram. I will check an event monitor. Further evaluation will be based on this. Consideration should also be given some neurologic workup. I do not suspect carotid stenosis as an etiology.  I will apply an event monitor.  [name] will need a 21 day event monitor.  The patients symptoms necessitate an event monitor.  The symptoms are too infrequent to be identified on a Holter monitor.    HTN:    Her blood pressure is not controlled although beta blockers were added about 3 weeks ago. I'm going to add spironolactone 25 mg daily. She can get a basic metabolic profile in about 10 days to make sure her potassium is okay.  MURMUR:   This will be evaluated as above.    Current medicines are reviewed at length with the patient today.  The patient does not have concerns regarding medicines.  The following changes have been made:  As above  Labs/ tests ordered today include:   Orders Placed This Encounter  Procedures  . Basic Metabolic Panel (BMET)  . Cardiac event monitor  . EKG 12-Lead  . ECHOCARDIOGRAM COMPLETE     Disposition:   FU with me after the event monitor.    Signed, Minus Breeding, MD  11/03/2015 12:14 PM    [facility]

## 2015-11-03 NOTE — Patient Instructions (Signed)
Your physician wants you to follow-up in: 3 Months. You will receive a reminder letter in the mail two months in advance. If you don't receive a letter, please call our office to schedule the follow-up appointment.  Your physician has requested that you have an echocardiogram. Echocardiography is a painless test that uses sound waves to create images of your heart. It provides your doctor with information about the size and shape of your heart and how well your heart's chambers and valves are working. This procedure takes approximately one hour. There are no restrictions for this procedure.  Your physician recommends that you return for lab work in: 10 days BMP  Your physician has recommended you make the following change in your medication: START Spironolactone 25 mg daily  Your physician has recommended that you wear an event monitor. Event monitors are medical devices that record the heart's electrical activity. Doctors most often [place] these monitors to diagnose arrhythmias. Arrhythmias are problems with the speed or rhythm of the heartbeat. The monitor is a small, portable device. You can wear one while you do your normal daily activities. This is usually used to diagnose what is causing palpitations/syncope (passing out). 21 days

## 2015-11-04 DIAGNOSIS — R55 Syncope and collapse: Secondary | ICD-10-CM | POA: Diagnosis not present

## 2015-11-23 ENCOUNTER — Other Ambulatory Visit (INDEPENDENT_AMBULATORY_CARE_PROVIDER_SITE_OTHER): Payer: Medicare Other | Admitting: *Deleted

## 2015-11-23 ENCOUNTER — Ambulatory Visit (HOSPITAL_COMMUNITY): Payer: Medicare Other | Attending: Cardiovascular Disease

## 2015-11-23 ENCOUNTER — Other Ambulatory Visit: Payer: Self-pay

## 2015-11-23 DIAGNOSIS — I059 Rheumatic mitral valve disease, unspecified: Secondary | ICD-10-CM | POA: Diagnosis not present

## 2015-11-23 DIAGNOSIS — I371 Nonrheumatic pulmonary valve insufficiency: Secondary | ICD-10-CM | POA: Diagnosis not present

## 2015-11-23 DIAGNOSIS — I517 Cardiomegaly: Secondary | ICD-10-CM | POA: Insufficient documentation

## 2015-11-23 DIAGNOSIS — R55 Syncope and collapse: Secondary | ICD-10-CM | POA: Diagnosis not present

## 2015-11-23 DIAGNOSIS — Z79899 Other long term (current) drug therapy: Secondary | ICD-10-CM | POA: Diagnosis not present

## 2015-11-23 LAB — BASIC METABOLIC PANEL
BUN: 24 mg/dL (ref 7–25)
CALCIUM: 9.9 mg/dL (ref 8.6–10.4)
CO2: 23 mmol/L (ref 20–31)
CREATININE: 0.9 mg/dL (ref 0.50–0.99)
Chloride: 104 mmol/L (ref 98–110)
GLUCOSE: 128 mg/dL — AB (ref 65–99)
Potassium: 4.1 mmol/L (ref 3.5–5.3)
Sodium: 138 mmol/L (ref 135–146)

## 2015-11-23 NOTE — Addendum Note (Signed)
Addended by: Eulis Foster on: 11/23/2015 10:55 AM   Modules accepted: Orders

## 2015-11-25 ENCOUNTER — Other Ambulatory Visit: Payer: Self-pay | Admitting: Family Medicine

## 2015-12-12 ENCOUNTER — Other Ambulatory Visit: Payer: Self-pay | Admitting: Family Medicine

## 2016-01-31 ENCOUNTER — Other Ambulatory Visit: Payer: Self-pay | Admitting: Family Medicine

## 2016-02-01 NOTE — Progress Notes (Signed)
Cardiology Office Note   Date:  02/02/2016   ID:  [name], DOB [birth year], MRN [id number]  PCP:  Laurey Morale, MD  Cardiologist:   Minus Breeding, MD   Chief Complaint  Patient presents with  . Loss of Consciousness      History of Present Illness: [name] is a 70 y.o. female who presents for evaluation of syncope. This was described in the previous note. Since I last saw her she's not had any further events. She's had no presyncope or syncope. She's felt well. She stopped smoking. Her blood pressure remains elevated. She tolerated spironolactone.   She stopped smoking!  She's been active doing her household chores. The patient denies any new symptoms such as chest discomfort, neck or arm discomfort. There has been no new shortness of breath, PND or orthopnea.   Abnormal echocardiogram demonstrated no significant abnormalities other than some mild pulmonary regurgitation and slightly elevated pulmonary pressures. Event monitor was unremarkable.   Past Medical History  Diagnosis Date  . Hypertension   . 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 Acute bronchitis 07/22/2008  . Diabetes mellitus without complication ([place]) 12/20/5807    Past Surgical History  Procedure Laterality Date  . 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 Tympanostomy      Dr. Thornell Mule  . Neck surgery      Remove a tumor     Current Outpatient Prescriptions  Medication Sig Dispense Refill  . amLODipine (NORVASC) 10 MG tablet TAKE ONE TABLET BY MOUTH ONCE DAILY 30 tablet 6  . atorvastatin (LIPITOR) 80 MG tablet Take 1 tablet (80 mg total) by mouth daily. 30 tablet 11  . diclofenac (VOLTAREN) 75 MG EC tablet TAKE ONE TABLET BY MOUTH TWICE DAILY 60 tablet 6  . lisinopril (PRINIVIL,ZESTRIL) 10 MG tablet Take 2 tablets (20 mg total) by mouth daily. 60 tablet 11  . metFORMIN (GLUCOPHAGE) 1000 MG tablet Take 1 tablet (1,000 mg total) by mouth 2 (two) times daily with a meal. 180 tablet 3    . metoprolol succinate (TOPROL-XL) 50 MG 24 hr tablet Take 1 tablet (50 mg total) by mouth daily. Take with or immediately following a meal. 30 tablet 5  . spironolactone (ALDACTONE) 25 MG tablet Take 1 tablet (25 mg total) by mouth daily. 90 tablet 3   No current facility-administered medications for this visit.    Allergies:   Hydromet    ROS:  Please see the history of present illness.   Otherwise, review of systems are positive for leg cramping.   All other systems are reviewed and negative.    PHYSICAL EXAM: VS:  BP 160/82 mmHg  Pulse 60  Ht '5\' 5"'$  (1.651 m)  Wt 238 lb 12.8 oz (108.319 kg)  BMI 39.74 kg/m2 , BMI Body mass index is 39.74 kg/(m^2). GENERAL:  Well appearing NECK:  No jugular venous distention, waveform within normal limits, carotid upstroke brisk and symmetric, no bruits, no thyromegaly LUNGS:  Clear to auscultation bilaterally BACK:  No CVA tenderness CHEST:  Unremarkable HEART:  PMI not displaced or sustained,S1 and S2 within normal limits, no S3, no S4, no clicks, no rubs, 2 out of 6 brief apical systolic murmur radiating slightly out the aortic outflow tract in her best probably at the right upper sternal border, no diastolic murmurs ABD:  Flat, positive bowel sounds normal in frequency in pitch, no bruits, no rebound, no guarding, no midline pulsatile mass, no hepatomegaly, no  splenomegaly EXT:  2 plus pulses throughout, no edema, no cyanosis no clubbing   EKG:  EKG is not ordered today.   Recent Labs: 07/21/2015: ALT 27 10/17/2015: Hemoglobin 14.2; Platelets 233.0; TSH 1.32 11/23/2015: BUN 24; Creat 0.90; Potassium 4.1; Sodium 138    Lipid Panel    Component Value Date/Time   CHOL 253* 07/21/2015 1053   TRIG 123.0 07/21/2015 1053   HDL 58.10 07/21/2015 1053   CHOLHDL 4 07/21/2015 1053   VLDL 24.6 07/21/2015 1053   LDLCALC 170* 07/21/2015 1053   LDLDIRECT 231.8 02/12/2011 0912      Wt Readings from Last 3 Encounters:  02/02/16 238 lb 12.8 oz  (108.319 kg)  11/03/15 239 lb 1.6 oz (108.455 kg)  10/17/15 234 lb (106.142 kg)      Other studies Reviewed: Additional studies/ records that were reviewed today include: None. Review of the above records demonstrates:  Please see elsewhere in the note.     ASSESSMENT AND PLAN:  SYNCOPE:  She had a negative cardiac work up and no further events.  No further work up is planned.    HTN:     Her blood pressure is still high.  She will keep a BP diary.  She does have BP cuff but has not started to use this.  For now she will continue the meds as listed.    MURMUR:  She has some mild pulmonary regurgitation.  However, this doesn't explain the murmur.   I will follow this clinically.    TOBACCO:  She quit smoking!!!    I congratulated her.     Current medicines are reviewed at length with the patient today.  The patient does not have concerns regarding medicines.  The following changes have been made:  As above  Labs/ tests ordered today include:   No orders of the defined types were placed in this encounter.     Disposition:   FU with me in four months.     Signed, Minus Breeding, MD  02/02/2016 1:02 PM    [facility]

## 2016-02-02 ENCOUNTER — Ambulatory Visit (INDEPENDENT_AMBULATORY_CARE_PROVIDER_SITE_OTHER): Payer: Medicare Other | Admitting: Cardiology

## 2016-02-02 ENCOUNTER — Encounter: Payer: Self-pay | Admitting: Cardiology

## 2016-02-02 VITALS — BP 160/82 | HR 60 | Ht 65.0 in | Wt 238.8 lb

## 2016-02-02 DIAGNOSIS — I10 Essential (primary) hypertension: Secondary | ICD-10-CM

## 2016-02-02 NOTE — Patient Instructions (Signed)
Dr Percival Spanish recommends that you schedule a follow-up appointment in 4 months. You will receive a reminder letter in the mail one months in advance. If you don't receive a letter, please call our office to schedule the follow-up appointment.  If you need a refill on your cardiac medications before your next appointment, please call your pharmacy.

## 2016-03-12 ENCOUNTER — Encounter (HOSPITAL_COMMUNITY): Payer: Self-pay | Admitting: Family Medicine

## 2016-04-05 ENCOUNTER — Other Ambulatory Visit: Payer: Self-pay | Admitting: Family Medicine

## 2016-04-05 MED ORDER — LISINOPRIL 10 MG PO TABS: 20.0000 mg | ORAL_TABLET | Freq: Every day | ORAL | Status: DC

## 2016-04-05 NOTE — Telephone Encounter (Signed)
Received a fax from the pharmacy to change rx to 90 day supply.   Dr. Sarajane Jews filled for 1 year on 08/22/15 Sent in a 90 day supply.

## 2016-07-23 ENCOUNTER — Ambulatory Visit (INDEPENDENT_AMBULATORY_CARE_PROVIDER_SITE_OTHER): Payer: Medicare Other | Admitting: Family Medicine

## 2016-07-23 ENCOUNTER — Encounter: Payer: Self-pay | Admitting: Family Medicine

## 2016-07-23 VITALS — BP 158/80 | HR 79 | Temp 98.4°F | Ht 65.0 in | Wt 235.0 lb

## 2016-07-23 DIAGNOSIS — E782 Mixed hyperlipidemia: Secondary | ICD-10-CM | POA: Diagnosis not present

## 2016-07-23 DIAGNOSIS — E119 Type 2 diabetes mellitus without complications: Secondary | ICD-10-CM | POA: Diagnosis not present

## 2016-07-23 DIAGNOSIS — I10 Essential (primary) hypertension: Secondary | ICD-10-CM

## 2016-07-23 LAB — HEPATIC FUNCTION PANEL
ALK PHOS: 69 U/L (ref 39–117)
ALT: 16 U/L (ref 0–35)
AST: 12 U/L (ref 0–37)
Albumin: 3.8 g/dL (ref 3.5–5.2)
BILIRUBIN TOTAL: 0.3 mg/dL (ref 0.2–1.2)
Bilirubin, Direct: 0.1 mg/dL (ref 0.0–0.3)
Total Protein: 7 g/dL (ref 6.0–8.3)

## 2016-07-23 LAB — LIPID PANEL
CHOL/HDL RATIO: 6
CHOLESTEROL: 340 mg/dL — AB (ref 0–200)
HDL: 53 mg/dL (ref >39.00)
NonHDL: 287.1
Triglycerides: 297 mg/dL — ABNORMAL HIGH (ref 0.0–149.0)
VLDL: 59.4 mg/dL — AB (ref 0.0–40.0)

## 2016-07-23 LAB — CBC WITH DIFFERENTIAL/PLATELET
BASOS PCT: 0.3 % (ref 0.0–3.0)
Basophils Absolute: 0 10*3/uL (ref 0.0–0.1)
EOS PCT: 1.1 % (ref 0.0–5.0)
Eosinophils Absolute: 0.1 10*3/uL (ref 0.0–0.7)
HCT: 35.1 % — ABNORMAL LOW (ref 36.0–46.0)
Hemoglobin: 11.7 g/dL — ABNORMAL LOW (ref 12.0–15.0)
LYMPHS ABS: 2.4 10*3/uL (ref 0.7–4.0)
Lymphocytes Relative: 29.9 % (ref 12.0–46.0)
MCHC: 33.3 g/dL (ref 30.0–36.0)
MCV: 93.1 fl (ref 78.0–100.0)
MONO ABS: 0.7 10*3/uL (ref 0.1–1.0)
MONOS PCT: 8.4 % (ref 3.0–12.0)
NEUTROS ABS: 4.8 10*3/uL (ref 1.4–7.7)
NEUTROS PCT: 60.3 % (ref 43.0–77.0)
PLATELETS: 231 10*3/uL (ref 150.0–400.0)
RBC: 3.77 Mil/uL — ABNORMAL LOW (ref 3.87–5.11)
RDW: 15 % (ref 11.5–15.5)
WBC: 8 10*3/uL (ref 4.0–10.5)

## 2016-07-23 LAB — BASIC METABOLIC PANEL
BUN: 17 mg/dL (ref 6–23)
CHLORIDE: 102 meq/L (ref 96–112)
CO2: 28 mEq/L (ref 19–32)
Calcium: 9.6 mg/dL (ref 8.4–10.5)
Creatinine, Ser: 0.79 mg/dL (ref 0.40–1.20)
GFR: 92.64 mL/min (ref >60.00)
Glucose, Bld: 255 mg/dL — ABNORMAL HIGH (ref 70–99)
POTASSIUM: 4.5 meq/L (ref 3.5–5.1)
SODIUM: 140 meq/L (ref 135–145)

## 2016-07-23 LAB — HEMOGLOBIN A1C: HEMOGLOBIN A1C: 10.8 % — AB (ref 4.6–6.5)

## 2016-07-23 LAB — TSH: TSH: 0.82 u[IU]/mL (ref 0.35–4.50)

## 2016-07-23 LAB — LDL CHOLESTEROL, DIRECT: Direct LDL: 231 mg/dL

## 2016-07-23 MED ORDER — METFORMIN HCL 1000 MG PO TABS: 500.0000 mg | ORAL_TABLET | Freq: Two times a day (BID) | ORAL | 3 refills | Status: DC

## 2016-07-23 MED ORDER — NEOMYCIN-POLYMYXIN-HC 3.5-10000-1 OT SOLN: 4.0000 [drp] | Freq: Four times a day (QID) | OTIC | 5 refills | Status: DC

## 2016-07-23 NOTE — Progress Notes (Signed)
   Subjective:    Patient ID: [name], female    DOB: May 16, 1946, 70 y.o.   MRN: [id number]  HPI Here to follow up. She complains of diarrhea which she blames on the Metformin. Her BP has been stable at home. She is fasting for ;labs.    Review of Systems  Constitutional: Negative.   Respiratory: Negative.   Cardiovascular: Negative.   Gastrointestinal: Positive for diarrhea. Negative for abdominal pain, anal bleeding, nausea, rectal pain and vomiting.  Neurological: Negative.        Objective:   Physical Exam  Constitutional: She is oriented to person, place, and time. She appears well-developed and well-nourished.  Neck: No thyromegaly present.  Cardiovascular: Normal rate, regular rhythm, normal heart sounds and intact distal pulses.   Pulmonary/Chest: Effort normal and breath sounds normal.  Musculoskeletal: She exhibits no edema.  Lymphadenopathy:    She has no cervical adenopathy.  Neurological: She is alert and oriented to person, place, and time.          Assessment & Plan:  Her HTN is stable. For the diabetes we will get labs today including an A1c. She will decrease the Metformin to 1/2 tab bid (500 mg), and hoepfull this will elminate the diarrhea.  Laurey Morale, MD

## 2016-07-23 NOTE — Progress Notes (Signed)
Pre visit review using our clinic review tool, if applicable. No additional management support is needed unless otherwise documented below in the visit note. 

## 2016-07-26 MED ORDER — GLIPIZIDE 10 MG PO TABS: 10.0000 mg | ORAL_TABLET | Freq: Two times a day (BID) | ORAL | 6 refills | Status: DC

## 2016-07-26 MED ORDER — ROSUVASTATIN CALCIUM 40 MG PO TABS: 40.0000 mg | ORAL_TABLET | Freq: Every day | ORAL | 11 refills | Status: DC

## 2016-07-26 NOTE — Addendum Note (Signed)
Addended by: Aggie Hacker A on: 07/26/2016 12:07 PM   Modules accepted: Orders

## 2016-07-27 ENCOUNTER — Telehealth: Payer: Self-pay | Admitting: Family Medicine

## 2016-07-27 MED ORDER — AZITHROMYCIN 250 MG PO TABS: ORAL_TABLET | ORAL | 0 refills | Status: DC

## 2016-07-27 NOTE — Telephone Encounter (Signed)
I sent script e-scribe to [facility] and spoke with pt.

## 2016-07-27 NOTE — Telephone Encounter (Signed)
Call in a [employer]  ?

## 2016-07-27 NOTE — Telephone Encounter (Signed)
Pt saw dr fry Monday and states now she has develpoed a bad cold, head stopped up. Pt declined appointment, wants dr fry to call in something to  [address]  Advised pt dr fry will be out of the office this afternoon, and pt states she hopes he can call in before he leaves.

## 2016-09-03 ENCOUNTER — Other Ambulatory Visit: Payer: Self-pay | Admitting: Family Medicine

## 2016-10-01 ENCOUNTER — Encounter: Payer: Self-pay | Admitting: Family Medicine

## 2016-10-01 DIAGNOSIS — Z1231 Encounter for screening mammogram for malignant neoplasm of breast: Secondary | ICD-10-CM | POA: Diagnosis not present

## 2016-10-22 ENCOUNTER — Other Ambulatory Visit: Payer: Self-pay | Admitting: Family Medicine

## 2016-11-23 ENCOUNTER — Other Ambulatory Visit: Payer: Self-pay | Admitting: Family Medicine

## 2016-11-23 ENCOUNTER — Other Ambulatory Visit: Payer: Self-pay | Admitting: Cardiology

## 2016-11-23 MED ORDER — METFORMIN HCL 1000 MG PO TABS: 500.0000 mg | ORAL_TABLET | Freq: Two times a day (BID) | ORAL | 3 refills | Status: DC

## 2016-11-28 ENCOUNTER — Ambulatory Visit (INDEPENDENT_AMBULATORY_CARE_PROVIDER_SITE_OTHER): Payer: Medicare Other

## 2016-11-28 VITALS — BP 128/78 | HR 63 | Ht 65.0 in | Wt 236.4 lb

## 2016-11-28 DIAGNOSIS — Z23 Encounter for immunization: Secondary | ICD-10-CM | POA: Diagnosis not present

## 2016-11-28 DIAGNOSIS — Z7289 Other problems related to lifestyle: Secondary | ICD-10-CM

## 2016-11-28 DIAGNOSIS — Z135 Encounter for screening for eye and ear disorders: Secondary | ICD-10-CM

## 2016-11-28 DIAGNOSIS — E2839 Other primary ovarian failure: Secondary | ICD-10-CM | POA: Diagnosis not present

## 2016-11-28 DIAGNOSIS — Z0000 Encounter for general adult medical examination without abnormal findings: Secondary | ICD-10-CM

## 2016-11-28 NOTE — Patient Instructions (Addendum)
Ms. Reinertsen , Thank you for taking time to come for your Medicare Wellness Visit. I appreciate your ongoing commitment to your health goals. Please review the following plan we discussed and let me know if I can assist you in the future.   Diabetes and weight loss; [facility]  Phone: [phone]   [facility]; [phone] may want to check on the senior centers  Sr. Line; [phone] Get resource to get information on any and all community programs for Seniors    Deaf & Hard of Hearing Division Services - can assist with hearing aid x 1  No reviews  [employer] Office  [place] [address]  [phone]  Will make eye referral   You will receive a box dropped at your door from [employer] It will be the cologuard test;  Educated to check with insurance regarding coverage of Shingles vaccination on Part D or Part B and may have lower co-pay if provided on the Part D side; has had shingles and does not want them again  There is a new vaccine coming out in 2018;  Stay tuned and will be more information this year   Will take the high does flu shot today  Please come back in for a nurse visit in 4 weeks to take your first pneumonia; (prevnar 13 )  Smoking; given list of current classes  Educated to avoid secondary smoke Smoking cessation in [place]: [place] at [phone] -day Smoking cessation in [place]: Evening; [phone] Smoking cessation at [facility]: [phone] Smoking cessation at [facility]: [place] offered a couple of times a month;  Will work with the patient as far as registration and location  Meds may help; chatix (Varenicline); Zyban (Bupropion SR); Nicotine Replacement (gum; lozenges; patches; etc.)   30 pack yr smoking hx: Educated regarding LDCT; To discuss with MD at next fup. Also educated on AAA screening for men 65-75 who have smoked     These are the goals we discussed: Goals    .  Weight (lb) < 200 lb (90.7 kg)          Cut back on portions and get eating under control           This is a list of the screening recommended for you and due dates:  Health Maintenance  Topic Date Due  .  Hepatitis C: One time screening is recommended by Center for Disease Control  (CDC) for  adults born from [birth year] through 1964.   Nov 11, 1946  . Complete foot exam   12/14/1956  . Eye exam for diabetics  12/14/1956  . Colon Cancer Screening  12/14/1996  . Shingles Vaccine  12/14/2006  . DEXA scan (bone density measurement)  12/15/2011  . Pneumonia vaccines (1 of 2 - PCV13) 12/15/2011  . Flu Shot  06/26/2016  . Hemoglobin A1C  01/23/2017  . Tetanus Vaccine  12/21/2017  . Mammogram  10/01/2018        Diabetes and Foot Care Diabetes may cause you to have problems because of poor blood supply (circulation) to your feet and legs. This may cause the skin on your feet to become thinner, break easier, and heal more slowly. Your skin may become dry, and the skin may peel and crack. You may also have nerve damage in your legs and feet causing decreased feeling in them. You may not notice minor injuries to your feet that could lead to infections or more serious problems. Taking  care of your feet is one of the most important things you can do for yourself. Follow these instructions at home:  Wear shoes at all times, even in the house. Do not go barefoot. Bare feet are easily injured.  Check your feet daily for blisters, cuts, and redness. If you cannot see the bottom of your feet, use a mirror or ask someone for help.  Wash your feet with warm water (do not use hot water) and mild soap. Then pat your feet and the areas between your toes until they are completely dry. Do not soak your feet as this can dry your skin.  Apply a moisturizing lotion or petroleum jelly (that does not contain alcohol and is unscented) to the skin on your feet and to dry, brittle toenails. Do not apply lotion  between your toes.  Trim your toenails straight across. Do not dig under them or around the cuticle. File the edges of your nails with an emery board or nail file.  Do not cut corns or calluses or try to remove them with medicine.  Wear clean socks or stockings every day. Make sure they are not too tight. Do not wear knee-high stockings since they may decrease blood flow to your legs.  Wear shoes that fit properly and have enough cushioning. To break in new shoes, wear them for just a few hours a day. This prevents you from injuring your feet. Always look in your shoes before you put them on to be sure there are no objects inside.  Do not cross your legs. This may decrease the blood flow to your feet.  If you find a minor scrape, cut, or break in the skin on your feet, keep it and the skin around it clean and dry. These areas may be cleansed with mild soap and water. Do not cleanse the area with peroxide, alcohol, or iodine.  When you remove an adhesive bandage, be sure not to damage the skin around it.  If you have a wound, look at it several times a day to make sure it is healing.  Do not use heating pads or hot water bottles. They may burn your skin. If you have lost feeling in your feet or legs, you may not know it is happening until it is too late.  Make sure your health care provider performs a complete foot exam at least annually or more often if you have foot problems. Report any cuts, sores, or bruises to your health care provider immediately. Contact a health care provider if:  You have an injury that is not healing.  You have cuts or breaks in the skin.  You have an ingrown nail.  You notice redness on your legs or feet.  You feel burning or tingling in your legs or feet.  You have pain or cramps in your legs and feet.  Your legs or feet are numb.  Your feet always feel cold. Get help right away if:  There is increasing redness, swelling, or pain in or around a  wound.  There is a red line that goes up your leg.  Pus is coming from a wound.  You develop a fever or as directed by your health care provider.  You notice a bad smell coming from an ulcer or wound. This information is not intended to replace advice given to you by your health care provider. Make sure you discuss any questions you have with your health care provider. Document Released: 11/09/2000 Document  Revised: 04/19/2016 Document Reviewed: 04/21/2013 Elsevier Interactive Patient Education  2017 [place] Prevention in the Home Introduction Falls can cause injuries. They can happen to people of all ages. There are many things you can do to make your home safe and to help prevent falls. What can I do on the outside of my home?  Regularly fix the edges of walkways and driveways and fix any cracks.  Remove anything that might make you trip as you walk through a door, such as a raised step or threshold.  Trim any bushes or trees on the path to your home.  Use bright outdoor lighting.  Clear any walking paths of anything that might make someone trip, such as rocks or tools.  Regularly check to see if handrails are loose or broken. Make sure that both sides of any steps have handrails.  Any raised decks and porches should have guardrails on the edges.  Have any leaves, snow, or ice cleared regularly.  Use sand or salt on walking paths during winter.  Clean up any spills in your garage right away. This includes oil or grease spills. What can I do in the bathroom?  Use night lights.  Install grab bars by the toilet and in the tub and shower. Do not use towel bars as grab bars.  Use non-skid mats or decals in the tub or shower.  If you need to sit down in the shower, use a plastic, non-slip stool.  Keep the floor dry. Clean up any water that spills on the floor as soon as it happens.  Remove soap buildup in the tub or shower regularly.  Attach bath mats  securely with double-sided non-slip rug tape.  Do not have throw rugs and other things on the floor that can make you trip. What can I do in the bedroom?  Use night lights.  Make sure that you have a light by your bed that is easy to reach.  Do not use any sheets or blankets that are too big for your bed. They should not hang down onto the floor.  Have a firm chair that has side arms. You can use this for support while you get dressed.  Do not have throw rugs and other things on the floor that can make you trip. What can I do in the kitchen?  Clean up any spills right away.  Avoid walking on wet floors.  Keep items that you use a lot in easy-to-reach places.  If you need to reach something above you, use a strong step stool that has a grab bar.  Keep electrical cords out of the way.  Do not use floor polish or wax that makes floors slippery. If you must use wax, use non-skid floor wax.  Do not have throw rugs and other things on the floor that can make you trip. What can I do with my stairs?  Do not leave any items on the stairs.  Make sure that there are handrails on both sides of the stairs and use them. Fix handrails that are broken or loose. Make sure that handrails are as long as the stairways.  Check any carpeting to make sure that it is firmly attached to the stairs. Fix any carpet that is loose or worn.  Avoid having throw rugs at the top or bottom of the stairs. If you do have throw rugs, attach them to the floor with carpet tape.  Make sure that you have a light  switch at the top of the stairs and the bottom of the stairs. If you do not have them, ask someone to add them for you. What else can I do to help prevent falls?  Wear shoes that:  Do not have high heels.  Have rubber bottoms.  Are comfortable and fit you well.  Are closed at the toe. Do not wear sandals.  If you use a stepladder:  Make sure that it is fully opened. Do not climb a closed  stepladder.  Make sure that both sides of the stepladder are locked into place.  Ask someone to hold it for you, if possible.  Clearly mark and make sure that you can see:  Any grab bars or handrails.  First and last steps.  Where the edge of each step is.  Use tools that help you move around (mobility aids) if they are needed. These include:  Canes.  Walkers.  Scooters.  Crutches.  Turn on the lights when you go into a dark area. Replace any light bulbs as soon as they burn out.  Set up your furniture so you have a clear path. Avoid moving your furniture around.  If any of your floors are uneven, fix them.  If there are any pets around you, be aware of where they are.  Review your medicines with your doctor. Some medicines can make you feel dizzy. This can increase your chance of falling. Ask your doctor what other things that you can do to help prevent falls. This information is not intended to replace advice given to you by your health care provider. Make sure you discuss any questions you have with your health care provider. Document Released: 09/08/2009 Document Revised: 04/19/2016 Document Reviewed: 12/17/2014  2017 Elsevier  Health Maintenance, Female Introduction Adopting a healthy lifestyle and getting preventive care can go a long way to promote health and wellness. Talk with your health care provider about what schedule of regular examinations is right for you. This is a good chance for you to check in with your provider about disease prevention and staying healthy. In between checkups, there are plenty of things you can do on your own. Experts have done a lot of research about which lifestyle changes and preventive measures are most likely to keep you healthy. Ask your health care provider for more information. Weight and diet Eat a healthy diet  Be sure to include plenty of vegetables, fruits, low-fat dairy products, and lean protein.  Do not eat a lot of  foods high in solid fats, added sugars, or salt.  Get regular exercise. This is one of the most important things you can do for your health.  Most adults should exercise for at least 150 minutes each week. The exercise should increase your heart rate and make you sweat (moderate-intensity exercise).  Most adults should also do strengthening exercises at least twice a week. This is in addition to the moderate-intensity exercise. Maintain a healthy weight  Body mass index (BMI) is a measurement that can be used to identify possible weight problems. It estimates body fat based on height and weight. Your health care provider can help determine your BMI and help you achieve or maintain a healthy weight.  For females 65 years of age and older:  A BMI below 18.5 is considered underweight.  A BMI of 18.5 to 24.9 is normal.  A BMI of 25 to 29.9 is considered overweight.  A BMI of 30 and above is considered obese. Watch  levels of cholesterol and blood lipids  You should start having your blood tested for lipids and cholesterol at 70 years of age, then have this test every 5 years.  You may need to have your cholesterol levels checked more often if:  Your lipid or cholesterol levels are high.  You are older than 70 years of age.  You are at high risk for heart disease. Cancer screening Lung Cancer  Lung cancer screening is recommended for adults 88-79 years old who are at high risk for lung cancer because of a history of smoking.  A yearly low-dose CT scan of the lungs is recommended for people who:  Currently smoke.  Have quit within the past 15 years.  Have at least a 30-pack-year history of smoking. A pack year is smoking an average of one pack of cigarettes a day for 1 year.  Yearly screening should continue until it has been 15 years since you quit.  Yearly screening should stop if you develop a health problem that would prevent you from having lung cancer treatment. Breast  Cancer  Practice breast self-awareness. This means understanding how your breasts normally appear and feel.  It also means doing regular breast self-exams. Let your health care provider know about any changes, no matter how small.  If you are in your 20s or 30s, you should have a clinical breast exam (CBE) by a health care provider every 1-3 years as part of a regular health exam.  If you are 33 or older, have a CBE every year. Also consider having a breast X-ray (mammogram) every year.  If you have a family history of breast cancer, talk to your health care provider about genetic screening.  If you are at high risk for breast cancer, talk to your health care provider about having an MRI and a mammogram every year.  Breast cancer gene (BRCA) assessment is recommended for women who have family members with BRCA-related cancers. BRCA-related cancers include:  Breast.  Ovarian.  Tubal.  Peritoneal cancers.  Results of the assessment will determine the need for genetic counseling and BRCA1 and BRCA2 testing. Cervical Cancer  Your health care provider may recommend that you be screened regularly for cancer of the pelvic organs (ovaries, uterus, and vagina). This screening involves a pelvic examination, including checking for microscopic changes to the surface of your cervix (Pap test). You may be encouraged to have this screening done every 3 years, beginning at age 13.  For women ages 74-65, health care providers may recommend pelvic exams and Pap testing every 3 years, or they may recommend the Pap and pelvic exam, combined with testing for human papilloma virus (HPV), every 5 years. Some types of HPV increase your risk of cervical cancer. Testing for HPV may also be done on women of any age with unclear Pap test results.  Other health care providers may not recommend any screening for nonpregnant women who are considered low risk for pelvic cancer and who do not have symptoms. Ask your  health care provider if a screening pelvic exam is right for you.  If you have had past treatment for cervical cancer or a condition that could lead to cancer, you need Pap tests and screening for cancer for at least 20 years after your treatment. If Pap tests have been discontinued, your risk factors (such as having a new sexual partner) need to be reassessed to determine if screening should resume. Some women have medical problems that increase the chance of  getting cervical cancer. In these cases, your health care provider may recommend more frequent screening and Pap tests. Colorectal Cancer  This type of cancer can be detected and often prevented.  Routine colorectal cancer screening usually begins at 70 years of age and continues through 70 years of age.  Your health care provider may recommend screening at an earlier age if you have risk factors for colon cancer.  Your health care provider may also recommend using home test kits to check for hidden blood in the stool.  A small camera at the end of a tube can be used to examine your colon directly (sigmoidoscopy or colonoscopy). This is done to check for the earliest forms of colorectal cancer.  Routine screening usually begins at age 55.  Direct examination of the colon should be repeated every 5-10 years through 70 years of age. However, you may need to be screened more often if early forms of precancerous polyps or small growths are found. Skin Cancer  Check your skin from head to toe regularly.  Tell your health care provider about any new moles or changes in moles, especially if there is a change in a mole's shape or color.  Also tell your health care provider if you have a mole that is larger than the size of a pencil eraser.  Always use sunscreen. Apply sunscreen liberally and repeatedly throughout the day.  Protect yourself by wearing long sleeves, pants, a wide-brimmed hat, and sunglasses whenever you are outside. Heart  disease, diabetes, and high blood pressure  High blood pressure causes heart disease and increases the risk of stroke. High blood pressure is more likely to develop in:  People who have blood pressure in the high end of the normal range (130-139/85-89 mm Hg).  People who are overweight or obese.  People who are African American.  If you are 60-47 years of age, have your blood pressure checked every 3-5 years. If you are 46 years of age or older, have your blood pressure checked every year. You should have your blood pressure measured twice-once when you are at a hospital or clinic, and once when you are not at a hospital or clinic. Record the average of the two measurements. To check your blood pressure when you are not at a hospital or clinic, you can use:  An automated blood pressure machine at a pharmacy.  A home blood pressure monitor.  If you are between 72 years and 97 years old, ask your health care provider if you should take aspirin to prevent strokes.  Have regular diabetes screenings. This involves taking a blood sample to check your fasting blood sugar level.  If you are at a normal weight and have a low risk for diabetes, have this test once every three years after 70 years of age.  If you are overweight and have a high risk for diabetes, consider being tested at a younger age or more often. Preventing infection Hepatitis B  If you have a higher risk for hepatitis B, you should be screened for this virus. You are considered at high risk for hepatitis B if:  You were born in a country where hepatitis B is common. Ask your health care provider which countries are considered high risk.  Your parents were born in a high-risk country, and you have not been immunized against hepatitis B (hepatitis B vaccine).  You have HIV or AIDS.  You use needles to inject street drugs.  You live with someone who  has hepatitis B.  You have had sex with someone who has hepatitis  B.  You get hemodialysis treatment.  You take certain medicines for conditions, including cancer, organ transplantation, and autoimmune conditions. Hepatitis C  Blood testing is recommended for:  Everyone born from [birth year] through 1964.  Anyone with known risk factors for hepatitis C. Sexually transmitted infections (STIs)  You should be screened for sexually transmitted infections (STIs) including gonorrhea and chlamydia if:  You are sexually active and are younger than 70 years of age.  You are older than 70 years of age and your health care provider tells you that you are at risk for this type of infection.  Your sexual activity has changed since you were last screened and you are at an increased risk for chlamydia or gonorrhea. Ask your health care provider if you are at risk.  If you do not have HIV, but are at risk, it may be recommended that you take a prescription medicine daily to prevent HIV infection. This is called pre-exposure prophylaxis (PrEP). You are considered at risk if:  You are sexually active and do not regularly use condoms or know the HIV status of your partner(s).  You take drugs by injection.  You are sexually active with a partner who has HIV. Talk with your health care provider about whether you are at high risk of being infected with HIV. If you choose to begin PrEP, you should first be tested for HIV. You should then be tested every 3 months for as long as you are taking PrEP. Pregnancy  If you are premenopausal and you may become pregnant, ask your health care provider about preconception counseling.  If you may become pregnant, take 400 to 800 micrograms (mcg) of folic acid every day.  If you want to prevent pregnancy, talk to your health care provider about birth control (contraception). Osteoporosis and menopause  Osteoporosis is a disease in which the bones lose minerals and strength with aging. This can result in serious bone fractures. Your  risk for osteoporosis can be identified using a bone density scan.  If you are 17 years of age or older, or if you are at risk for osteoporosis and fractures, ask your health care provider if you should be screened.  Ask your health care provider whether you should take a calcium or vitamin D supplement to lower your risk for osteoporosis.  Menopause may have certain physical symptoms and risks.  Hormone replacement therapy may reduce some of these symptoms and risks. Talk to your health care provider about whether hormone replacement therapy is right for you. Follow these instructions at home:  Schedule regular health, dental, and eye exams.  Stay current with your immunizations.  Do not use any tobacco products including cigarettes, chewing tobacco, or electronic cigarettes.  If you are pregnant, do not drink alcohol.  If you are breastfeeding, limit how much and how often you drink alcohol.  Limit alcohol intake to no more than 1 drink per day for nonpregnant women. One drink equals 12 ounces of beer, 5 ounces of wine, or 1 ounces of hard liquor.  Do not use street drugs.  Do not share needles.  Ask your health care provider for help if you need support or information about quitting drugs.  Tell your health care provider if you often feel depressed.  Tell your health care provider if you have ever been abused or do not feel safe at home. This information is not intended to  replace advice given to you by your health care provider. Make sure you discuss any questions you have with your health care provider. Document Released: 05/28/2011 Document Revised: 04/19/2016 Document Reviewed: 08/16/2015  2017 [employer]

## 2016-11-28 NOTE — Progress Notes (Addendum)
Subjective:   [name] is a 71 y.o. female who presents for an Initial Medicare Annual Wellness Visit.  The Patient was informed that the wellness visit is to identify future health risk and educate and initiate measures that can reduce risk for increased disease through the lifespan.    NO ROS; Medicare Wellness Visit Psychosocial; lives with dog toby One level home;  Drives; no accidents mother is living close 39yo No children  No brothers and sisters No clubs or groups  Neighbors are good  Has a nephew for step sister    Describes health as good, fair or great? Ok states "i need to lose weight"   Preventive Screening -Counseling & Management  A1c elevated; new meds and to schedule fup with [facility] when leaving Colonoscopy referral due; declined but did agree to cologuard  Mammogram 09/2016    Smoking history/ former smoker; .50; states she still smokes approx .5 packs a day;  Educated regarding LDCT if applicable with a 30 year hx 23.5 pack year hx  Benefits of quitting; would be more more money left  Cons: It is a habit and hard to give up   ETOH yes but rarely   RISK FACTORS Regular exercise   Wants to start back; states she did go to the gym and rode her bike while watching "soap operas" and agreed that she could do this again.   Diet Control her appetite Breakfast, eggs, grits toast Lunch does not eat lunch Eat 5pm cook; left overs or lies Vegetables   Fall risk x 1 and had a heart monitor to r/o heart issues as she did not know why she fell   Mobility of Functional changes this year? no Safety community no issues  wears sunscreen does not go out in the sun Loves to go fishing at [employer] at [employer]  ,safe place for firearms   Motor vehicle accidents; no   Cardiac Risk Factors:  HTN medically managed  Advanced aged  >65 in women Hyperlipidemia- cho 340; HDL 53; LDL 179; Trig 297' Diabetes yes; uncontrolled; 255 fasting and 10.8 a1c  Family  History (HD)  Obesity 39   Eye exam needed; eye doctor retired; agreed to referral   Hearing Screening   '125Hz'$  '250Hz'$  '500Hz'$  '1000Hz'$  '2000Hz'$  '3000Hz'$  '4000Hz'$  '6000Hz'$  '8000Hz'$   Right ear:       100    Left ear:       100    Vision Screening Comments: About 2  Years ago Needs a new eye doctor Request a referral   Depression Screen PhQ 2: negative  Activities of Daily Living - See functional screen   Cognitive testing; Ad8 score; 0 or less than 2  MMSE deferred or completed if AD8 + 2 issues  Advanced Directives reviewed who would make decision for her since she is by herself; mother  States she does have a nephew which is actually her half-sister's grand son;  His name is Faythe Dingwall and she would consider him, as her mother may not be available or able to assist Given resources at [facility] or [facility] to fup for questions  Patient Care Team: Laurey Morale, MD as PCP - General   Immunization History  Administered Date(s) Administered  . Influenza, High Dose Seasonal PF 10/17/2015, 11/28/2016  . Influenza, Seasonal, Injecte, Preservative Fre 12/01/2012  . Influenza,inj,Quad PF,36+ Mos 09/07/2013, 10/11/2014  . Td 12/22/2007   Required Immunizations needed today  Screening test up to date or reviewed  for plan of completion Health Maintenance Due  Topic Date Due  . Hepatitis C Screening  05/05/1946  . FOOT EXAM  12/14/1956  . OPHTHALMOLOGY EXAM  12/14/1956  . COLONOSCOPY  12/14/1996  . ZOSTAVAX  12/14/2006  . DEXA SCAN  12/15/2011  . PNA vac Low Risk Adult (1 of 2 - PCV13) 12/15/2011  . INFLUENZA VACCINE  06/26/2016   Medicare now request all "baby boomers" test for possible exposure to Hepatitis C. Many may have been exposed due to dental work, tatoo's, vaccinations when young. The Hepatitis C virus is dormant for many years and then sometimes will cause liver cancer. If you gave blood in the past 15 years, you were most likely checked for Hep C. If you rec'd blood; you may  want to consider testing or if you are high risk for any other reason.  Agreed to have hep c drawn at next blood draw  Declined foot exam; stated her feet were fine. She cuts her own toenails. Education given  Needs referral for eye exam  Agreed to cologuard for colonoscopy  Had the shingles; alerted to new vaccine coming out this year.   Agreed to have dexa; will order at the breast center per her preference  Agreed to high dose flu vaccine today  Declined pneumonia vaccine today but agreed to come back in 4 weeks         Objective:    Today's Vitals   11/28/16 1015  BP: 128/78  Pulse: 63  SpO2: 97%  Weight: 236 lb 7 oz (107.2 kg)  Height: '5\' 5"'$  (1.651 m)   Body mass index is 39.35 kg/m.   Current Medications (verified) Outpatient Encounter Prescriptions as of 11/28/2016  Medication Sig  . amLODipine (NORVASC) 10 MG tablet TAKE ONE TABLET BY MOUTH ONCE DAILY  . diclofenac (VOLTAREN) 75 MG EC tablet TAKE ONE TABLET BY MOUTH TWICE DAILY  . glipiZIDE (GLUCOTROL) 10 MG tablet Take 1 tablet (10 mg total) by mouth 2 (two) times daily before a meal.  . lisinopril (PRINIVIL,ZESTRIL) 10 MG tablet TAKE TWO TABLETS BY MOUTH ONCE DAILY  . metFORMIN (GLUCOPHAGE) 1000 MG tablet Take 0.5 tablets (500 mg total) by mouth 2 (two) times daily with a meal.  . metoprolol succinate (TOPROL-XL) 50 MG 24 hr tablet Take 1 tablet (50 mg total) by mouth daily. Take with or immediately following a meal.  . neomycin-polymyxin-hydrocortisone (CORTISPORIN) otic solution Place 4 drops into both ears 4 (four) times daily.  . rosuvastatin (CRESTOR) 40 MG tablet Take 1 tablet (40 mg total) by mouth daily.  Marland Kitchen spironolactone (ALDACTONE) 25 MG tablet TAKE ONE TABLET BY MOUTH ONCE DAILY  . azithromycin (ZITHROMAX Z-PAK) 250 MG tablet Take as directed (Patient not taking: Reported on 11/28/2016)   No facility-administered encounter medications on file as of 11/28/2016.     Allergies (verified) Hydromet  [hydrocodone-homatropine]   History: Past Medical History:  Diagnosis Date  . Acute bronchitis 07/22/2008  . Diabetes mellitus without complication ([place]) 1/61/0960  . 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 Hypertension    Past Surgical History:  Procedure Laterality Date  . 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 NECK SURGERY     Remove a tumor  . TYMPANOSTOMY     Dr. Thornell Mule   Family History  Problem Relation Age of Onset  . Heart disease Father     No details   Social History   Occupational History  . Not on file.  Social History Main Topics  . Smoking status: Former Smoker    Packs/day: 0.50    Years: 47.00    Types: Cigarettes    Start date: 11/26/1968  . Smokeless tobacco: Never Used     Comment: trying to quit;   . Alcohol use 0.0 oz/week     Comment: rare  . Drug use: No  . Sexual activity: Not on file    Tobacco Counseling Counseling given: Yes   Activities of Daily Living In your present state of health, do you have any difficulty performing the following activities: 11/28/2016  Hearing? (No Data)  Some recent data might be hidden    Immunizations and Health Maintenance Immunization History  Administered Date(s) Administered  . Influenza, High Dose Seasonal PF 10/17/2015, 11/28/2016  . Influenza, Seasonal, Injecte, Preservative Fre 12/01/2012  . Influenza,inj,Quad PF,36+ Mos 09/07/2013, 10/11/2014  . Td 12/22/2007   Health Maintenance Due  Topic Date Due  . Hepatitis C Screening  May 15, 1946  . FOOT EXAM  12/14/1956  . OPHTHALMOLOGY EXAM  12/14/1956  . COLONOSCOPY  12/14/1996  . ZOSTAVAX  12/14/2006  . DEXA SCAN  12/15/2011  . PNA vac Low Risk Adult (1 of 2 - PCV13) 12/15/2011  . INFLUENZA VACCINE  06/26/2016    Patient Care Team: Laurey Morale, MD as PCP - General  Indicate any recent Medical Services you may have received from other than [facility] providers in the past year (date may be approximate).     Assessment:   This is a  routine wellness examination for [place].   Hearing/Vision screen  Hearing Screening   '125Hz'$  '250Hz'$  '500Hz'$  '1000Hz'$  '2000Hz'$  '3000Hz'$  '4000Hz'$  '6000Hz'$  '8000Hz'$   Right ear:       100    Left ear:       100    Vision Screening Comments: About 2  Years ago Needs a new eye doctor Request a referral  Dietary issues and exercise activities discussed as stated above    Goals    . Exercise 150 minutes per week (moderate activity)          May consider going back to the y; riding the bike     . Weight (lb) < 200 lb (90.7 kg)          Cut back on portions and get eating under control          Depression Screen PHQ 2/9 Scores 11/28/2016 07/23/2016 10/11/2014  PHQ - 2 Score 0 0 0    Fall Risk Fall Risk  11/28/2016 07/23/2016 10/11/2014  Falls in the past year? Yes No No  Injury with Fall? No - -    Cognitive Function: no issues identified         Screening Tests Health Maintenance  Topic Date Due  . Hepatitis C Screening  1946-02-19  . FOOT EXAM  12/14/1956  . OPHTHALMOLOGY EXAM  12/14/1956  . COLONOSCOPY  12/14/1996  . ZOSTAVAX  12/14/2006  . DEXA SCAN  12/15/2011  . PNA vac Low Risk Adult (1 of 2 - PCV13) 12/15/2011  . INFLUENZA VACCINE  06/26/2016  . HEMOGLOBIN A1C  01/23/2017  . TETANUS/TDAP  12/21/2017  . MAMMOGRAM  10/01/2018      Plan:   PCP Notes  Health Maintenance Hep c ordered for next blood draw Took high does flu vaccine today Agreed to come back for prevnar in 4 weeks Declined foot exam  Declined shingles; advised to 2018 shingles  Agreed to dexa scan and will order  at the breast center    Abnormal Screens   Referrals  Declined the [facility] center Ordered eye referral for screening diabetic retinopathy Given information on [facility] cessation classes  Agreed to cologuard; fax to MD to sign;    Patient concerns; understands she needs to lose weight and start exercising;   Nurse Concerns; BS still high; somewhat motivated to try  and control her diet;   Next PCP apt to schedule   During the course of the visit, Gianni was educated and counseled about the following appropriate screening and preventive services:   Vaccines to include Pneumoccal, Influenza, Hepatitis B, Td, Zostavax, HCV  Electrocardiogram  Cardiovascular disease screening  Colorectal cancer screening agreed to cologuard  Bone density screening - agreed to complete  Diabetes screening ongoing   Glaucoma screening; referral   Mammography/ completed   Nutrition counseling/ discussed; declines diabetes and nutrition center at present   Smoking cessation counseling declines for now but may consider smoking cessation classes   Patient Instructions (the written plan) were given to the patient.    QIONG,EXBMW, RN   11/28/2016   I have reviewed this note and agree with its contents.  Alysia Penna, MD

## 2016-12-26 ENCOUNTER — Encounter: Payer: Self-pay | Admitting: Family Medicine

## 2016-12-26 DIAGNOSIS — H0289 Other specified disorders of eyelid: Secondary | ICD-10-CM | POA: Diagnosis not present

## 2016-12-26 DIAGNOSIS — E119 Type 2 diabetes mellitus without complications: Secondary | ICD-10-CM | POA: Diagnosis not present

## 2016-12-26 DIAGNOSIS — H04123 Dry eye syndrome of bilateral lacrimal glands: Secondary | ICD-10-CM | POA: Diagnosis not present

## 2016-12-26 DIAGNOSIS — H25813 Combined forms of age-related cataract, bilateral: Secondary | ICD-10-CM | POA: Diagnosis not present

## 2016-12-26 DIAGNOSIS — H10413 Chronic giant papillary conjunctivitis, bilateral: Secondary | ICD-10-CM | POA: Diagnosis not present

## 2016-12-26 LAB — HM DIABETES EYE EXAM

## 2017-01-23 ENCOUNTER — Telehealth: Payer: Self-pay

## 2017-01-23 NOTE — Telephone Encounter (Signed)
This patient came in yesterday with her mother on 2/27 and stated she had not rec'd the cologuard. Call placed to [employer] and they did not receive the fax. refaxed today Therapist, art

## 2017-01-29 ENCOUNTER — Ambulatory Visit (INDEPENDENT_AMBULATORY_CARE_PROVIDER_SITE_OTHER): Payer: Medicare Other | Admitting: Family Medicine

## 2017-01-29 ENCOUNTER — Encounter: Payer: Self-pay | Admitting: Family Medicine

## 2017-01-29 VITALS — BP 148/86 | HR 81 | Temp 98.5°F | Ht 65.0 in | Wt 228.0 lb

## 2017-01-29 DIAGNOSIS — E119 Type 2 diabetes mellitus without complications: Secondary | ICD-10-CM | POA: Diagnosis not present

## 2017-01-29 DIAGNOSIS — E782 Mixed hyperlipidemia: Secondary | ICD-10-CM

## 2017-01-29 DIAGNOSIS — I10 Essential (primary) hypertension: Secondary | ICD-10-CM | POA: Diagnosis not present

## 2017-01-29 LAB — BASIC METABOLIC PANEL
BUN: 24 mg/dL — ABNORMAL HIGH (ref 6–23)
CO2: 24 meq/L (ref 19–32)
Calcium: 10.2 mg/dL (ref 8.4–10.5)
Chloride: 109 mEq/L (ref 96–112)
Creatinine, Ser: 0.97 mg/dL (ref 0.40–1.20)
GFR: 72.99 mL/min (ref >60.00)
GLUCOSE: 137 mg/dL — AB (ref 70–99)
POTASSIUM: 4.7 meq/L (ref 3.5–5.1)
SODIUM: 140 meq/L (ref 135–145)

## 2017-01-29 LAB — HEMOGLOBIN A1C: Hgb A1c MFr Bld: 9.1 % — ABNORMAL HIGH (ref 4.6–6.5)

## 2017-01-29 NOTE — Patient Instructions (Signed)
WE NOW OFFER   [facility] FAST TRACK!!!  SAME DAY Appointments for ACUTE CARE  Such as: Sprains, Injuries, cuts, abrasions, rashes, muscle pain, joint pain, back pain Colds, flu, sore throats, headache, allergies, cough, fever  Ear pain, sinus and eye infections Abdominal pain, nausea, vomiting, diarrhea, upset stomach Animal/insect bites  3 Easy Ways to Schedule: Walk-In Scheduling Call in scheduling Mychart Sign-up: [email or web address]         

## 2017-01-29 NOTE — Progress Notes (Signed)
Pre visit review using our clinic review tool, if applicable. No additional management support is needed unless otherwise documented below in the visit note. 

## 2017-01-29 NOTE — Progress Notes (Signed)
   Subjective:    Patient ID: [name], female    DOB: 09/19/1946, 71 y.o.   MRN: [id number]  HPI Here to follow up on lipids, BP, and DM. Her BP at home averages in the 140s over 80s.  She does not check her glucoses.    Review of Systems  Constitutional: Negative.   Respiratory: Negative.   Cardiovascular: Negative.   Neurological: Negative.        Objective:   Physical Exam  Constitutional: She is oriented to person, place, and time.  Morbidly obese   Cardiovascular: Normal rate, regular rhythm, normal heart sounds and intact distal pulses.   Pulmonary/Chest: Effort normal and breath sounds normal.  Musculoskeletal: She exhibits no edema.  Neurological: She is alert and oriented to person, place, and time.          Assessment & Plan:  Her HTN is stable. She is not fasting today so we will need to check her lipids at another time. For the DM we will check an A1c today.  I write for her to get her own glucometer to follow her glucoses at home. We will also send her for a Nutrition consult.  Alysia Penna, MD

## 2017-02-01 ENCOUNTER — Other Ambulatory Visit: Payer: Self-pay | Admitting: Family Medicine

## 2017-02-01 MED ORDER — PIOGLITAZONE HCL 30 MG PO TABS: 30.0000 mg | ORAL_TABLET | Freq: Every day | ORAL | 3 refills | Status: DC

## 2017-02-06 ENCOUNTER — Other Ambulatory Visit: Payer: Self-pay | Admitting: Family Medicine

## 2017-02-14 ENCOUNTER — Encounter: Payer: Self-pay | Admitting: Dietician

## 2017-02-14 ENCOUNTER — Encounter: Payer: Medicare Other | Attending: Family Medicine | Admitting: Dietician

## 2017-02-14 DIAGNOSIS — Z713 Dietary counseling and surveillance: Secondary | ICD-10-CM | POA: Insufficient documentation

## 2017-02-14 DIAGNOSIS — E119 Type 2 diabetes mellitus without complications: Secondary | ICD-10-CM

## 2017-02-14 NOTE — Patient Instructions (Addendum)
Call your insurance company to see if the meter I gave you is approved by your insurance.   If the meter is approved then call your doctor and ask for a prescription be sent to your pharmacy for lancet needles and strips.  If they don't approve the meter I gave you then find out what they do approve and call the doctor and ask for a prescription to be sent to your pharmacy for the lancet needles and strips for the approved meter.  Be sure to have Breakfast, lunch, and dinner. Consider decreasing added sugar. Increase your non starchy vegetable intake. Start to exercise most days.  Start slow and increase as tolerated.  Plan:  Aim for 2-3 Carb Choices per meal (30-45 grams) +/- 1 either way  Aim for 0-1 Carbs per snack if hungry  Include protein in moderation with your meals and snacks Consider reading food labels for Total Carbohydrate and Fat Grams of foods Consider  increasing your activity level by walking for 30 minutes daily as tolerated Consider checking BG at alternate times per day as directed by MD  Continue taking medication as directed by MD

## 2017-02-15 NOTE — Progress Notes (Signed)
Diabetes Self-Management Education  Visit Type: First/Initial  Appt. Start Time: 1400  Appt. End Time: 1540  02/15/2017  [name], identified by name and date of birth, is a 71 y.o. female with a diagnosis of Diabetes: Type 2. Other hx includes hyperlipidemia and HTN.  She follows a low sodium diet and is lactose intolerant.   Labs:  Cholesterol 340, Triglycerides 297, HDL 53, LDL 321 (07/23/16). A1C has decreased from 10.8% 07/23/16 to 9.1% 01/29/17. Medications include Metformin, glipizide, pioglitazone. She was given a prescription from MD for a meter so she could begin testing her blood sugar.  She lost the prescription and is afraid to prick her finger for testing.   Meter provided:  One Touch Verio Flex Lot [id number] x  Expiration Date 04/25/18.  She demonstrated it's use with education.  Blood sugar was 234.  Patient lives alone.  She is retired and used to work at [employer] at [employer].  She smokes and is resistant to quitting at this time.  ASSESSMENT  Height '5\' 5"'$  (1.651 m), weight 232 lb (105.2 kg). Body mass index is 38.61 kg/m.  UBW 200 lbs.      Diabetes Self-Management Education - 02/14/17 1409      Visit Information   Visit Type First/Initial     Initial Visit   Diabetes Type Type 2   Are you currently following a meal plan? No   Are you taking your medications as prescribed? Yes   Date Diagnosed 2017     Health Coping   How would you rate your overall health? Good     Psychosocial Assessment   Patient Belief/Attitude about Diabetes Afraid  Because I don't understand it.   Self-care barriers None   Self-management support Doctor's office   Other persons present Patient   Patient Concerns Other (comment);Nutrition/Meal planning;Glycemic Control;Weight Control  learn about diabetes   Special Needs None   Preferred Learning Style No preference indicated   Learning Readiness Ready   How often do you need to have someone help you when you read  instructions, pamphlets, or other written materials from your doctor or pharmacy? 1 - Never   What is the last grade level you completed in school? 12th grade     Pre-Education Assessment   Patient understands the diabetes disease and treatment process. Needs Instruction   Patient understands incorporating nutritional management into lifestyle. Needs Instruction   Patient undertands incorporating physical activity into lifestyle. Needs Instruction   Patient understands using medications safely. Needs Instruction   Patient understands monitoring blood glucose, interpreting and using results Needs Instruction   Patient understands prevention, detection, and treatment of acute complications. Needs Instruction   Patient understands prevention, detection, and treatment of chronic complications. Needs Instruction   Patient understands how to develop strategies to address psychosocial issues. Needs Instruction   Patient understands how to develop strategies to promote health/change behavior. Needs Instruction     Complications   Last HgB A1C per patient/outside source 9.1 %  01/29/17 decreased from 108% 07/23/16   How often do you check your blood sugar? 0 times/day (not testing)   Have you had a dilated eye exam in the past 12 months? Yes   Have you had a dental exam in the past 12 months? No  dentures   Are you checking your feet? Yes   How many days per week are you checking your feet? 7     Dietary Intake   Breakfast Bran muffin, banana,  coffee with 2 tsp splenda sugar and sugar free vanilla creamer or canned milk OR quick oatmeal, canned milk, butter, sugar  6   Snack (morning) fruit   Lunch skips OR banana  12:30   Snack (afternoon) occasional fruit   Dinner bread, rice and gravy (but less since diabetes), baked chicken or fried fish, peas or sweet potatoes or non starchy vegetables  5   Snack (evening) none   Beverage(s) water, crystal lite, coffee with sugar or splenda and canned milk  or sugar free creamer, rare coke zero, unsweetened tea     Exercise   Exercise Type ADL's   How many days per week to you exercise? 0   How many minutes per day do you exercise? 0   Total minutes per week of exercise 0     Patient Education   Previous Diabetes Education No   Disease state  Definition of diabetes, type 1 and 2, and the diagnosis of diabetes;Factors that contribute to the development of diabetes;Explored patient's options for treatment of their diabetes   Nutrition management  Role of diet in the treatment of diabetes and the relationship between the three main macronutrients and blood glucose level;Food label reading, portion sizes and measuring food.;Meal options for control of blood glucose level and chronic complications.   Physical activity and exercise  Role of exercise on diabetes management, blood pressure control and cardiac health.;Helped patient identify appropriate exercises in relation to his/her diabetes, diabetes complications and other health issue.   Medications Reviewed patients medication for diabetes, action, purpose, timing of dose and side effects.   Monitoring Taught/evaluated SMBG meter.;Purpose and frequency of SMBG.;Identified appropriate SMBG and/or A1C goals.;Taught/discussed recording of test results and interpretation of SMBG.;Daily foot exams;Yearly dilated eye exam   Acute complications Taught treatment of hypoglycemia - the 15 rule.   Chronic complications Relationship between chronic complications and blood glucose control;Assessed and discussed foot care and prevention of foot problems;Dental care;Retinopathy and reason for yearly dilated eye exams   Psychosocial adjustment Worked with patient to identify barriers to care and solutions;Role of stress on diabetes;Identified and addressed patients feelings and concerns about diabetes   Personal strategies to promote health Lifestyle issues that need to be addressed for better diabetes care;Review risk  of smoking and offered smoking cessation     Individualized Goals (developed by patient)   Nutrition General guidelines for healthy choices and portions discussed   Physical Activity Exercise 5-7 days per week;30 minutes per day   Monitoring  test my blood glucose as discussed   Problem Solving meal schedule   Reducing Risk stop smoking;treat hypoglycemia with 15 grams of carbs if blood glucose less than '70mg'$ /dL;do foot checks daily   Health Coping discuss diabetes with (comment)  RD/MD     Post-Education Assessment   Patient understands the diabetes disease and treatment process. Demonstrates understanding / competency   Patient understands incorporating nutritional management into lifestyle. Demonstrates understanding / competency   Patient undertands incorporating physical activity into lifestyle. Demonstrates understanding / competency   Patient understands using medications safely. Demonstrates understanding / competency   Patient understands monitoring blood glucose, interpreting and using results Demonstrates understanding / competency   Patient understands prevention, detection, and treatment of acute complications. Demonstrates understanding / competency   Patient understands prevention, detection, and treatment of chronic complications. Demonstrates understanding / competency   Patient understands how to develop strategies to address psychosocial issues. Demonstrates understanding / competency   Patient understands how to develop strategies to  promote health/change behavior. Demonstrates understanding / competency     Outcomes   Expected Outcomes Demonstrated interest in learning. Expect positive outcomes   Future DMSE PRN   Program Status Completed      Individualized Plan for Diabetes Self-Management Training:   Learning Objective:  Patient will have a greater understanding of diabetes self-management. Patient education plan is to attend individual and/or group sessions  per assessed needs and concerns.   Plan:   Patient Instructions  Call your insurance company to see if the meter I gave you is approved by your insurance.   If the meter is approved then call your doctor and ask for a prescription be sent to your pharmacy for lancet needles and strips.  If they don't approve the meter I gave you then find out what they do approve and call the doctor and ask for a prescription to be sent to your pharmacy for the lancet needles and strips for the approved meter.  Be sure to have Breakfast, lunch, and dinner. Consider decreasing added sugar. Increase your non starchy vegetable intake. Start to exercise most days.  Start slow and increase as tolerated.  Plan:  Aim for 2-3 Carb Choices per meal (30-45 grams) +/- 1 either way  Aim for 0-1 Carbs per snack if hungry  Include protein in moderation with your meals and snacks Consider reading food labels for Total Carbohydrate and Fat Grams of foods Consider  increasing your activity level by walking for 30 minutes daily as tolerated Consider checking BG at alternate times per day as directed by MD  Continue taking medication as directed by MD        Expected Outcomes:  Demonstrated interest in learning. Expect positive outcomes  Education material provided: Living Well with Diabetes, Food label handouts, A1C conversion sheet, Meal plan card, My Plate and Snack sheet  If problems or questions, patient to contact team via:  Phone  Future DSME appointment: PRN

## 2017-02-19 ENCOUNTER — Other Ambulatory Visit: Payer: Self-pay | Admitting: Family Medicine

## 2017-02-19 ENCOUNTER — Ambulatory Visit: Payer: Medicare Other

## 2017-02-19 NOTE — Telephone Encounter (Signed)
Refill request for One Touch Verio Flex strips, lancets, alcohol pads and send to [facility] on [address].

## 2017-02-20 MED ORDER — ONETOUCH ULTRASOFT LANCETS MISC: 1 refills | Status: DC

## 2017-02-20 MED ORDER — GLUCOSE BLOOD VI STRP: ORAL_STRIP | 1 refills | Status: DC

## 2017-02-20 MED ORDER — ALCOHOL PREP PADS: MEDICATED_PAD | 1 refills | Status: AC

## 2017-02-20 NOTE — Telephone Encounter (Signed)
I sent all 3 scripts e-scribe to [facility].

## 2017-02-27 ENCOUNTER — Other Ambulatory Visit: Payer: Self-pay | Admitting: Family Medicine

## 2017-03-07 ENCOUNTER — Other Ambulatory Visit: Payer: Self-pay | Admitting: Family Medicine

## 2017-03-11 ENCOUNTER — Other Ambulatory Visit: Payer: Self-pay | Admitting: Family Medicine

## 2017-03-12 NOTE — Telephone Encounter (Signed)
Did not see this medication on current list.

## 2017-06-04 ENCOUNTER — Encounter: Payer: Self-pay | Admitting: Family Medicine

## 2017-06-04 ENCOUNTER — Ambulatory Visit (INDEPENDENT_AMBULATORY_CARE_PROVIDER_SITE_OTHER): Payer: Medicare HMO | Admitting: Family Medicine

## 2017-06-04 VITALS — BP 136/84 | Temp 98.0°F | Ht 65.0 in | Wt 225.0 lb

## 2017-06-04 DIAGNOSIS — H6123 Impacted cerumen, bilateral: Secondary | ICD-10-CM

## 2017-06-04 NOTE — Patient Instructions (Signed)
WE NOW OFFER   [name] FAST TRACK!!!  SAME DAY Appointments for ACUTE CARE  Such as: Sprains, Injuries, cuts, abrasions, rashes, muscle pain, joint pain, back pain Colds, flu, sore throats, headache, allergies, cough, fever  Ear pain, sinus and eye infections Abdominal pain, nausea, vomiting, diarrhea, upset stomach Animal/insect bites  3 Easy Ways to Schedule: Walk-In Scheduling Call in scheduling Mychart Sign-up: [email or web address]         

## 2017-06-04 NOTE — Progress Notes (Signed)
   Subjective:    Patient ID: [name], female    DOB: 02/09/1946, 71 y.o.   MRN: [id number]  HPI Here to check her ears. She has decreased hearing on both sides and has some drainage of foul smelling fluid from both of them. No pain. She had PE tubes placed in both by Dr. Cresenciano Lick a year or so ago.    Review of Systems  Constitutional: Negative.   HENT: Positive for ear discharge and hearing loss. Negative for congestion, ear pain, postnasal drip, sinus pain and sinus pressure.   Eyes: Negative.   Respiratory: Negative.        Objective:   Physical Exam  Constitutional: She appears well-developed and well-nourished.  HENT:  Nose: Nose normal.  Mouth/Throat: Oropharynx is clear and moist.  Right ear canal has a small amount of cerumen present, the TM is clear with no PE tube visible. The left ear canal is blocked by cerumen. Both ears have a foul odor.   Eyes: Conjunctivae are normal.  Neck: No thyromegaly present.  Pulmonary/Chest: Effort normal and breath sounds normal. No respiratory distress. She has no wheezes. She has no rales.  Lymphadenopathy:    She has no cervical adenopathy.          Assessment & Plan:  Cerumen impactions. Both ears were irrigated clear with water.  Alysia Penna, MD

## 2017-06-10 ENCOUNTER — Telehealth: Payer: Self-pay | Admitting: Family Medicine

## 2017-06-10 MED ORDER — NEOMYCIN-COLIST-HC-THONZONIUM 3.3-3-10-0.5 MG/ML OT SUSP: 4.0000 [drp] | Freq: Four times a day (QID) | OTIC | 1 refills | Status: DC

## 2017-06-10 NOTE — Telephone Encounter (Signed)
Patient called back.  I updated her phone number and advised an Rx was ordered.

## 2017-06-10 NOTE — Telephone Encounter (Signed)
Can you see if you have another number? Just wanted to let her know Dr. Sarajane Jews ordered medication.

## 2017-06-10 NOTE — Telephone Encounter (Signed)
° °  Pt call to say that she saw Dr Sarajane Jews last week and was told to call back if she was not better. She said she is still having a lot of drainage and would like a call back

## 2017-06-10 NOTE — Telephone Encounter (Signed)
Call in Cortisporin Otic to place 4 drops in both ears every 6 hours prn, 10 ml with one rf

## 2017-06-10 NOTE — Telephone Encounter (Signed)
I sent script e-scribe to [facility]. Tried to reach pt and no answer.

## 2017-06-15 ENCOUNTER — Other Ambulatory Visit: Payer: Self-pay | Admitting: Family Medicine

## 2017-06-17 NOTE — Telephone Encounter (Signed)
Looks like script hasn't been filled in awhile, just need to confirm medication is current?

## 2017-06-20 ENCOUNTER — Other Ambulatory Visit: Payer: Self-pay | Admitting: Family Medicine

## 2017-06-20 MED ORDER — GLIPIZIDE 10 MG PO TABS: 10.0000 mg | ORAL_TABLET | Freq: Two times a day (BID) | ORAL | 0 refills | Status: DC

## 2017-06-20 NOTE — Telephone Encounter (Signed)
I sent script e-scribe. 

## 2017-06-20 NOTE — Telephone Encounter (Signed)
Refill request for Glipizide 10 mg take 1 po bid and send to [facility].

## 2017-07-05 ENCOUNTER — Other Ambulatory Visit: Payer: Self-pay | Admitting: Family Medicine

## 2017-07-16 ENCOUNTER — Ambulatory Visit (HOSPITAL_COMMUNITY)
Admission: EM | Admit: 2017-07-16 | Discharge: 2017-07-16 | Disposition: A | Discharge status: Home / Self Care | Payer: Medicare Other | Attending: Family Medicine | Admitting: Family Medicine

## 2017-07-16 ENCOUNTER — Encounter (HOSPITAL_COMMUNITY): Payer: Self-pay | Admitting: Emergency Medicine

## 2017-07-16 DIAGNOSIS — Z23 Encounter for immunization: Secondary | ICD-10-CM | POA: Diagnosis not present

## 2017-07-16 DIAGNOSIS — S0181XA Laceration without foreign body of other part of head, initial encounter: Secondary | ICD-10-CM | POA: Diagnosis not present

## 2017-07-16 MED ORDER — TETANUS-DIPHTH-ACELL PERTUSSIS 5-2.5-18.5 LF-MCG/0.5 IM SUSP
0.5000 mL | Freq: Once | INTRAMUSCULAR | Status: AC
Start: 2017-07-16 — End: 2017-07-16
  Administered 2017-07-16: 0.5 mL via INTRAMUSCULAR

## 2017-07-16 MED ORDER — TETANUS-DIPHTH-ACELL PERTUSSIS 5-2.5-18.5 LF-MCG/0.5 IM SUSP
INTRAMUSCULAR | Status: AC
  Filled 2017-07-16: qty 0.5

## 2017-07-16 MED ORDER — HYDROCODONE-ACETAMINOPHEN 5-325 MG PO TABS: 1.0000 | ORAL_TABLET | ORAL | 0 refills | Status: DC | PRN

## 2017-07-16 NOTE — ED Triage Notes (Signed)
PT tripped over her dog and has a laceration above right eye. No LOC with fall.

## 2017-07-16 NOTE — Discharge Instructions (Signed)
Follow up in 7 - 10 days for suture removal

## 2017-07-16 NOTE — ED Provider Notes (Signed)
[facility]    CSN: [id number] Arrival date & time: 07/16/17  1910     History   Chief Complaint Chief Complaint  Patient presents with  . Facial Laceration    HPI DELESHA Ramsey is a 71 y.o. female.   Patient has laceration right eye brow after tripping and hitting her head.  She denies any LOC.  She is unsure about TD status.   The history is provided by the patient.  Laceration  Location:  Face Length:  3 cm Depth:  Through underlying tissue Quality: straight   Bleeding: venous   Time since incident:  1 hour Laceration mechanism:  Blunt object Pain details:    Quality:  Aching   Severity:  Moderate   Timing:  Constant   Progression:  Worsening Foreign body present:  No foreign bodies Relieved by:  Nothing Tetanus status:  Unknown   Past Medical History:  Diagnosis Date  . Acute bronchitis 07/22/2008  . Diabetes mellitus without complication ([place]) 08/16/1940  . 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 Hypertension     Patient Active Problem List   Diagnosis Date Noted  . Syncope 11/03/2015  . ACUTE BRONCHITIS 07/22/2008  . Diabetes mellitus without complication ([place]) 74/06/1447  . 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 Essential hypertension 07/08/2007    Past Surgical History:  Procedure Laterality Date  . 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 NECK SURGERY     Remove a tumor  . TYMPANOSTOMY     Dr. Thornell Mule    OB History    No data available       Home Medications    Prior to Admission medications   Medication Sig Start Date End Date Taking? Authorizing Provider  Alcohol Swabs (ALCOHOL PREP) PADS Use to test 1 X day and diagnosis code is E 11.9 02/20/17   Laurey Morale, MD  amLODipine (NORVASC) 10 MG tablet TAKE ONE TABLET BY MOUTH ONCE DAILY 06/17/17   Laurey Morale, MD  diclofenac (VOLTAREN) 75 MG EC tablet TAKE ONE TABLET BY MOUTH TWICE DAILY 11/23/16   Laurey Morale, MD  glipiZIDE (GLUCOTROL) 10 MG tablet Take 1  tablet (10 mg total) by mouth 2 (two) times daily before a meal. 06/20/17   Laurey Morale, MD  glucose blood (ONETOUCH VERIO) test strip Test 1 x day and diagnosis code is E 11.9 02/20/17   Laurey Morale, MD  HYDROcodone-acetaminophen (NORCO/VICODIN) 5-325 MG tablet Take 1 tablet by mouth every 4 (four) hours as needed. 07/16/17   Lysbeth Penner, FNP  Lancets [facility] ULTRASOFT) lancets Test 1 X day and diagnosis code is E 11.9 02/20/17   Laurey Morale, MD  lisinopril (PRINIVIL,ZESTRIL) 10 MG tablet TAKE TWO TABLETS BY MOUTH ONCE DAILY 09/04/16   Laurey Morale, MD  metFORMIN (GLUCOPHAGE) 1000 MG tablet Take 0.5 tablets (500 mg total) by mouth 2 (two) times daily with a meal. 11/23/16   Laurey Morale, MD  metoprolol succinate (TOPROL-XL) 50 MG 24 hr tablet TAKE ONE TABLET BY MOUTH ONCE DAILY. TAKE WITH OR IMMEDIATELY FOLLOWING A MEAL. 02/06/17   Laurey Morale, MD  neomycin-colistin-hydrocortisone-thonzonium (CORTISPORIN-TC) 3.01-26-09-0.5 MG/ML OTIC suspension Place 4 drops into both ears 4 (four) times daily. 06/10/17   Laurey Morale, MD  neomycin-polymyxin-hydrocortisone (CORTISPORIN) otic solution Place 4 drops into both ears 4 (four) times daily. Patient not taking: Reported on 06/04/2017 07/23/16   Laurey Morale, MD  pioglitazone (ACTOS) 30  MG tablet Take 1 tablet (30 mg total) by mouth daily. 02/01/17   Laurey Morale, MD  rosuvastatin (CRESTOR) 40 MG tablet Take 1 tablet (40 mg total) by mouth daily. 07/26/16   Laurey Morale, MD  spironolactone (ALDACTONE) 25 MG tablet TAKE ONE TABLET BY MOUTH ONCE DAILY 11/23/16   Minus Breeding, MD    Family History Family History  Problem Relation Age of Onset  . Heart disease Father        No details    Social History Social History  Substance Use Topics  . Smoking status: Former Smoker    Packs/day: 0.50    Years: 47.00    Types: Cigarettes    Start date: 11/26/1968  . Smokeless tobacco: Never Used     Comment: trying to quit;   . Alcohol  use 0.0 oz/week     Comment: rare     Allergies   Hydromet [hydrocodone-homatropine]   Review of Systems Review of Systems  Constitutional: Negative.   HENT: Negative.   Eyes: Negative.   Respiratory: Negative.   Cardiovascular: Negative.   Gastrointestinal: Negative.   Endocrine: Negative.   Genitourinary: Negative.   Musculoskeletal: Negative.   Skin: Positive for wound.  Allergic/Immunologic: Negative.   Neurological: Negative.   Psychiatric/Behavioral: Negative.      Physical Exam Triage Vital Signs ED Triage Vitals  Enc Vitals Group     BP 07/16/17 1932 (!) 205/105     Pulse Rate 07/16/17 1932 87     Resp 07/16/17 1932 16     Temp 07/16/17 1932 (!) 97.5 F (36.4 C)     Temp Source 07/16/17 1932 Oral     SpO2 07/16/17 1932 97 %     Weight 07/16/17 1933 225 lb (102.1 kg)     Height --      Head Circumference --      Peak Flow --      Pain Score --      Pain Loc --      Pain Edu? --      Excl. in [place]? --    No data found.   Updated Vital Signs BP (!) 205/105   Pulse 87   Temp (!) 97.5 F (36.4 C) (Oral)   Resp 16   Wt 225 lb (102.1 kg)   SpO2 97%   BMI 37.44 kg/m   Visual Acuity Right Eye Distance:   Left Eye Distance:   Bilateral Distance:    Right Eye Near:   Left Eye Near:    Bilateral Near:     Physical Exam  Constitutional: She appears well-developed and well-nourished.  HENT:  Head: Normocephalic and atraumatic.  Eyes: Pupils are equal, round, and reactive to light. Conjunctivae and EOM are normal.  Neck: Normal range of motion. Neck supple.  Cardiovascular: Normal rate, regular rhythm and normal heart sounds.   Pulmonary/Chest: Effort normal and breath sounds normal.  Skin:  4 cm laceration right eye brow  Nursing note and vitals reviewed.    UC Treatments / Results  Labs (all labs ordered are listed, but only abnormal results are displayed) Labs Reviewed - No data to display  EKG  EKG Interpretation None        Radiology No results found.  Procedures .Marland KitchenLaceration Repair Date/Time: 07/16/2017 8:32 PM Performed by: Lysbeth Penner Authorized by: Vanessa Kick   Consent:    Consent obtained:  Verbal   Consent given by:  Patient   Risks discussed:  Infection  and pain   Alternatives discussed:  No treatment Anesthesia (see MAR for exact dosages):    Anesthesia method:  Local infiltration   Local anesthetic:  Lidocaine 1% WITH epi Laceration details:    Location:  Face   Length (cm):  4   Depth (mm):  4 Repair type:    Repair type:  Simple Pre-procedure details:    Preparation:  Patient was prepped and draped in usual sterile fashion Exploration:    Hemostasis achieved with:  Direct pressure   Wound exploration: wound explored through full range of motion     Contaminated: no   Treatment:    Area cleansed with:  Saline and Betadine   Amount of cleaning:  Standard Skin repair:    Repair method:  Sutures   Suture size:  4-0   Suture material:  Chromic gut   Number of sutures:  2 Approximation:    Approximation:  Close   Vermilion border: well-aligned   Post-procedure details:    Dressing:  Antibiotic ointment and non-adherent dressing Comments:     #2 4.0 chromic gut placed initially to help close and then #7 5.0 nylon sutures placed to approximate laceration edges.   (including critical care time)  Medications Ordered in UC Medications  Tdap (BOOSTRIX) injection 0.5 mL (0.5 mLs Intramuscular Given 07/16/17 1959)     Initial Impression / Assessment and Plan / UC Course  I have reviewed the triage vital signs and the nursing notes.  Pertinent labs & imaging results that were available during my care of the patient were reviewed by me and considered in my medical decision making (see chart for details).       Final Clinical Impressions(s) / UC Diagnoses   Final diagnoses:  Facial laceration, initial encounter    New Prescriptions Discharge Medication List as  of 07/16/2017  8:01 PM    START taking these medications   Details  HYDROcodone-acetaminophen (NORCO/VICODIN) 5-325 MG tablet Take 1 tablet by mouth every 4 (four) hours as needed., Starting Tue 07/16/2017, Print         Controlled Substance Prescriptions [place] Controlled Substance Registry consulted? Yes, I have consulted the [place] Controlled Substances Registry for this patient, and feel the risk/benefit ratio today is favorable for proceeding with this prescription for a controlled substance.   Lysbeth Penner, Wink 07/16/17 2035

## 2017-07-22 ENCOUNTER — Telehealth: Payer: Self-pay | Admitting: Family Medicine

## 2017-07-22 MED ORDER — METFORMIN HCL 1000 MG PO TABS: 500.0000 mg | ORAL_TABLET | Freq: Two times a day (BID) | ORAL | 1 refills | Status: DC

## 2017-07-22 MED ORDER — GLIPIZIDE 10 MG PO TABS
10.0000 mg | ORAL_TABLET | Freq: Two times a day (BID) | ORAL | 1 refills | Status: DC
Start: 2017-07-22 — End: 2017-12-24

## 2017-07-22 MED ORDER — GLIPIZIDE 10 MG PO TABS: 5.0000 mg | ORAL_TABLET | Freq: Two times a day (BID) | ORAL | 1 refills | Status: DC

## 2017-07-22 NOTE — Telephone Encounter (Signed)
Refill request for Metformin 1000 mg take 1 po bid, and Glipizide 10 mg take 1 po bid, also a 90 day supply to [facility]. Can you clarify directions on Metformin, previous script said 1/2 tablet bid?

## 2017-07-22 NOTE — Telephone Encounter (Signed)
This is correct, she takes whole tab Glipizide bid and 1/2 tab of Metformin bid

## 2017-07-22 NOTE — Telephone Encounter (Signed)
I sent script e-scribe to [facility].

## 2017-07-22 NOTE — Telephone Encounter (Signed)
I left a voice message for pt with below information.

## 2017-07-30 ENCOUNTER — Ambulatory Visit (INDEPENDENT_AMBULATORY_CARE_PROVIDER_SITE_OTHER): Payer: Medicare Other | Admitting: Family Medicine

## 2017-07-30 ENCOUNTER — Encounter: Payer: Self-pay | Admitting: Family Medicine

## 2017-07-30 VITALS — BP 130/76 | Temp 98.5°F | Ht 65.0 in | Wt 218.0 lb

## 2017-07-30 DIAGNOSIS — S0181XD Laceration without foreign body of other part of head, subsequent encounter: Secondary | ICD-10-CM | POA: Diagnosis not present

## 2017-07-30 DIAGNOSIS — H6061 Unspecified chronic otitis externa, right ear: Secondary | ICD-10-CM

## 2017-07-30 NOTE — Progress Notes (Addendum)
   Subjective:    Patient ID: [name], female    DOB: [birth year], 71 y.o.   MRN: [id number]  HPI Here for 2 issues. First is a follow up from an ER visit on 07-16-17 for a fall after she tripped over her dog. She sustained a laceration over the right eyebrow. This was closed with sutures. There was no LOC. The area is sore but is healing well. Second she ha shad constant drainage of clear foul-smelling fluid form the right ear for the past 3 weeks. No pain or sinus symptoms. Of note, she also complains of chronic low back pain and of burning and tingling in both feet. Her glucoses have been stable.    Review of Systems  Constitutional: Negative.   HENT: Positive for ear discharge. Negative for congestion, ear pain, hearing loss, postnasal drip, sinus pain and sinus pressure.   Eyes: Negative.   Respiratory: Negative.   Cardiovascular: Negative.   Skin: Positive for wound.  Neurological: Negative.        Objective:   Physical Exam  Constitutional: She is oriented to person, place, and time. She appears well-developed and well-nourished. No distress.  HENT:  Left Ear: External ear normal.  Nose: Nose normal.  Mouth/Throat: Oropharynx is clear and moist.  The right eyebrow has a laceration which is closed and looks clean. The right ear canal is damp with a clear exudate but no erythema. The TM is clear.  Eyes: Pupils are equal, round, and reactive to light. Conjunctivae and EOM are normal.  Neck: Neck supple. No thyromegaly present.  Cardiovascular: Normal rate, regular rhythm, normal heart sounds and intact distal pulses.   Pulmonary/Chest: Effort normal and breath sounds normal. No respiratory distress. She has no wheezes. She has no rales.  Lymphadenopathy:    She has no cervical adenopathy.  Neurological: She is alert and oriented to person, place, and time.          Assessment & Plan:  The laceration is healing as expected. All 7 sutures were removed today. The right  ear canal has a fungal infection so we will refer her to ENT. For the back pain, we will arrange for her to wear a back brace. She has diabetic neuropathy, and we will arrnage for her to get diabetic shoes. Alysia Penna, MD

## 2017-07-30 NOTE — Patient Instructions (Signed)
WE NOW OFFER   [facility] FAST TRACK!!!  SAME DAY Appointments for ACUTE CARE  Such as: Sprains, Injuries, cuts, abrasions, rashes, muscle pain, joint pain, back pain Colds, flu, sore throats, headache, allergies, cough, fever  Ear pain, sinus and eye infections Abdominal pain, nausea, vomiting, diarrhea, upset stomach Animal/insect bites  3 Easy Ways to Schedule: Walk-In Scheduling Call in scheduling Mychart Sign-up: [email or web address]         

## 2017-08-15 DIAGNOSIS — L299 Pruritus, unspecified: Secondary | ICD-10-CM | POA: Diagnosis not present

## 2017-08-15 DIAGNOSIS — H7291 Unspecified perforation of tympanic membrane, right ear: Secondary | ICD-10-CM | POA: Diagnosis not present

## 2017-08-15 DIAGNOSIS — H9211 Otorrhea, right ear: Secondary | ICD-10-CM | POA: Diagnosis not present

## 2017-08-15 DIAGNOSIS — H6983 Other specified disorders of Eustachian tube, bilateral: Secondary | ICD-10-CM | POA: Diagnosis not present

## 2017-09-17 ENCOUNTER — Other Ambulatory Visit: Payer: Self-pay | Admitting: Family Medicine

## 2017-09-18 NOTE — Telephone Encounter (Signed)
Can we refill this? Looks like was filled a while back.

## 2017-09-19 ENCOUNTER — Telehealth: Payer: Self-pay | Admitting: Family Medicine

## 2017-09-19 NOTE — Telephone Encounter (Signed)
Need for Clarification on Metformin 1000 mg, does pt take 1/2 tablet bid or whole tablet bid?

## 2017-09-23 NOTE — Telephone Encounter (Signed)
I left a voice message for pt to return my call, need to clarify dosage and directions? Also requesting a 90 day supply.

## 2017-09-24 NOTE — Telephone Encounter (Signed)
Can you clarify?

## 2017-09-24 NOTE — Telephone Encounter (Signed)
We decreased the dose of Metformin to 500 mg (or 1/2 tab) bid due to diarrhea last year. To avoid confusion, change the order from 1000 mg to 500 mg to take this bid. Call in #[phone] with 3 rf

## 2017-09-25 ENCOUNTER — Other Ambulatory Visit: Payer: Self-pay | Admitting: Family Medicine

## 2017-09-25 DIAGNOSIS — E119 Type 2 diabetes mellitus without complications: Secondary | ICD-10-CM

## 2017-09-25 MED ORDER — METFORMIN HCL 500 MG PO TABS: 500.0000 mg | ORAL_TABLET | Freq: Two times a day (BID) | ORAL | 0 refills | Status: DC

## 2017-09-25 NOTE — Telephone Encounter (Signed)
Per Dr. Sarajane Jews order A1c and approve a 30 day supply only I put in future lab order, sent new script e-scribe to [facility], removed previous dose from chart. I left a voice message for pt to schedule lab draw.

## 2017-10-11 DIAGNOSIS — E119 Type 2 diabetes mellitus without complications: Secondary | ICD-10-CM | POA: Diagnosis not present

## 2017-10-11 DIAGNOSIS — M549 Dorsalgia, unspecified: Secondary | ICD-10-CM | POA: Diagnosis not present

## 2017-10-28 ENCOUNTER — Telehealth: Payer: Self-pay | Admitting: *Deleted

## 2017-10-28 MED ORDER — METFORMIN HCL 500 MG PO TABS: 500.0000 mg | ORAL_TABLET | Freq: Two times a day (BID) | ORAL | 0 refills | Status: DC

## 2017-10-28 NOTE — Telephone Encounter (Signed)
Spoke with pt she is asking for 1 refill until she can make an apt in January. Rx sent. Dr. Sarajane Jews gave the OK to send Rx.

## 2017-10-28 NOTE — Telephone Encounter (Signed)
Copied from [place]. Topic: Inquiry >> Oct 28, 2017 12:42 PM Conception Chancy, NT wrote: Reason for CRM: pt needs her medicine refilled and she states doctor Sarajane Jews wanted her to have blood work done before the refill and pt wants to wait until the first of the year to come in. Patient would like Dr. Sarajane Jews nurse silvia to give her a call.

## 2017-11-13 ENCOUNTER — Telehealth: Payer: Self-pay | Admitting: Family Medicine

## 2017-11-13 NOTE — Telephone Encounter (Signed)
Copied from [place]. Topic: Inquiry >> Nov 13, 2017  1:05 PM Corie Chiquito, [place] wrote: Reason for CRM: CMS called to check on the status of a a medical release form that was faxed over to the office. If someone could please give them a call back at [phone] or fax the report to [phone]

## 2017-11-14 NOTE — Telephone Encounter (Signed)
Sunday Spillers I looked in media and did not see this do you mind double checking to make sure I didn't miss it. If it is not there should I call the pt to come and fill this form out? Thanks

## 2017-11-14 NOTE — Telephone Encounter (Signed)
I spoke with supply company, Dr. Sarajane Jews signed order form for diabetic shoes and a back brace in October 2018. This company only requires a letter stating why patient would need the shoes and brace, can fax to [phone]. The actual order was scanned look under media. They have that, just need the short letter please.

## 2017-11-14 NOTE — Telephone Encounter (Signed)
The letter was printed

## 2017-11-14 NOTE — Telephone Encounter (Signed)
Sent to PCP ?

## 2017-11-14 NOTE — Telephone Encounter (Signed)
Letter faxed as requested

## 2017-11-15 ENCOUNTER — Other Ambulatory Visit: Payer: Self-pay | Admitting: Family Medicine

## 2017-11-22 ENCOUNTER — Other Ambulatory Visit: Payer: Self-pay | Admitting: Family Medicine

## 2017-11-27 ENCOUNTER — Telehealth: Payer: Self-pay | Admitting: Family Medicine

## 2017-11-27 DIAGNOSIS — M545 Low back pain: Secondary | ICD-10-CM

## 2017-11-27 NOTE — Telephone Encounter (Signed)
Copied from [place]. Topic: Inquiry >> Nov 13, 2017  1:05 PM Corie Chiquito, [place] wrote: Reason for CRM: CMS called to check on the status of a a medical release form that was faxed over to the office. If someone could please give them a call back at [phone] or fax the report to [phone]  >> Nov 27, 2017 10:42 AM Burnis Medin, NT wrote: Madelynn Done called back from [facility] called back and said he received the patients records but there was nothing in the records that is saying the patient needed a back brace or diabetic shoes. Pls fax medical records to [phone]. Call back number is [phone]

## 2017-11-28 NOTE — Telephone Encounter (Signed)
Sent to PCP ?

## 2017-11-29 DIAGNOSIS — M545 Low back pain, unspecified: Secondary | ICD-10-CM | POA: Insufficient documentation

## 2017-11-29 NOTE — Telephone Encounter (Signed)
Please send them the note from 07-30-17 to document the need for these items

## 2017-11-29 NOTE — Telephone Encounter (Signed)
Report was faxed to [phone] @ 4:55 PM. To [facility].

## 2017-12-13 ENCOUNTER — Encounter: Payer: Self-pay | Admitting: Family Medicine

## 2017-12-13 DIAGNOSIS — H25813 Combined forms of age-related cataract, bilateral: Secondary | ICD-10-CM | POA: Diagnosis not present

## 2017-12-13 DIAGNOSIS — E119 Type 2 diabetes mellitus without complications: Secondary | ICD-10-CM | POA: Diagnosis not present

## 2017-12-13 DIAGNOSIS — H04123 Dry eye syndrome of bilateral lacrimal glands: Secondary | ICD-10-CM | POA: Diagnosis not present

## 2017-12-13 DIAGNOSIS — H0289 Other specified disorders of eyelid: Secondary | ICD-10-CM | POA: Diagnosis not present

## 2017-12-13 LAB — HM DIABETES EYE EXAM

## 2017-12-24 ENCOUNTER — Encounter: Payer: Self-pay | Admitting: Family Medicine

## 2017-12-24 ENCOUNTER — Ambulatory Visit (INDEPENDENT_AMBULATORY_CARE_PROVIDER_SITE_OTHER): Payer: Medicare Other | Admitting: Family Medicine

## 2017-12-24 VITALS — BP 134/70 | HR 71 | Temp 98.2°F | Wt 229.2 lb

## 2017-12-24 DIAGNOSIS — I10 Essential (primary) hypertension: Secondary | ICD-10-CM | POA: Diagnosis not present

## 2017-12-24 DIAGNOSIS — Z23 Encounter for immunization: Secondary | ICD-10-CM | POA: Diagnosis not present

## 2017-12-24 DIAGNOSIS — E782 Mixed hyperlipidemia: Secondary | ICD-10-CM | POA: Diagnosis not present

## 2017-12-24 DIAGNOSIS — E119 Type 2 diabetes mellitus without complications: Secondary | ICD-10-CM | POA: Diagnosis not present

## 2017-12-24 LAB — POC URINALSYSI DIPSTICK (AUTOMATED)
Bilirubin, UA: NEGATIVE
Blood, UA: NEGATIVE
Glucose, UA: NEGATIVE
Ketones, UA: NEGATIVE
Nitrite, UA: NEGATIVE
Protein, UA: NEGATIVE
Spec Grav, UA: >=1.03 — AB (ref 1.010–1.025)
UROBILINOGEN UA: 0.2 U/dL
pH, UA: 6 (ref 5.0–8.0)

## 2017-12-24 LAB — LIPID PANEL
CHOL/HDL RATIO: 4
Cholesterol: 241 mg/dL — ABNORMAL HIGH (ref 0–200)
HDL: 57.1 mg/dL (ref >39.00)
LDL CALC: 157 mg/dL — AB (ref 0–99)
NONHDL: 183.52
Triglycerides: 134 mg/dL (ref 0.0–149.0)
VLDL: 26.8 mg/dL (ref 0.0–40.0)

## 2017-12-24 LAB — CBC WITH DIFFERENTIAL/PLATELET
BASOS PCT: 0.5 % (ref 0.0–3.0)
Basophils Absolute: 0 10*3/uL (ref 0.0–0.1)
EOS ABS: 0.2 10*3/uL (ref 0.0–0.7)
Eosinophils Relative: 2.7 % (ref 0.0–5.0)
HEMATOCRIT: 40.6 % (ref 36.0–46.0)
Hemoglobin: 13.3 g/dL (ref 12.0–15.0)
LYMPHS ABS: 2.3 10*3/uL (ref 0.7–4.0)
LYMPHS PCT: 33.3 % (ref 12.0–46.0)
MCHC: 32.7 g/dL (ref 30.0–36.0)
MCV: 95.8 fl (ref 78.0–100.0)
MONO ABS: 0.7 10*3/uL (ref 0.1–1.0)
Monocytes Relative: 9.8 % (ref 3.0–12.0)
NEUTROS ABS: 3.7 10*3/uL (ref 1.4–7.7)
Neutrophils Relative %: 53.7 % (ref 43.0–77.0)
PLATELETS: 214 10*3/uL (ref 150.0–400.0)
RBC: 4.24 Mil/uL (ref 3.87–5.11)
RDW: 14.7 % (ref 11.5–15.5)
WBC: 7 10*3/uL (ref 4.0–10.5)

## 2017-12-24 LAB — HEPATIC FUNCTION PANEL
ALT: 16 U/L (ref 0–35)
AST: 14 U/L (ref 0–37)
Albumin: 3.8 g/dL (ref 3.5–5.2)
Alkaline Phosphatase: 67 U/L (ref 39–117)
BILIRUBIN DIRECT: 0.1 mg/dL (ref 0.0–0.3)
BILIRUBIN TOTAL: 0.3 mg/dL (ref 0.2–1.2)
Total Protein: 7 g/dL (ref 6.0–8.3)

## 2017-12-24 LAB — BASIC METABOLIC PANEL
BUN: 24 mg/dL — ABNORMAL HIGH (ref 6–23)
CALCIUM: 9.8 mg/dL (ref 8.4–10.5)
CHLORIDE: 106 meq/L (ref 96–112)
CO2: 28 mEq/L (ref 19–32)
CREATININE: 0.77 mg/dL (ref 0.40–1.20)
GFR: 95.03 mL/min (ref >60.00)
Glucose, Bld: 87 mg/dL (ref 70–99)
Potassium: 4.2 mEq/L (ref 3.5–5.1)
Sodium: 141 mEq/L (ref 135–145)

## 2017-12-24 LAB — TSH: TSH: 0.49 u[IU]/mL (ref 0.35–4.50)

## 2017-12-24 LAB — HEMOGLOBIN A1C: Hgb A1c MFr Bld: 6.8 % — ABNORMAL HIGH (ref 4.6–6.5)

## 2017-12-24 MED ORDER — METOPROLOL SUCCINATE ER 50 MG PO TB24: 50.0000 mg | ORAL_TABLET | Freq: Every day | ORAL | 11 refills | Status: DC

## 2017-12-24 MED ORDER — METFORMIN HCL 500 MG PO TABS: 500.0000 mg | ORAL_TABLET | Freq: Two times a day (BID) | ORAL | 11 refills | Status: DC

## 2017-12-24 MED ORDER — SPIRONOLACTONE 25 MG PO TABS: 25.0000 mg | ORAL_TABLET | Freq: Every day | ORAL | 11 refills | Status: DC

## 2017-12-24 MED ORDER — PIOGLITAZONE HCL 30 MG PO TABS: 30.0000 mg | ORAL_TABLET | Freq: Every day | ORAL | 11 refills | Status: DC

## 2017-12-24 MED ORDER — ROSUVASTATIN CALCIUM 40 MG PO TABS: ORAL_TABLET | ORAL | 11 refills | Status: DC

## 2017-12-24 MED ORDER — DICLOFENAC SODIUM 75 MG PO TBEC: 75.0000 mg | DELAYED_RELEASE_TABLET | Freq: Two times a day (BID) | ORAL | 11 refills | Status: DC

## 2017-12-24 MED ORDER — GLIPIZIDE 10 MG PO TABS: 10.0000 mg | ORAL_TABLET | Freq: Two times a day (BID) | ORAL | 11 refills | Status: DC

## 2017-12-24 MED ORDER — LISINOPRIL 10 MG PO TABS: 20.0000 mg | ORAL_TABLET | Freq: Every day | ORAL | 11 refills | Status: DC

## 2017-12-24 MED ORDER — ONETOUCH ULTRASOFT LANCETS MISC
3 refills | Status: DC
Start: 2017-12-24 — End: 2020-01-15

## 2017-12-24 NOTE — Progress Notes (Signed)
   Subjective:    Patient ID: [name], female    DOB: [birth year], 72 y.o.   MRN: [id number]  HPI Here for follow up. She feels well. Her BP has been stable.    Review of Systems  Constitutional: Negative.   Respiratory: Negative.   Cardiovascular: Negative.   Gastrointestinal: Negative.   Neurological: Negative.        Objective:   Physical Exam  Constitutional: She is oriented to person, place, and time. She appears well-developed and well-nourished.  Cardiovascular: Normal rate, regular rhythm, normal heart sounds and intact distal pulses.  Pulmonary/Chest: Effort normal and breath sounds normal. No respiratory distress. She has no wheezes. She has no rales.  Neurological: She is alert and oriented to person, place, and time.          Assessment & Plan:  Her HTN is stable. We will get fasting labs to check lipids, A1c, etc.  Alysia Penna, MD

## 2017-12-30 ENCOUNTER — Telehealth: Payer: Self-pay | Admitting: Family Medicine

## 2017-12-30 NOTE — Telephone Encounter (Signed)
Pt given results of labs by [facility], per Dr Sarajane Jews; pt verbalizes understanding' not documented in result note because not routed to [facility].

## 2018-01-08 ENCOUNTER — Telehealth: Payer: Self-pay

## 2018-01-14 NOTE — Progress Notes (Addendum)
Subjective:   [name] is a 72 y.o. female who presents for Medicare Annual (Subsequent) preventive examination.  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 FOOT EXAM  12/14/1956  . Fecal DNA (Cologuard)  12/14/1996    Agrees to Hep C net blood draw  Mammogram 09/2016  To call and schedule an apt per the patient Had foot exam recently and no issues  At this time per her report  Cologuard on hold; states she has a lot of diarrhea type stool and has not completed the test  Declined colonoscopy  Dexa was done and patient reports normal  She declined repeat but understands that we do not have a copy of her report.    Tobacco; current smoker 23.5 pack years;  Would like to quit but not motivated presently  She agrees she would save  Money if she quit  Does not meet guideline for LDCT     Objective:     Vitals: BP (!) 144/60   Pulse (!) 52   Ht 5\' 5"  (1.651 m)   Wt 232 lb 4 oz (105.3 kg)   SpO2 96%   BMI 38.65 kg/m   Body mass index is 38.65 kg/m.  Advanced Directives 01/15/2018 02/14/2017 11/28/2016  Does Patient Have a Medical Advance Directive? No No No  Would patient like information on creating a medical advance directive? - No - Patient declined -   If something would happen her mother would make decision for her; she is still independent 19;  She does not want to completed AD    Tobacco Social History   Tobacco Use  Smoking Status Former Smoker  . Packs/day: 0.50  . Years: 48.00  . Pack years: 24.00  . Types: Cigarettes  . Start date: 11/26/1968  Smokeless Tobacco Never Used  Tobacco Comment   trying to quit;  not really motivated at present      Counseling given: Yes Comment: trying to quit;  not really motivated at present per her admission  Clinical Intake:     Past Medical History:  Diagnosis Date  . Acute bronchitis 07/22/2008  . Diabetes mellitus without complication ([place]) 0/27/2536  . 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 Hypertension    Past Surgical History:  Procedure Laterality Date  . 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 NECK SURGERY     Remove a tumor  . TYMPANOSTOMY     Dr. Thornell Mule   Family History  Problem Relation Age of Onset  . Heart disease Father        No details   Social History   Socioeconomic History  . Marital status: Widowed    Spouse name: Not on file  . Number of children: 0  . Years of  education: Not on file  . Highest education level: Not on file  Social Needs  . Financial resource strain: Not on file  . Food insecurity - worry: Not on file  . Food insecurity - inability: Not on file  . Transportation needs - medical: Not on file  . Transportation needs - non-medical: Not on file  Occupational History  . Not on file  Tobacco Use  . Smoking status: Former Smoker    Packs/day: 0.50    Years: 48.00    Pack years: 24.00    Types: Cigarettes    Start date: 11/26/1968  . Smokeless tobacco: Never Used  . Tobacco comment: trying to quit;  not really motivated at present  Substance and Sexual Activity  . Alcohol use: Yes    Alcohol/week: 0.0 oz    Comment: rare  . Drug use: No  . Sexual activity: Not on file  Other Topics Concern  . Not on file  Social History Narrative   Lives with the dog Toby.      Outpatient Encounter Medications as of 01/15/2018  Medication Sig  . Alcohol  Swabs (ALCOHOL PREP) PADS Use to test 1 X day and diagnosis code is E 11.9  . amLODipine (NORVASC) 10 MG tablet TAKE ONE TABLET BY MOUTH ONCE DAILY  . glipiZIDE (GLUCOTROL) 10 MG tablet Take 1 tablet (10 mg total) by mouth 2 (two) times daily before a meal.  . glucose blood (ONETOUCH VERIO) test strip Test 1 x day and diagnosis code is E 11.9  . Lancets (ONETOUCH ULTRASOFT) lancets Test 1 X day and diagnosis code is E 11.9  . lisinopril (PRINIVIL,ZESTRIL) 10 MG tablet Take 2 tablets (20 mg total) by mouth daily.  . metFORMIN (GLUCOPHAGE) 500 MG tablet Take 1 tablet (500 mg total) by mouth 2 (two) times daily with a meal.  . metoprolol succinate (TOPROL-XL) 50 MG 24 hr tablet Take 1 tablet (50 mg total) by mouth daily. Take with or immediately following a meal.  . pioglitazone (ACTOS) 30 MG tablet Take 1 tablet (30 mg total) by mouth daily.  . rosuvastatin (CRESTOR) 40 MG tablet TAKE 1 TABLET BY MOUTH ONCE DAILY **OVERDUE  FOR  OFFICE  VISIT  APPOINTMENT  TO  RECHECK  CHOLESTEROL**  . spironolactone (ALDACTONE) 25 MG tablet Take 1 tablet (25 mg total) by mouth daily.  . diclofenac (VOLTAREN) 75 MG EC tablet Take 1 tablet (75 mg total) by mouth 2 (two) times daily. (Patient not taking: Reported on 01/15/2018)   No facility-administered encounter medications on file as of 01/15/2018.     Activities of Daily Living In your present state of health, do you have any difficulty performing the following activities: 01/15/2018  Hearing? Y  Comment evaluated   Vision? N  Difficulty concentrating or making decisions? N  Walking or climbing stairs? N  Dressing or bathing? N  Doing errands, shopping? N  Preparing Food and eating ? N  Using the Toilet? N  In the past six months, have you accidently leaked urine? Y  Do you have problems with loss of bowel control? N  Managing your Medications? N  Managing your Finances? N  Housekeeping or managing your Housekeeping? N  Some recent data might be hidden     Patient Care Team: Laurey Morale, MD as PCP - General    Assessment:   This is a routine wellness examination for [name].  Exercise Activities and Dietary recommendations    Goals    .  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 Weight (lb) < 200 lb (90.7 kg)     Cut back on portions and get eating under control           Fall Risk Fall Risk  01/15/2018 02/14/2017 11/28/2016 07/23/2016 10/11/2014  Falls in the past year? Yes No Yes No No  Comment - - doesn't know how; dog may have tripped her; did not get hurt  - -  Number falls in past yr: 1 - - - -  Injury with Fall? Yes - No - -  Follow up Education provided - - - -    Depression Screen PHQ 2/9 Scores 01/15/2018 02/14/2017 11/28/2016 07/23/2016  PHQ - 2 Score 0 0 0 0     Cognitive Function   Ad8 score reviewed for issues:  Issues making decisions:  Less interest in hobbies / activities:  Repeats questions, stories (family complaining):  Trouble using ordinary gadgets (microwave, computer, phone):  Forgets the month or year:   Mismanaging finances:   Remembering appts:  Daily problems with thinking and/or memory: Ad8 score is=0          Immunization History  Administered Date(s) Administered  . Influenza, High Dose Seasonal PF 10/17/2015, 11/28/2016, 12/24/2017  . Influenza, Seasonal, Injecte, Preservative Fre 12/01/2012  . Influenza,inj,Quad PF,6+ Mos 09/07/2013, 10/11/2014  . Td 12/22/2007  . Tdap 07/16/2017     Screening Tests Health Maintenance  Topic Date Due  . FOOT EXAM  12/14/1955  . Fecal DNA (Cologuard)  12/14/1996  . PNA vac Low Risk Adult (1 of 2 - PCV13) 01/24/2018 (Originally 12/15/2011)  . DEXA SCAN  01/15/2019 (Originally 12/15/2011)  . HEMOGLOBIN A1C  06/23/2018  . MAMMOGRAM  10/01/2018  . OPHTHALMOLOGY EXAM  12/13/2018  . TETANUS/TDAP  07/17/2027  . INFLUENZA VACCINE  Completed  . Hepatitis  C Screening  Completed         Plan:      PCP Notes   Health Maintenance Educated regarding her shingrix  She will schedule her mammogram  Declined dexa; states she had one but it was normal and does not want to repeat at this time  Agreed to hep C  Educated on resources;  [place] for her to get out and socialize   Smoking cessation resources given   Colo-guard; will not complete. States the metformin gives her diarrhea and she does not want to complete it currently. Declines colonoscopy   Educated regarding shingrix  Discussed Pneumonia series; will check with the pharmacy to see if she took them at that time. Agreed to take them if she has not had them but thinks she has.   Abnormal Screens  none  Referrals  none  Patient concerns; Had some pain lower back; OTC med which helped  Nurse Concerns;  As noted  Next PCP apt 12/24/2017      I have personally reviewed and noted the following in the patient's chart:   . Medical and social history . Use of alcohol, tobacco or illicit drugs  . Current medications and supplements . Functional ability and status . Nutritional status . Physical activity . Advanced directives . List of other physicians . Hospitalizations, surgeries, and ER visits in previous 12 months . Vitals . Screenings to include cognitive, depression, and falls . Referrals and appointments  In addition, I have reviewed  and discussed with patient certain preventive protocols, quality metrics, and best practice recommendations. A written personalized care plan for preventive services as well as general preventive health recommendations were provided to patient.     [id number],LSLHT, RN  01/15/2018   I have read this note and agree with its contents.  Alysia Penna, MD

## 2018-01-15 ENCOUNTER — Ambulatory Visit (INDEPENDENT_AMBULATORY_CARE_PROVIDER_SITE_OTHER): Payer: Medicare Other

## 2018-01-15 VITALS — BP 144/60 | HR 52 | Ht 65.0 in | Wt 232.2 lb

## 2018-01-15 DIAGNOSIS — Z0000 Encounter for general adult medical examination without abnormal findings: Secondary | ICD-10-CM | POA: Diagnosis not present

## 2018-01-15 NOTE — Patient Instructions (Addendum)
[name] , Thank you for taking time to come for your Medicare Wellness Visit. I appreciate your ongoing commitment to your health goals. Please review the following plan we discussed and let me know if I can assist you in the future.   Will call and schedule your mammogram   Shingrix is a vaccine for the prevention of Shingles in Adults 50 and older.  If you are on Medicare, you can request a prescription from your doctor to be filled at a pharmacy.  Please check with your benefits regarding applicable copays or out of pocket expenses.  The Shingrix is given in 2 vaccines approx 8 weeks apart. You must receive the 2nd dose prior to 6 months from receipt of the first.    Waive the dexa for now. If you fall, or fracture a bone, we will do one Recommendations for Dexa Scan Female over the age of 72 Man age 31 or older If you broke a bone past the age of 75 Women menopausal age with risk factors (thin frame; smoker; hx of fx ) Post menopausal women under the age of 21 with risk factors A man age 70 to 10 with risk factors Other: Spine xray that is showing break of bone loss Back pain with possible break Height loss of 1/2 inch or more within one year Total loss in height of 1.5 inches from your original height  Calcium 1239m with Vit D 800u per day; more as directed by physician Strength building exercises discussed; can include walking; housework; small weights or stretch bands; silver sneakers if access to the Y  Please visit the osteoporosis foundation.org for up to date recommendations    Medicare now request all "baby boomers" test for possible exposure to Hepatitis C. Many may have been exposed due to dental work, tatoo's, vaccinations when young. The Hepatitis C virus is dormant for many years and then sometimes will cause liver cancer. If you gave blood in the past 15 years, you were most likely checked for Hep C. If you rec'd blood; you may want to consider testing or if  you are high risk for any other reason.   [facility]  1 review  [phone]  ([phone] Website  Directions  [facility]  No reviews  Association or Organization  [place] ([phone] Closing soon: 4:00 PM  Website  Directions   [facility]  No reviews  [employer] [place] ([phone] Website  Directions    Smoking;  Educated to avoid secondary smoke Smoking cessation at [facility] [phone] offered a couple of times a month;  Will work with the patient as far as registration and location  Meds may help; chatix (Varenicline); Zyban (Bupropion SR); Nicotine Replacement (gum; lozenges; patches; etc.)   30 pack yr smoking hx: Educated regarding LDCT; To discuss with MD at next fup. Also educated on AAA screening for men 65-75 who have smoked Also have [place] quit line  Call [facility] is available 24/7 toll-free at 1-800-QUIT-NOW ([phone].  Interpretation services available for many languages.  Spanish: 1-855-Dejelo-Ya ([phone]  TTY:: [phone] OR save on time and phone minutes by registering online.     These are the goals we discussed: Goals    . Patient Stated     Go back to the gym; [name] Will take exercise class, silver sneaker  Ride the bike        . Weight (lb) < 200 lb (90.7 kg)     Cut back on portions and get eating under control           This is a list of the screening recommended for you and due dates:  Health Maintenance  Topic Date Due  .  Hepatitis C: One time screening is recommended by Center for Disease Control  (CDC) for  adults born from [birth year] through 1965.   [birth year]  . Complete foot exam   12/14/1956  . Cologuard (Stool DNA test)  12/14/1996  . DEXA scan (bone density measurement)  12/15/2011  . Pneumonia vaccines (1 of 2 - PCV13)  01/24/2018*  . Hemoglobin A1C  06/23/2018  . Mammogram  10/01/2018  . Eye exam for diabetics  12/13/2018  . Tetanus Vaccine  07/17/2027  . Flu Shot  Completed  *Topic was postponed. The date shown is not the original due date.      Fall Prevention in the Home Falls can cause injuries. They can happen to people of all ages. There are many things you can do to make your home safe and to help prevent falls. What can I do on the outside of my home?  Regularly fix the edges of walkways and driveways and fix any cracks.  Remove anything that might make you trip as you walk through a door, such as a raised step or threshold.  Trim any bushes or trees on the path to your home.  Use bright outdoor lighting.  Clear any walking paths of anything that might make someone trip, such as rocks or tools.  Regularly check to see if handrails are loose or broken. Make sure that both sides of any steps have handrails.  Any raised decks and porches should have guardrails on the edges.  Have any leaves, snow, or ice cleared regularly.  Use sand or salt on walking paths during winter.  Clean up any spills in your garage right away. This includes oil or grease spills. What can I do in the bathroom?  Use night lights.  Install grab bars by the toilet and in the tub and shower. Do not use towel bars as grab bars.  Use non-skid mats or decals in the tub or shower.  If you need to sit down in the shower, use a plastic, non-slip stool.  Keep the floor dry. Clean up any water that spills on the floor as soon as it happens.  Remove soap buildup in the tub or shower regularly.  Attach bath mats securely with double-sided non-slip rug tape.  Do not have throw rugs and other things on the floor that can make you trip. What can I do in the bedroom?  Use night lights.  Make sure that you have a light by your bed that is easy to reach.  Do not use any sheets or blankets that are too big for your  bed. They should not hang down onto the floor.  Have a firm chair that has side arms. You can use this for support while you get dressed.  Do not have throw rugs and other things on the floor that can make you trip. What can I do in the kitchen?  Clean up any spills right away.  Avoid walking on wet floors.  Keep items that you use a lot in easy-to-reach places.  If you need to reach something above you, use a strong step stool  that has a grab bar.  Keep electrical cords out of the way.  Do not use floor polish or wax that makes floors slippery. If you must use wax, use non-skid floor wax.  Do not have throw rugs and other things on the floor that can make you trip. What can I do with my stairs?  Do not leave any items on the stairs.  Make sure that there are handrails on both sides of the stairs and use them. Fix handrails that are broken or loose. Make sure that handrails are as long as the stairways.  Check any carpeting to make sure that it is firmly attached to the stairs. Fix any carpet that is loose or worn.  Avoid having throw rugs at the top or bottom of the stairs. If you do have throw rugs, attach them to the floor with carpet tape.  Make sure that you have a light switch at the top of the stairs and the bottom of the stairs. If you do not have them, ask someone to add them for you. What else can I do to help prevent falls?  Wear shoes that: ? Do not have high heels. ? Have rubber bottoms. ? Are comfortable and fit you well. ? Are closed at the toe. Do not wear sandals.  If you use a stepladder: ? Make sure that it is fully opened. Do not climb a closed stepladder. ? Make sure that both sides of the stepladder are locked into place. ? Ask someone to hold it for you, if possible.  Clearly mark and make sure that you can see: ? Any grab bars or handrails. ? First and last steps. ? Where the edge of each step is.  Use tools that help you move around (mobility  aids) if they are needed. These include: ? Canes. ? Walkers. ? Scooters. ? Crutches.  Turn on the lights when you go into a dark area. Replace any light bulbs as soon as they burn out.  Set up your furniture so you have a clear path. Avoid moving your furniture around.  If any of your floors are uneven, fix them.  If there are any pets around you, be aware of where they are.  Review your medicines with your doctor. Some medicines can make you feel dizzy. This can increase your chance of falling. Ask your doctor what other things that you can do to help prevent falls. This information is not intended to replace advice given to you by your health care provider. Make sure you discuss any questions you have with your health care provider. Document Released: 09/08/2009 Document Revised: 04/19/2016 Document Reviewed: 12/17/2014 Elsevier Interactive Patient Education  2018 [place] Maintenance, Female Adopting a healthy lifestyle and getting preventive care can go a long way to promote health and wellness. Talk with your health care provider about what schedule of regular examinations is right for you. This is a good chance for you to check in with your provider about disease prevention and staying healthy. In between checkups, there are plenty of things you can do on your own. Experts have done a lot of research about which lifestyle changes and preventive measures are most likely to keep you healthy. Ask your health care provider for more information. Weight and diet Eat a healthy diet  Be sure to include plenty of vegetables, fruits, low-fat dairy products, and lean protein.  Do not eat a lot of foods high in solid fats, added sugars,  or salt.  Get regular exercise. This is one of the most important things you can do for your health. ? Most adults should exercise for at least 150 minutes each week. The exercise should increase your heart rate and make you sweat  (moderate-intensity exercise). ? Most adults should also do strengthening exercises at least twice a week. This is in addition to the moderate-intensity exercise.  Maintain a healthy weight  Body mass index (BMI) is a measurement that can be used to identify possible weight problems. It estimates body fat based on height and weight. Your health care provider can help determine your BMI and help you achieve or maintain a healthy weight.  For females 54 years of age and older: ? A BMI below 18.5 is considered underweight. ? A BMI of 18.5 to 24.9 is normal. ? A BMI of 25 to 29.9 is considered overweight. ? A BMI of 30 and above is considered obese.  Watch levels of cholesterol and blood lipids  You should start having your blood tested for lipids and cholesterol at 71 years of age, then have this test every 5 years.  You may need to have your cholesterol levels checked more often if: ? Your lipid or cholesterol levels are high. ? You are older than 71 years of age. ? You are at high risk for heart disease.  Cancer screening Lung Cancer  Lung cancer screening is recommended for adults 60-54 years old who are at high risk for lung cancer because of a history of smoking.  A yearly low-dose CT scan of the lungs is recommended for people who: ? Currently smoke. ? Have quit within the past 15 years. ? Have at least a 30-pack-year history of smoking. A pack year is smoking an average of one pack of cigarettes a day for 1 year.  Yearly screening should continue until it has been 15 years since you quit.  Yearly screening should stop if you develop a health problem that would prevent you from having lung cancer treatment.  Breast Cancer  Practice breast self-awareness. This means understanding how your breasts normally appear and feel.  It also means doing regular breast self-exams. Let your health care provider know about any changes, no matter how small.  If you are in your 20s or  30s, you should have a clinical breast exam (CBE) by a health care provider every 1-3 years as part of a regular health exam.  If you are 45 or older, have a CBE every year. Also consider having a breast X-ray (mammogram) every year.  If you have a family history of breast cancer, talk to your health care provider about genetic screening.  If you are at high risk for breast cancer, talk to your health care provider about having an MRI and a mammogram every year.  Breast cancer gene (BRCA) assessment is recommended for women who have family members with BRCA-related cancers. BRCA-related cancers include: ? Breast. ? Ovarian. ? Tubal. ? Peritoneal cancers.  Results of the assessment will determine the need for genetic counseling and BRCA1 and BRCA2 testing.  Cervical Cancer Your health care provider may recommend that you be screened regularly for cancer of the pelvic organs (ovaries, uterus, and vagina). This screening involves a pelvic examination, including checking for microscopic changes to the surface of your cervix (Pap test). You may be encouraged to have this screening done every 3 years, beginning at age 70.  For women ages 103-65, health care providers may recommend pelvic  exams and Pap testing every 3 years, or they may recommend the Pap and pelvic exam, combined with testing for human papilloma virus (HPV), every 5 years. Some types of HPV increase your risk of cervical cancer. Testing for HPV may also be done on women of any age with unclear Pap test results.  Other health care providers may not recommend any screening for nonpregnant women who are considered low risk for pelvic cancer and who do not have symptoms. Ask your health care provider if a screening pelvic exam is right for you.  If you have had past treatment for cervical cancer or a condition that could lead to cancer, you need Pap tests and screening for cancer for at least 20 years after your treatment. If Pap tests  have been discontinued, your risk factors (such as having a new sexual partner) need to be reassessed to determine if screening should resume. Some women have medical problems that increase the chance of getting cervical cancer. In these cases, your health care provider may recommend more frequent screening and Pap tests.  Colorectal Cancer  This type of cancer can be detected and often prevented.  Routine colorectal cancer screening usually begins at 71 years of age and continues through 71 years of age.  Your health care provider may recommend screening at an earlier age if you have risk factors for colon cancer.  Your health care provider may also recommend using home test kits to check for hidden blood in the stool.  A small camera at the end of a tube can be used to examine your colon directly (sigmoidoscopy or colonoscopy). This is done to check for the earliest forms of colorectal cancer.  Routine screening usually begins at age 79.  Direct examination of the colon should be repeated every 5-10 years through 71 years of age. However, you may need to be screened more often if early forms of precancerous polyps or small growths are found.  Skin Cancer  Check your skin from head to toe regularly.  Tell your health care provider about any new moles or changes in moles, especially if there is a change in a mole's shape or color.  Also tell your health care provider if you have a mole that is larger than the size of a pencil eraser.  Always use sunscreen. Apply sunscreen liberally and repeatedly throughout the day.  Protect yourself by wearing long sleeves, pants, a wide-brimmed hat, and sunglasses whenever you are outside.  Heart disease, diabetes, and high blood pressure  High blood pressure causes heart disease and increases the risk of stroke. High blood pressure is more likely to develop in: ? People who have blood pressure in the high end of the normal range (130-139/85-89 mm  Hg). ? People who are overweight or obese. ? People who are African American.  If you are 55-34 years of age, have your blood pressure checked every 3-5 years. If you are 27 years of age or older, have your blood pressure checked every year. You should have your blood pressure measured twice-once when you are at a hospital or clinic, and once when you are not at a hospital or clinic. Record the average of the two measurements. To check your blood pressure when you are not at a hospital or clinic, you can use: ? An automated blood pressure machine at a pharmacy. ? A home blood pressure monitor.  If you are between 20 years and 18 years old, ask your health care provider if  you should take aspirin to prevent strokes.  Have regular diabetes screenings. This involves taking a blood sample to check your fasting blood sugar level. ? If you are at a normal weight and have a low risk for diabetes, have this test once every three years after 71 years of age. ? If you are overweight and have a high risk for diabetes, consider being tested at a younger age or more often. Preventing infection Hepatitis B  If you have a higher risk for hepatitis B, you should be screened for this virus. You are considered at high risk for hepatitis B if: ? You were born in a country where hepatitis B is common. Ask your health care provider which countries are considered high risk. ? Your parents were born in a high-risk country, and you have not been immunized against hepatitis B (hepatitis B vaccine). ? You have HIV or AIDS. ? You use needles to inject street drugs. ? You live with someone who has hepatitis B. ? You have had sex with someone who has hepatitis B. ? You get hemodialysis treatment. ? You take certain medicines for conditions, including cancer, organ transplantation, and autoimmune conditions.  Hepatitis C  Blood testing is recommended for: ? Everyone born from [birth year] through 1964. ? Anyone with known  risk factors for hepatitis C.  Sexually transmitted infections (STIs)  You should be screened for sexually transmitted infections (STIs) including gonorrhea and chlamydia if: ? You are sexually active and are younger than 71 years of age. ? You are older than 71 years of age and your health care provider tells you that you are at risk for this type of infection. ? Your sexual activity has changed since you were last screened and you are at an increased risk for chlamydia or gonorrhea. Ask your health care provider if you are at risk.  If you do not have HIV, but are at risk, it may be recommended that you take a prescription medicine daily to prevent HIV infection. This is called pre-exposure prophylaxis (PrEP). You are considered at risk if: ? You are sexually active and do not regularly use condoms or know the HIV status of your partner(s). ? You take drugs by injection. ? You are sexually active with a partner who has HIV.  Talk with your health care provider about whether you are at high risk of being infected with HIV. If you choose to begin PrEP, you should first be tested for HIV. You should then be tested every 3 months for as long as you are taking PrEP. Pregnancy  If you are premenopausal and you may become pregnant, ask your health care provider about preconception counseling.  If you may become pregnant, take 400 to 800 micrograms (mcg) of folic acid every day.  If you want to prevent pregnancy, talk to your health care provider about birth control (contraception). Osteoporosis and menopause  Osteoporosis is a disease in which the bones lose minerals and strength with aging. This can result in serious bone fractures. Your risk for osteoporosis can be identified using a bone density scan.  If you are 66 years of age or older, or if you are at risk for osteoporosis and fractures, ask your health care provider if you should be screened.  Ask your health care provider whether you  should take a calcium or vitamin D supplement to lower your risk for osteoporosis.  Menopause may have certain physical symptoms and risks.  Hormone replacement therapy may reduce some  of these symptoms and risks. Talk to your health care provider about whether hormone replacement therapy is right for you. Follow these instructions at home:  Schedule regular health, dental, and eye exams.  Stay current with your immunizations.  Do not use any tobacco products including cigarettes, chewing tobacco, or electronic cigarettes.  If you are pregnant, do not drink alcohol.  If you are breastfeeding, limit how much and how often you drink alcohol.  Limit alcohol intake to no more than 1 drink per day for nonpregnant women. One drink equals 12 ounces of beer, 5 ounces of wine, or 1 ounces of hard liquor.  Do not use street drugs.  Do not share needles.  Ask your health care provider for help if you need support or information about quitting drugs.  Tell your health care provider if you often feel depressed.  Tell your health care provider if you have ever been abused or do not feel safe at home. This information is not intended to replace advice given to you by your health care provider. Make sure you discuss any questions you have with your health care provider. Document Released: 05/28/2011 Document Revised: 04/19/2016 Document Reviewed: 08/16/2015 Elsevier Interactive Patient Education  [employer].

## 2018-02-28 NOTE — Telephone Encounter (Signed)
Done

## 2018-03-06 ENCOUNTER — Encounter: Payer: Self-pay | Admitting: Family Medicine

## 2018-03-06 DIAGNOSIS — H25813 Combined forms of age-related cataract, bilateral: Secondary | ICD-10-CM | POA: Diagnosis not present

## 2018-03-06 DIAGNOSIS — H0289 Other specified disorders of eyelid: Secondary | ICD-10-CM | POA: Diagnosis not present

## 2018-03-06 DIAGNOSIS — H04123 Dry eye syndrome of bilateral lacrimal glands: Secondary | ICD-10-CM | POA: Diagnosis not present

## 2018-03-06 DIAGNOSIS — E119 Type 2 diabetes mellitus without complications: Secondary | ICD-10-CM | POA: Diagnosis not present

## 2018-03-06 LAB — HM DIABETES EYE EXAM

## 2018-07-02 ENCOUNTER — Other Ambulatory Visit: Payer: Self-pay | Admitting: Family Medicine

## 2018-07-02 NOTE — Telephone Encounter (Signed)
Last OV 12/24/2017   Due for amlodipine  Need approval to refill ear drops   Sent to PCP to advise

## 2018-08-19 ENCOUNTER — Encounter: Payer: Self-pay | Admitting: Family Medicine

## 2018-08-19 DIAGNOSIS — Z1231 Encounter for screening mammogram for malignant neoplasm of breast: Secondary | ICD-10-CM | POA: Diagnosis not present

## 2018-09-01 ENCOUNTER — Other Ambulatory Visit: Payer: Self-pay

## 2018-09-01 NOTE — Patient Outreach (Signed)
[facility]) Care Management  09/01/2018  JULIO STORR [birth year] [id number]   Medication Adherence call to Mrs. [name] left a message for patient to call back patient is due on Lisinopril 10 mg. Mrs. Dethlefs is showing past due under [place].   [place] Management Direct Dial [phone]  Fax [phone] [email or web address]

## 2018-09-02 ENCOUNTER — Other Ambulatory Visit: Payer: Self-pay

## 2018-09-02 NOTE — Patient Outreach (Signed)
[place] [facility]) Care Management  09/02/2018  [name] 1946-09-13 [id number]   Patient call back patient said she still has medication on Lisinopril she has a full bottle she is showing past due for 81 days under [place]. [name] will be send to one of the pharmacist to better assist her with medication adherence.    [facility] Management Direct Dial [phone]  Fax [phone] [email or web address]

## 2018-10-15 ENCOUNTER — Ambulatory Visit (INDEPENDENT_AMBULATORY_CARE_PROVIDER_SITE_OTHER): Payer: Medicare Other | Admitting: Family Medicine

## 2018-10-15 ENCOUNTER — Encounter: Payer: Self-pay | Admitting: Family Medicine

## 2018-10-15 VITALS — BP 122/68 | HR 57 | Temp 97.8°F | Wt 236.0 lb

## 2018-10-15 DIAGNOSIS — I10 Essential (primary) hypertension: Secondary | ICD-10-CM

## 2018-10-15 DIAGNOSIS — E119 Type 2 diabetes mellitus without complications: Secondary | ICD-10-CM | POA: Diagnosis not present

## 2018-10-15 DIAGNOSIS — J069 Acute upper respiratory infection, unspecified: Secondary | ICD-10-CM

## 2018-10-15 LAB — HEPATIC FUNCTION PANEL
ALT: 16 U/L (ref 0–35)
AST: 14 U/L (ref 0–37)
Albumin: 4 g/dL (ref 3.5–5.2)
Alkaline Phosphatase: 52 U/L (ref 39–117)
BILIRUBIN TOTAL: 0.3 mg/dL (ref 0.2–1.2)
Bilirubin, Direct: 0 mg/dL (ref 0.0–0.3)
Total Protein: 7 g/dL (ref 6.0–8.3)

## 2018-10-15 LAB — LIPID PANEL
CHOL/HDL RATIO: 3
Cholesterol: 169 mg/dL (ref 0–200)
HDL: 56.7 mg/dL (ref >39.00)
LDL Cholesterol: 94 mg/dL (ref 0–99)
NonHDL: 111.8
TRIGLYCERIDES: 91 mg/dL (ref 0.0–149.0)
VLDL: 18.2 mg/dL (ref 0.0–40.0)

## 2018-10-15 LAB — CBC WITH DIFFERENTIAL/PLATELET
Basophils Absolute: 0 10*3/uL (ref 0.0–0.1)
Basophils Relative: 0.5 % (ref 0.0–3.0)
EOS PCT: 2.8 % (ref 0.0–5.0)
Eosinophils Absolute: 0.2 10*3/uL (ref 0.0–0.7)
HCT: 37.5 % (ref 36.0–46.0)
HEMOGLOBIN: 12.4 g/dL (ref 12.0–15.0)
Lymphocytes Relative: 33 % (ref 12.0–46.0)
Lymphs Abs: 2.2 10*3/uL (ref 0.7–4.0)
MCHC: 33 g/dL (ref 30.0–36.0)
MCV: 95.4 fl (ref 78.0–100.0)
MONO ABS: 0.9 10*3/uL (ref 0.1–1.0)
MONOS PCT: 13.1 % — AB (ref 3.0–12.0)
Neutro Abs: 3.3 10*3/uL (ref 1.4–7.7)
Neutrophils Relative %: 50.6 % (ref 43.0–77.0)
Platelets: 217 10*3/uL (ref 150.0–400.0)
RBC: 3.93 Mil/uL (ref 3.87–5.11)
RDW: 15.3 % (ref 11.5–15.5)
WBC: 6.6 10*3/uL (ref 4.0–10.5)

## 2018-10-15 LAB — BASIC METABOLIC PANEL
BUN: 39 mg/dL — AB (ref 6–23)
CO2: 26 mEq/L (ref 19–32)
CREATININE: 1.1 mg/dL (ref 0.40–1.20)
Calcium: 10.1 mg/dL (ref 8.4–10.5)
Chloride: 106 mEq/L (ref 96–112)
GFR: 62.82 mL/min (ref >60.00)
Glucose, Bld: 124 mg/dL — ABNORMAL HIGH (ref 70–99)
Potassium: 4.8 mEq/L (ref 3.5–5.1)
Sodium: 140 mEq/L (ref 135–145)

## 2018-10-15 LAB — POC URINALSYSI DIPSTICK (AUTOMATED)
Bilirubin, UA: NEGATIVE
Blood, UA: NEGATIVE
Glucose, UA: NEGATIVE
KETONES UA: NEGATIVE
NITRITE UA: NEGATIVE
PH UA: 5 (ref 5.0–8.0)
PROTEIN UA: POSITIVE — AB
Spec Grav, UA: 1.025 (ref 1.010–1.025)
Urobilinogen, UA: 0.2 E.U./dL

## 2018-10-15 LAB — HEMOGLOBIN A1C: HEMOGLOBIN A1C: 6.6 % — AB (ref 4.6–6.5)

## 2018-10-15 LAB — TSH: TSH: 1.85 u[IU]/mL (ref 0.35–4.50)

## 2018-10-15 NOTE — Progress Notes (Signed)
   Subjective:    Patient ID: [name], female    DOB: May 28, 1946, 72 y.o.   MRN: [id number]  HPI Here for one week of stuffy head, PND, and a dry cough. No fever. She is fasting for lab work.    Review of Systems  HENT: Positive for congestion and postnasal drip. Negative for sinus pain and sore throat.   Eyes: Negative.   Respiratory: Positive for cough.        Objective:   Physical Exam  Constitutional: She appears well-developed and well-nourished.  HENT:  Right Ear: External ear normal.  Left Ear: External ear normal.  Nose: Nose normal.  Mouth/Throat: Oropharynx is clear and moist.  Eyes: Conjunctivae are normal.  Neck: No thyromegaly present.  Pulmonary/Chest: Effort normal and breath sounds normal. No stridor. No respiratory distress. She has no wheezes. She has no rales.  Lymphadenopathy:    She has no cervical adenopathy.          Assessment & Plan:  Viral URI. She may use Delsym and Mucinex prn.  Alysia Penna, MD

## 2018-10-22 ENCOUNTER — Telehealth: Payer: Self-pay | Admitting: *Deleted

## 2018-10-22 NOTE — Telephone Encounter (Signed)
Copied from [place] [id number]. Topic: General - Other >> Oct 21, 2018  2:26 PM Marin Olp L wrote: Reason for CRM: Patient would like lab results from 10/15/2018   Normal with goo glucose control per Dr. Sarajane Jews.  Called and spoke with pt and she is aware of results.

## 2018-11-26 HISTORY — PX: EYE SURGERY: SHX253

## 2018-12-08 DIAGNOSIS — H25813 Combined forms of age-related cataract, bilateral: Secondary | ICD-10-CM | POA: Diagnosis not present

## 2018-12-08 DIAGNOSIS — E119 Type 2 diabetes mellitus without complications: Secondary | ICD-10-CM | POA: Diagnosis not present

## 2018-12-08 DIAGNOSIS — H04123 Dry eye syndrome of bilateral lacrimal glands: Secondary | ICD-10-CM | POA: Diagnosis not present

## 2018-12-08 LAB — HM DIABETES EYE EXAM

## 2018-12-11 ENCOUNTER — Other Ambulatory Visit: Payer: Self-pay | Admitting: Family Medicine

## 2018-12-12 ENCOUNTER — Other Ambulatory Visit: Payer: Self-pay | Admitting: Family Medicine

## 2018-12-12 MED ORDER — GLIPIZIDE 10 MG PO TABS: 10.0000 mg | ORAL_TABLET | Freq: Two times a day (BID) | ORAL | 5 refills | Status: DC

## 2018-12-12 NOTE — Telephone Encounter (Signed)
Copied from [place] [id number]. Topic: Quick Communication - Rx Refill/Question >> Dec 12, 2018  1:50 PM Selinda Flavin B, NT wrote: Medication: glipiZIDE (GLUCOTROL) 10 MG tablet  Has the patient contacted their pharmacy? Yes.   (Agent: If no, request that the patient contact the pharmacy for the refill.) (Agent: If yes, when and what did the pharmacy advise?)  Preferred Pharmacy (with phone number or street name): [facility] [place], [place]  Agent: Please be advised that RX refills may take up to 3 business days. We ask that you follow-up with your pharmacy.

## 2018-12-22 DIAGNOSIS — H25811 Combined forms of age-related cataract, right eye: Secondary | ICD-10-CM | POA: Diagnosis not present

## 2018-12-22 DIAGNOSIS — H2511 Age-related nuclear cataract, right eye: Secondary | ICD-10-CM | POA: Diagnosis not present

## 2019-01-16 ENCOUNTER — Telehealth: Payer: Self-pay

## 2019-01-16 ENCOUNTER — Ambulatory Visit: Payer: Medicare Other

## 2019-01-16 NOTE — Telephone Encounter (Signed)
Author phoned pt. to reschedule awv. Appointment made for 2/28 at [facility].   Copied from [place] [id number]. Topic: Medicare AWV >> Jan 16, 2019  8:13 AM Virl Axe D wrote: Reason for CRM: Pt called and cancelled AWV. She would like to be contacted to reschedule. Please advise.

## 2019-01-22 NOTE — Progress Notes (Addendum)
Subjective:   [name] is a 73 y.o. female who presents for Medicare Annual (Subsequent) preventive examination.  Review of Systems:  No ROS.  Medicare Wellness Visit. Additional risk factors are reflected in the social history.  Cardiac Risk Factors include: obesity (BMI >30kg/m2);sedentary lifestyle;advanced age (>43men, >7 women);diabetes mellitus Sleep patterns: has interrupted sleep, but feels well rested overall.   Home Safety/Smoke Alarms: Feels safe in home. Smoke alarms in place.  Living environment; residence and Firearm Safety: 1-story house/ trailer, has two walkers at home if needed, but does not like to use.. Seat Belt Safety/Bike Helmet: Wears seat belt.   Female:   Pap- N/A d/t age      30- 07/2018, due 07/2019; pt. Aware.       Dexa scan- 11/2011, overdue. Pt. declined.       CCS- overdue. Pt. declines colonoscopy and cologuard. Education given.      Objective:     Vitals: BP (!) 150/70 (BP Location: Right Arm, Patient Position: Sitting, Cuff Size: Large)   Pulse (!) 50   Resp 16   Ht 5\' 5"  (1.651 m)   Wt 244 lb (110.7 kg)   SpO2 96%   BMI 40.60 kg/m   Body mass index is 40.6 kg/m.  Advanced Directives 01/23/2019 01/15/2018 02/14/2017 11/28/2016  Does Patient Have a Medical Advance Directive? No No No No  Would patient like information on creating a medical advance directive? Yes (MAU/Ambulatory/Procedural Areas - Information given) - No - Patient declined -    Tobacco Social History   Tobacco Use  Smoking Status Former Smoker  . Packs/day: 0.50  . Years: 48.00  . Pack years: 24.00  . Types: Cigarettes  . Start date: 11/26/1968  Smokeless Tobacco Never Used  Tobacco Comment   trying to quit;  not really motivated at present     Counseling given: Not Answered Comment: trying to quit;  not really motivated at present  Past Medical History:  Diagnosis Date  . Acute bronchitis 07/22/2008  . Diabetes mellitus without complication ([place])  0/11/7492  . 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 Hypertension    Past Surgical History:  Procedure Laterality Date  . EYE SURGERY Right 11/2018   implant  . 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 NECK SURGERY     Remove a tumor  . TYMPANOSTOMY     Dr. Thornell Mule   Family History  Problem Relation Age of Onset  . Heart disease Father        No details   Social History   Socioeconomic History  . Marital status: Widowed    Spouse name: Not on file  . Number of children: 0  . Years of education: Not on file  . Highest education level: Not on file  Occupational History  . 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 Financial resource strain: Not hard at all  . Food insecurity:    Worry: Never true    Inability: Never true  . Transportation needs:    Medical: No    Non-medical: No  Tobacco Use  . Smoking status: Former Smoker    Packs/day: 0.50    Years: 48.00    Pack years: 24.00    Types: Cigarettes    Start date: 11/26/1968  . Smokeless tobacco: Never Used  . Tobacco comment: trying to quit;  not really motivated at present  Substance and Sexual Activity  . Alcohol use: Yes    Alcohol/week: 0.0  standard drinks    Comment: rare  . Drug use: No  . Sexual activity: Not on file  Lifestyle  . Physical activity:    Days per week: 0 days    Minutes per session: 0 min  . Stress: Not at all  Relationships  . Social connections:    Talks on phone: Twice a week    Gets together: Once a week    Attends religious service: More than 4 times per year    Active member of club or organization: No    Attends meetings of clubs or organizations: Never    Relationship status: Widowed  Other Topics Concern  . Not on file  Social History Narrative   01/23/2019:    Lives with "great-grandson" in one level with new dog, Brandon.    Hx raising of her deceased sister's 3 children, and is now raising/supporting 10yo  'great-grandson'.    Attends church, church community as  support system. Many of her family has passed away.   Enjoys watching TV; used to enjoy going fishing          Outpatient Encounter Medications as of 01/23/2019  Medication Sig  . Alcohol Swabs (ALCOHOL PREP) PADS Use to test 1 X day and diagnosis code is E 11.9  . amLODipine (NORVASC) 10 MG tablet TAKE 1 TABLET BY MOUTH ONCE DAILY  . COLY-MYCIN S 3.01-26-09-0.5 MG/ML OTIC suspension PLACE 4 DROPS INTO BOTH EARS FOUR TIMES DAILY  . diclofenac (VOLTAREN) 75 MG EC tablet Take 1 tablet (75 mg total) by mouth 2 (two) times daily.  Marland Kitchen glipiZIDE (GLUCOTROL) 10 MG tablet Take 1 tablet (10 mg total) by mouth 2 (two) times daily before a meal.  . glucose blood (ONETOUCH VERIO) test strip Test 1 x day and diagnosis code is E 11.9  . Lancets (ONETOUCH ULTRASOFT) lancets Test 1 X day and diagnosis code is E 11.9  . lisinopril (PRINIVIL,ZESTRIL) 10 MG tablet Take 2 tablets (20 mg total) by mouth daily.  . metFORMIN (GLUCOPHAGE) 500 MG tablet TAKE 1 TABLET BY MOUTH TWICE DAILY WITH MEALS  . metoprolol succinate (TOPROL-XL) 50 MG 24 hr tablet Take 1 tablet (50 mg total) by mouth daily. Take with or immediately following a meal.  . pioglitazone (ACTOS) 30 MG tablet Take 1 tablet (30 mg total) by mouth daily.  . rosuvastatin (CRESTOR) 40 MG tablet TAKE 1 TABLET BY MOUTH ONCE DAILY **OVERDUE  FOR  OFFICE  VISIT  APPOINTMENT  TO  RECHECK  CHOLESTEROL**  . spironolactone (ALDACTONE) 25 MG tablet Take 1 tablet (25 mg total) by mouth daily.   No facility-administered encounter medications on file as of 01/23/2019.     Activities of Daily Living In your present state of health, do you have any difficulty performing the following activities: 01/23/2019 01/23/2019  Hearing? N Y  Vision? N Y  Difficulty concentrating or making decisions? N N  Walking or climbing stairs? N Y  Dressing or bathing? N N  Doing errands, shopping? N N  Preparing Food and eating ? N N  Using the Toilet? N N  In the past six months, have  you accidently leaked urine? N Y  Do you have problems with loss of bowel control? N N  Managing your Medications? N N  Managing your Finances? N N  Housekeeping or managing your Housekeeping? N N  Some recent data might be hidden    Patient Care Team: Laurey Morale, MD as PCP - [facility]    Assessment:   This is a routine wellness examination for [name]. Physical assessment deferred to PCP.   Exercise Activities and Dietary recommendations Current Exercise Habits: The patient does not participate in regular exercise at present, Exercise limited by: cardiac condition(s);orthopedic condition(s) Diet (meal preparation, eat out, water intake, caffeinated beverages, dairy products, fruits and vegetables): in general, a "healthy" diet  , eats a lot of meat; tries to be mindful of carbohydrates.  Pt. States she drinks about 8 glasses of flavored water daily.       Goals    . Exercise 150 minutes per week (moderate activity)     May consider going back to the y; riding the bike     . 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 Patient Stated     Return to [facility] doing low-impact exercise Go fishing with your great-grandson     . Weight (lb) < 200 lb (90.7 kg)     Cut back on portions and get eating under control          Emphasis on returning to what she used to enjoy doing with her husband and friends (going fishing) encouraged to be shared with great-grandson who she cares for.  Fall Risk Fall Risk  01/23/2019 01/15/2018 02/14/2017 11/28/2016 07/23/2016  Falls in the past year? 0 Yes No Yes No  Comment - - - doesn't know how; dog may have tripped her; did not get hurt  -  Number falls in past yr: - 1 - - -  Injury with Fall? - Yes - No -  Risk for fall due to : History of fall(s) - - - -  Follow up Falls prevention discussed Education provided - - -    Depression  Screen PHQ 2/9 Scores 01/23/2019 01/15/2018 02/14/2017 11/28/2016  PHQ - 2 Score 0 0 0 0  PHQ- 9 Score 1 - - -    Pt. 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  Cognitive Function       Ad8 score reviewed for issues:  Issues making decisions: no  Less interest in hobbies / activities: no  Repeats questions, stories (family complaining): no  Trouble using ordinary gadgets (microwave, computer, phone):no  Forgets the month or year: no  Mismanaging finances: no  Remembering appts: no  Daily problems with thinking and/or memory: yes Ad8 score is= 1    Immunization History  Administered Date(s) Administered  . Influenza, High Dose Seasonal PF 10/17/2015, 11/28/2016, 12/24/2017, 01/23/2019  . Influenza, Seasonal, Injecte, Preservative Fre 12/01/2012  . Influenza,inj,Quad PF,6+ Mos 09/07/2013, 10/11/2014  . Td 12/22/2007  . Tdap 07/16/2017    Screening Tests Health Maintenance  Topic Date Due  . Fecal DNA (Cologuard)  12/14/1996  . DEXA SCAN  12/15/2011  . PNA vac Low Risk Adult (1 of 2 - PCV13) 12/15/2011  . INFLUENZA VACCINE  06/26/2018  . FOOT EXAM  12/27/2018  . HEMOGLOBIN A1C  04/15/2019  . OPHTHALMOLOGY EXAM  12/09/2019  . MAMMOGRAM  08/19/2020  . TETANUS/TDAP  07/17/2027  . Hepatitis C Screening  Completed        Plan:     Bring a copy of your living will and/or healthcare power of attorney to your next office visit.  Continue doing brain stimulating activities (puzzles, reading, adult  coloring books, staying active) to keep memory sharp.   Refer to weight loss/diabetic portion control resources provided to you.  Consider getting pneumonia vaccine at next office visit. Will ask Dr. Sarajane Jews if he wants you to schedule a physical.  Let us know if you want to reconsider being evaluated for colon cancer. I have personally reviewed and noted the following in the patient's chart:   . Medical and social  history . Use of alcohol, tobacco or illicit drugs  . Current medications and supplements . Functional ability and status . Nutritional status . Physical activity . Advanced directives . List of other physicians . Vitals . Screenings to include cognitive, depression, and falls . Referrals and appointments  In addition, I have reviewed and discussed with patient certain preventive protocols, quality metrics, and best practice recommendations. A written personalized care plan for preventive services as well as general preventive health recommendations were provided to patient.     Alphia Moh, RN  01/23/2019   I have read this note and agree with its contents.  Alysia Penna, MD

## 2019-01-23 ENCOUNTER — Ambulatory Visit (INDEPENDENT_AMBULATORY_CARE_PROVIDER_SITE_OTHER): Payer: Medicare Other

## 2019-01-23 ENCOUNTER — Telehealth: Payer: Self-pay

## 2019-01-23 VITALS — BP 150/70 | HR 50 | Resp 16 | Ht 65.0 in | Wt 244.0 lb

## 2019-01-23 DIAGNOSIS — Z0000 Encounter for general adult medical examination without abnormal findings: Secondary | ICD-10-CM | POA: Diagnosis not present

## 2019-01-23 DIAGNOSIS — Z23 Encounter for immunization: Secondary | ICD-10-CM | POA: Diagnosis not present

## 2019-01-23 NOTE — Telephone Encounter (Signed)
I would have her schedule for a physical and we can check an A1c that day

## 2019-01-23 NOTE — Patient Instructions (Addendum)
Bring a copy of your living will and/or healthcare power of attorney to your next office visit.  Continue doing brain stimulating activities (puzzles, reading, adult coloring books, staying active) to keep memory sharp.   Refer to weight loss/diabetic portion control resources provided to you.  Consider getting pneumonia vaccine at next office visit. Will ask Dr. Sarajane Jews if he wants you to schedule a physical.  Let us know if you want to reconsider being evaluated for colon cancer.  [name] , Thank you for taking time to come for your Medicare Wellness Visit. I appreciate your ongoing commitment to your health goals. Please review the following plan we discussed and let me know if I can assist you in the future.   These are the goals we discussed: Goals    . Exercise 150 minutes per week (moderate activity)     May consider going back to the y; riding the bike     . Patient Stated     Go back to the gym; [name]  Will take exercise class, silver sneaker Ride the bike        . Patient Stated     Return to [facility] doing low-impact exercise Go fishing with your great-grandson     . Weight (lb) < 200 lb (90.7 kg)     Cut back on portions and get eating under control           This is a list of the screening recommended for you and due dates:  Health Maintenance  Topic Date Due  . Cologuard (Stool DNA test)  12/14/1996  . DEXA scan (bone density measurement)  12/15/2011  . Pneumonia vaccines (1 of 2 - PCV13) 12/15/2011  . Flu Shot  06/26/2018  . Complete foot exam   12/27/2018  . Hemoglobin A1C  04/15/2019  . Eye exam for diabetics  12/09/2019  . Mammogram  08/19/2020  . Tetanus Vaccine  07/17/2027  .  Hepatitis C: One time screening is recommended by Center for Disease Control  (CDC) for  adults born from [birth year] through 1964.   Completed     Colonoscopy, Adult A colonoscopy is an exam to look at the entire large intestine. During the exam, a lubricated, flexible tube  that has a camera on the end of it is inserted into the anus and then passed into the rectum, colon, and other parts of the large intestine. You may have a colonoscopy as a part of normal colorectal screening or if you have certain symptoms, such as:  Lack of red blood cells (anemia).  Diarrhea that does not go away.  Abdominal pain.  Blood in your stool (feces). A colonoscopy can help screen for and diagnose medical problems, including:  Tumors.  Polyps.  Inflammation.  Areas of bleeding. Tell a health care provider about:  Any allergies you have.  All medicines you are taking, including vitamins, herbs, eye drops, creams, and over-the-counter medicines.  Any problems you or family members have had with anesthetic medicines.  Any blood disorders you have.  Any surgeries you have had.  Any medical conditions you have.  Any problems you have had passing stool. What are the risks? Generally, this is a safe procedure. However, problems may occur, including:  Bleeding.  A tear in the intestine.  A reaction to medicines given during the exam.  Infection (rare). What happens before the procedure? Eating and drinking restrictions Follow instructions from your health care provider about eating and drinking, which may  include:  A few days before the procedure - follow a low-fiber diet. Avoid nuts, seeds, dried fruit, raw fruits, and vegetables.  1-3 days before the procedure - follow a clear liquid diet. Drink only clear liquids, such as clear broth or bouillon, black coffee or tea, clear juice, clear soft drinks or sports drinks, gelatin dessert, and popsicles. Avoid any liquids that contain red or purple dye.  On the day of the procedure - do not eat or drink anything starting 2 hours before the procedure, or within the time period that your health care provider recommends. Up to 2 hours before the procedure, you may continue to drink clear liquids, such as water or clear  fruit juice. Bowel prep If you were prescribed an oral bowel prep to clean out your colon:  Take it as told by your health care provider. Starting the day before your procedure, you will need to drink a large amount of medicated liquid. The liquid will cause you to have multiple loose stools until your stool is almost clear or light green.  If your skin or anus gets irritated from diarrhea, you may use these to relieve the irritation: ? Medicated wipes, such as adult wet wipes with aloe and vitamin E. ? A skin-soothing product like petroleum jelly.  If you vomit while drinking the bowel prep, take a break for up to 60 minutes and then begin the bowel prep again. If vomiting continues and you cannot take the bowel prep without vomiting, call your health care provider.  To clean out your colon, you may also be given: ? Laxative medicines. ? Instructions about how to use an enema. General instructions  Ask your health care provider about: ? Changing or stopping your regular medicines or supplements. This is especially important if you are taking iron supplements, diabetes medicines, or blood thinners. ? Taking medicines such as aspirin and ibuprofen. These medicines can thin your blood. Do not take these medicines before the procedure if your health care provider tells you not to.  Plan to have someone take you home from the hospital or clinic. What happens during the procedure?   An IV may be inserted into one of your veins.  You will be given medicine to help you relax (sedative).  To reduce your risk of infection: ? Your health care team will wash or sanitize their hands. ? Your anal area will be washed with soap.  You will be asked to lie on your side with your knees bent.  Your health care provider will lubricate a long, thin, flexible tube. The tube will have a camera and a light on the end.  The tube will be inserted into your anus.  The tube will be gently eased through  your rectum and colon.  Air will be delivered into your colon to keep it open. You may feel some pressure or cramping.  The camera will be used to take images during the procedure.  A small tissue sample may be removed to be examined under a microscope (biopsy).  If small polyps are found, your health care provider may remove them and have them checked for cancer cells.  When the exam is done, the tube will be removed. The procedure may vary among health care providers and hospitals. What happens after the procedure?  Your blood pressure, heart rate, breathing rate, and blood oxygen level will be monitored until the medicines you were given have worn off.  Do not drive for 24 hours after  the exam.  You may have a small amount of blood in your stool.  You may pass gas and have mild abdominal cramping or bloating due to the air that was used to inflate your colon during the exam.  It is up to you to get the results of your procedure. Ask your health care provider, or the department performing the procedure, when your results will be ready. Summary  A colonoscopy is an exam to look at the entire large intestine.  During a colonoscopy, a lubricated, flexible tube with a camera on the end of it is inserted into the anus and then passed into the colon and other parts of the large intestine.  Follow instructions from your health care provider about eating and drinking before the procedure.  If you were prescribed an oral bowel prep to clean out your colon, take it as told by your health care provider.  After your procedure, your blood pressure, heart rate, breathing rate, and blood oxygen level will be monitored until the medicines you were given have worn off. This information is not intended to replace advice given to you by your health care provider. Make sure you discuss any questions you have with your health care provider. Document Released: 11/09/2000 Document Revised: 09/04/2017  Document Reviewed: 01/24/2016 Elsevier Interactive Patient Education  2019 [place] for Type 2 Diabetes  A screening test for type 2 diabetes (type 2 diabetes mellitus) is a blood test to measure your blood sugar (glucose) level. This test is done to check for early signs of diabetes, before you develop symptoms.  Type 2 diabetes is a long-term (chronic) disease. In type 2 diabetes, one or both of these problems may be present:  The pancreas does not make enough of a hormone called insulin.  Cells in the body do not respond properly to insulin that the body makes (insulin resistance). Normally, insulin allows blood sugar (glucose) to enter cells in the body. The cells use glucose for energy. Insulin resistance or lack of insulin causes excess glucose to build up in the blood instead of going into cells. This results in high blood glucose levels (hyperglycemia), which can cause many complications. You may be screened for type 2 diabetes as part of your regular health care, especially if you have a high risk for diabetes. Screening can help to identify type 2 diabetes at its early stage (prediabetes). Identifying and treating prediabetes may delay or prevent the development of type 2 diabetes. What are the risk factors for type 2 diabetes? The following factors may make you more likely to develop type 2 diabetes:  Having a parent or sibling (first-degree relative) who has diabetes.  Being overweight or obese.  Being of American-Indian, African-American, Hispanic, Latino, Asian, or [place] descent.  Not getting enough exercise (having a sedentary lifestyle).  Being older than age 34.  Having a history of diabetes during pregnancy (gestational diabetes).  Having low levels of good cholesterol (HDL-C) or high levels of blood fats (triglycerides).  Having high blood glucose in a previous blood test.  Having high blood pressure.  Having certain diseases or  conditions that may be caused by insulin resistance, including: ? Acanthosis nigricans. This is a condition that causes dark skin on the neck, armpits, and groin. ? Polycystic ovary syndrome (PCOS). ? Cardiovascular heart disease. Who should be screened for type 2 diabetes? Adults  Adults age 7 and older. These adults should be screened once every three years.  Adults  who are younger than age 36, are overweight, and have one other risk factor. These adults should be screened once every three years.  Adults who have normal blood glucose levels and two or more risk factors. These adults may be screened once every year (annually).  Women who have had gestational diabetes in the past. These women should be screened once every three years.  Pregnant women who have risk factors. These women should be screened at their first prenatal visit and again between weeks 24 and 28 of pregnancy. Children and adolescents  Children and adolescents should be screened for type 2 diabetes if they are overweight and have any of the following risk factors: ? A family history of type 2 diabetes. ? Being a member of a high-risk ethnic group. ? Signs of insulin resistance or conditions that are associated with insulin resistance. ? A mother who had gestational diabetes while pregnant with him or her.  Screening should be done at least once every three years, starting at age 57 or at the onset of puberty, whichever comes first. Your health care provider or your child's health care provider may recommend having a screening more or less often. What happens during screening? During screening, your health care provider may ask questions about:  Your health and your risk factors, including your activity level and any medical conditions that you have.  The health of your first-degree relatives.  Past pregnancies, if this applies. Your health care provider will also do a physical exam, including a blood pressure  measurement and blood tests. There are four blood tests that can be used to screen for type 2 diabetes. You may have one or more of the following:  A fasting blood glucose (FBG) test. You will not be allowed to eat (you will fast) for 8 hours or more before a blood sample is taken.  A random blood glucose test. This test checks your blood glucose at any time of the day regardless of when you ate.  An oral glucose tolerance test (OGTT). This test measures your blood glucose at two times: ? After you have not eaten (have fasted) overnight. This is your baseline glucose level. ? Two hours after you drink a glucose-containing beverage.  An A1c (hemoglobin A1c) blood test. This test provides information about blood glucose control over the previous 2-3 months. What do the results mean? Your test results are a measurement of how much glucose is in your blood. Normal blood glucose levels mean that you do not have diabetes or prediabetes. High blood glucose levels may mean that you have prediabetes or diabetes. Depending on the results, other tests may be needed to confirm the diagnosis. You may be diagnosed with type 2 diabetes if:  Your FBG level is 126 mg/dL (7.0 mmol/L) or higher.  Your random blood glucose level is 200 mg/dL (11.1 mmol/L) or higher.  Your A1c level is 6.5% or higher.  Your OGTT result is higher than 200 mg/dL (11.1 mmol/L). These blood tests may be repeated to confirm your diagnosis. Talk with your health care provider about what your results mean. Summary  A screening test for type 2 diabetes (type 2 diabetes mellitus) is a blood test to measure your blood sugar (glucose) level.  Know what your risk factors are for developing type 2 diabetes.  If you are at risk, get screening tests as often as told by your health care provider.  Screening may help you identify type 2 diabetes at its early stage (prediabetes).  Identifying and treating prediabetes may delay or prevent the  development of type 2 diabetes. This information is not intended to replace advice given to you by your health care provider. Make sure you discuss any questions you have with your health care provider. Document Released: 09/08/2009 Document Revised: 07/02/2017 Document Reviewed: 12/15/2016 Elsevier Interactive Patient Education  2019 [place] Maintenance, Female Adopting a healthy lifestyle and getting preventive care can go a long way to promote health and wellness. Talk with your health care provider about what schedule of regular examinations is right for you. This is a good chance for you to check in with your provider about disease prevention and staying healthy. In between checkups, there are plenty of things you can do on your own. Experts have done a lot of research about which lifestyle changes and preventive measures are most likely to keep you healthy. Ask your health care provider for more information. Weight and diet Eat a healthy diet  Be sure to include plenty of vegetables, fruits, low-fat dairy products, and lean protein.  Do not eat a lot of foods high in solid fats, added sugars, or salt.  Get regular exercise. This is one of the most important things you can do for your health. ? Most adults should exercise for at least 150 minutes each week. The exercise should increase your heart rate and make you sweat (moderate-intensity exercise). ? Most adults should also do strengthening exercises at least twice a week. This is in addition to the moderate-intensity exercise. Maintain a healthy weight  Body mass index (BMI) is a measurement that can be used to identify possible weight problems. It estimates body fat based on height and weight. Your health care provider can help determine your BMI and help you achieve or maintain a healthy weight.  For females 97 years of age and older: ? A BMI below 18.5 is considered underweight. ? A BMI of 18.5 to 24.9 is  normal. ? A BMI of 25 to 29.9 is considered overweight. ? A BMI of 30 and above is considered obese. Watch levels of cholesterol and blood lipids  You should start having your blood tested for lipids and cholesterol at 72 years of age, then have this test every 5 years.  You may need to have your cholesterol levels checked more often if: ? Your lipid or cholesterol levels are high. ? You are older than 72 years of age. ? You are at high risk for heart disease. Cancer screening Lung Cancer  Lung cancer screening is recommended for adults 9-70 years old who are at high risk for lung cancer because of a history of smoking.  A yearly low-dose CT scan of the lungs is recommended for people who: ? Currently smoke. ? Have quit within the past 15 years. ? Have at least a 30-pack-year history of smoking. A pack year is smoking an average of one pack of cigarettes a day for 1 year.  Yearly screening should continue until it has been 15 years since you quit.  Yearly screening should stop if you develop a health problem that would prevent you from having lung cancer treatment. Breast Cancer  Practice breast self-awareness. This means understanding how your breasts normally appear and feel.  It also means doing regular breast self-exams. Let your health care provider know about any changes, no matter how small.  If you are in your 20s or 30s, you should have a clinical breast exam (CBE) by a health care  provider every 1-3 years as part of a regular health exam.  If you are 16 or older, have a CBE every year. Also consider having a breast X-ray (mammogram) every year.  If you have a family history of breast cancer, talk to your health care provider about genetic screening.  If you are at high risk for breast cancer, talk to your health care provider about having an MRI and a mammogram every year.  Breast cancer gene (BRCA) assessment is recommended for women who have family members with  BRCA-related cancers. BRCA-related cancers include: ? Breast. ? Ovarian. ? Tubal. ? Peritoneal cancers.  Results of the assessment will determine the need for genetic counseling and BRCA1 and BRCA2 testing. Cervical Cancer Your health care provider may recommend that you be screened regularly for cancer of the pelvic organs (ovaries, uterus, and vagina). This screening involves a pelvic examination, including checking for microscopic changes to the surface of your cervix (Pap test). You may be encouraged to have this screening done every 3 years, beginning at age 44.  For women ages 55-65, health care providers may recommend pelvic exams and Pap testing every 3 years, or they may recommend the Pap and pelvic exam, combined with testing for human papilloma virus (HPV), every 5 years. Some types of HPV increase your risk of cervical cancer. Testing for HPV may also be done on women of any age with unclear Pap test results.  Other health care providers may not recommend any screening for nonpregnant women who are considered low risk for pelvic cancer and who do not have symptoms. Ask your health care provider if a screening pelvic exam is right for you.  If you have had past treatment for cervical cancer or a condition that could lead to cancer, you need Pap tests and screening for cancer for at least 20 years after your treatment. If Pap tests have been discontinued, your risk factors (such as having a new sexual partner) need to be reassessed to determine if screening should resume. Some women have medical problems that increase the chance of getting cervical cancer. In these cases, your health care provider may recommend more frequent screening and Pap tests. Colorectal Cancer  This type of cancer can be detected and often prevented.  Routine colorectal cancer screening usually begins at 72 years of age and continues through 72 years of age.  Your health care provider may recommend screening at  an earlier age if you have risk factors for colon cancer.  Your health care provider may also recommend using home test kits to check for hidden blood in the stool.  A small camera at the end of a tube can be used to examine your colon directly (sigmoidoscopy or colonoscopy). This is done to check for the earliest forms of colorectal cancer.  Routine screening usually begins at age 84.  Direct examination of the colon should be repeated every 5-10 years through 71 years of age. However, you may need to be screened more often if early forms of precancerous polyps or small growths are found. Skin Cancer  Check your skin from head to toe regularly.  Tell your health care provider about any new moles or changes in moles, especially if there is a change in a mole's shape or color.  Also tell your health care provider if you have a mole that is larger than the size of a pencil eraser.  Always use sunscreen. Apply sunscreen liberally and repeatedly throughout the day.  Protect yourself  by wearing long sleeves, pants, a wide-brimmed hat, and sunglasses whenever you are outside. Heart disease, diabetes, and high blood pressure  High blood pressure causes heart disease and increases the risk of stroke. High blood pressure is more likely to develop in: ? People who have blood pressure in the high end of the normal range (130-139/85-89 mm Hg). ? People who are overweight or obese. ? People who are African American.  If you are 76-26 years of age, have your blood pressure checked every 3-5 years. If you are 41 years of age or older, have your blood pressure checked every year. You should have your blood pressure measured twice-once when you are at a hospital or clinic, and once when you are not at a hospital or clinic. Record the average of the two measurements. To check your blood pressure when you are not at a hospital or clinic, you can use: ? An automated blood pressure machine at a pharmacy. ? A  home blood pressure monitor.  If you are between 75 years and 60 years old, ask your health care provider if you should take aspirin to prevent strokes.  Have regular diabetes screenings. This involves taking a blood sample to check your fasting blood sugar level. ? If you are at a normal weight and have a low risk for diabetes, have this test once every three years after 72 years of age. ? If you are overweight and have a high risk for diabetes, consider being tested at a younger age or more often. Preventing infection Hepatitis B  If you have a higher risk for hepatitis B, you should be screened for this virus. You are considered at high risk for hepatitis B if: ? You were born in a country where hepatitis B is common. Ask your health care provider which countries are considered high risk. ? Your parents were born in a high-risk country, and you have not been immunized against hepatitis B (hepatitis B vaccine). ? You have HIV or AIDS. ? You use needles to inject street drugs. ? You live with someone who has hepatitis B. ? You have had sex with someone who has hepatitis B. ? You get hemodialysis treatment. ? You take certain medicines for conditions, including cancer, organ transplantation, and autoimmune conditions. Hepatitis C  Blood testing is recommended for: ? Everyone born from [birth year] through 1964. ? Anyone with known risk factors for hepatitis C. Sexually transmitted infections (STIs)  You should be screened for sexually transmitted infections (STIs) including gonorrhea and chlamydia if: ? You are sexually active and are younger than 72 years of age. ? You are older than 72 years of age and your health care provider tells you that you are at risk for this type of infection. ? Your sexual activity has changed since you were last screened and you are at an increased risk for chlamydia or gonorrhea. Ask your health care provider if you are at risk.  If you do not have HIV, but are  at risk, it may be recommended that you take a prescription medicine daily to prevent HIV infection. This is called pre-exposure prophylaxis (PrEP). You are considered at risk if: ? You are sexually active and do not regularly use condoms or know the HIV status of your partner(s). ? You take drugs by injection. ? You are sexually active with a partner who has HIV. Talk with your health care provider about whether you are at high risk of being infected with HIV. If you  choose to begin PrEP, you should first be tested for HIV. You should then be tested every 3 months for as long as you are taking PrEP. Pregnancy  If you are premenopausal and you may become pregnant, ask your health care provider about preconception counseling.  If you may become pregnant, take 400 to 800 micrograms (mcg) of folic acid every day.  If you want to prevent pregnancy, talk to your health care provider about birth control (contraception). Osteoporosis and menopause  Osteoporosis is a disease in which the bones lose minerals and strength with aging. This can result in serious bone fractures. Your risk for osteoporosis can be identified using a bone density scan.  If you are 59 years of age or older, or if you are at risk for osteoporosis and fractures, ask your health care provider if you should be screened.  Ask your health care provider whether you should take a calcium or vitamin D supplement to lower your risk for osteoporosis.  Menopause may have certain physical symptoms and risks.  Hormone replacement therapy may reduce some of these symptoms and risks. Talk to your health care provider about whether hormone replacement therapy is right for you. Follow these instructions at home:  Schedule regular health, dental, and eye exams.  Stay current with your immunizations.  Do not use any tobacco products including cigarettes, chewing tobacco, or electronic cigarettes.  If you are pregnant, do not drink  alcohol.  If you are breastfeeding, limit how much and how often you drink alcohol.  Limit alcohol intake to no more than 1 drink per day for nonpregnant women. One drink equals 12 ounces of beer, 5 ounces of wine, or 1 ounces of hard liquor.  Do not use street drugs.  Do not share needles.  Ask your health care provider for help if you need support or information about quitting drugs.  Tell your health care provider if you often feel depressed.  Tell your health care provider if you have ever been abused or do not feel safe at home. This information is not intended to replace advice given to you by your health care provider. Make sure you discuss any questions you have with your health care provider. Document Released: 05/28/2011 Document Revised: 04/19/2016 Document Reviewed: 08/16/2015 Elsevier Interactive Patient Education  2019 [employer].

## 2019-01-23 NOTE — Telephone Encounter (Signed)
During awv, author noticed pt. Is due for physical. Pt. stated "but I've already had my labs done", which were completed 10/15/18 during OV for viral URI. Pt. expressed concern over urinating more lately, no dysuria or new back pain reported. "You think it's because I've been eating several oranges at at time?". Author stated it was likely related to BG, but would follow up with PCP. Diet education provided. Routed to Dr. Sarajane Jews to advise on need for OV or physical.

## 2019-01-26 ENCOUNTER — Other Ambulatory Visit: Payer: Self-pay | Admitting: Family Medicine

## 2019-01-26 NOTE — Telephone Encounter (Signed)
Author phoned pt. to relay need to schedule physical with Dr. Sarajane Jews. Pt. stated she would "think about it" and call back to schedule. OK for PEC to schedule.

## 2019-02-23 ENCOUNTER — Other Ambulatory Visit: Payer: Self-pay | Admitting: Family Medicine

## 2019-03-03 ENCOUNTER — Other Ambulatory Visit: Payer: Self-pay | Admitting: Family Medicine

## 2019-04-06 ENCOUNTER — Other Ambulatory Visit: Payer: Self-pay | Admitting: Family Medicine

## 2019-04-11 ENCOUNTER — Other Ambulatory Visit: Payer: Self-pay | Admitting: Family Medicine

## 2019-05-12 ENCOUNTER — Other Ambulatory Visit: Payer: Self-pay | Admitting: Family Medicine

## 2019-05-19 DIAGNOSIS — E119 Type 2 diabetes mellitus without complications: Secondary | ICD-10-CM | POA: Diagnosis not present

## 2019-06-02 ENCOUNTER — Other Ambulatory Visit: Payer: Self-pay | Admitting: Family Medicine

## 2019-06-05 ENCOUNTER — Other Ambulatory Visit: Payer: Self-pay | Admitting: Family Medicine

## 2019-06-17 ENCOUNTER — Other Ambulatory Visit: Payer: Self-pay

## 2019-06-17 MED ORDER — ROSUVASTATIN CALCIUM 40 MG PO TABS: ORAL_TABLET | ORAL | 0 refills | Status: DC

## 2019-06-19 ENCOUNTER — Other Ambulatory Visit: Payer: Self-pay

## 2019-06-19 MED ORDER — ROSUVASTATIN CALCIUM 40 MG PO TABS: ORAL_TABLET | ORAL | 0 refills | Status: DC

## 2019-06-26 ENCOUNTER — Encounter (HOSPITAL_COMMUNITY): Payer: Self-pay

## 2019-06-26 ENCOUNTER — Other Ambulatory Visit: Payer: Self-pay

## 2019-06-26 ENCOUNTER — Emergency Department (HOSPITAL_COMMUNITY): Payer: Medicare Other

## 2019-06-26 ENCOUNTER — Inpatient Hospital Stay (HOSPITAL_COMMUNITY)
Admission: EM | Admit: 2019-06-26 | Discharge: 2019-06-29 | DRG: 066 | Disposition: A | Discharge status: Home Health | Payer: Medicare Other | Attending: Family Medicine | Admitting: Family Medicine

## 2019-06-26 DIAGNOSIS — Z87891 Personal history of nicotine dependence: Secondary | ICD-10-CM | POA: Diagnosis not present

## 2019-06-26 DIAGNOSIS — Z8673 Personal history of transient ischemic attack (TIA), and cerebral infarction without residual deficits: Secondary | ICD-10-CM

## 2019-06-26 DIAGNOSIS — I639 Cerebral infarction, unspecified: Secondary | ICD-10-CM

## 2019-06-26 DIAGNOSIS — Z20828 Contact with and (suspected) exposure to other viral communicable diseases: Secondary | ICD-10-CM | POA: Diagnosis present

## 2019-06-26 DIAGNOSIS — Z791 Long term (current) use of non-steroidal anti-inflammatories (NSAID): Secondary | ICD-10-CM | POA: Diagnosis not present

## 2019-06-26 DIAGNOSIS — R29810 Facial weakness: Secondary | ICD-10-CM | POA: Diagnosis present

## 2019-06-26 DIAGNOSIS — R29702 NIHSS score 2: Secondary | ICD-10-CM | POA: Diagnosis present

## 2019-06-26 DIAGNOSIS — E782 Mixed hyperlipidemia: Secondary | ICD-10-CM | POA: Diagnosis present

## 2019-06-26 DIAGNOSIS — I63531 Cerebral infarction due to unspecified occlusion or stenosis of right posterior cerebral artery: Secondary | ICD-10-CM | POA: Diagnosis not present

## 2019-06-26 DIAGNOSIS — R471 Dysarthria and anarthria: Secondary | ICD-10-CM | POA: Diagnosis not present

## 2019-06-26 DIAGNOSIS — E1151 Type 2 diabetes mellitus with diabetic peripheral angiopathy without gangrene: Secondary | ICD-10-CM | POA: Diagnosis present

## 2019-06-26 DIAGNOSIS — Z03818 Encounter for observation for suspected exposure to other biological agents ruled out: Secondary | ICD-10-CM | POA: Diagnosis not present

## 2019-06-26 DIAGNOSIS — I10 Essential (primary) hypertension: Secondary | ICD-10-CM | POA: Diagnosis present

## 2019-06-26 DIAGNOSIS — R4701 Aphasia: Secondary | ICD-10-CM | POA: Diagnosis present

## 2019-06-26 DIAGNOSIS — Z9119 Patient's noncompliance with other medical treatment and regimen: Secondary | ICD-10-CM | POA: Diagnosis not present

## 2019-06-26 DIAGNOSIS — E785 Hyperlipidemia, unspecified: Secondary | ICD-10-CM

## 2019-06-26 DIAGNOSIS — Z7984 Long term (current) use of oral hypoglycemic drugs: Secondary | ICD-10-CM

## 2019-06-26 DIAGNOSIS — E119 Type 2 diabetes mellitus without complications: Secondary | ICD-10-CM

## 2019-06-26 DIAGNOSIS — Z79899 Other long term (current) drug therapy: Secondary | ICD-10-CM

## 2019-06-26 DIAGNOSIS — R4781 Slurred speech: Secondary | ICD-10-CM | POA: Diagnosis not present

## 2019-06-26 DIAGNOSIS — R531 Weakness: Secondary | ICD-10-CM

## 2019-06-26 HISTORY — DX: Cerebral infarction, unspecified: I63.9

## 2019-06-26 LAB — DIFFERENTIAL
Abs Immature Granulocytes: 0.12 10*3/uL — ABNORMAL HIGH (ref 0.00–0.07)
Basophils Absolute: 0 10*3/uL (ref 0.0–0.1)
Basophils Relative: 1 %
Eosinophils Absolute: 0.4 10*3/uL (ref 0.0–0.5)
Eosinophils Relative: 4 %
Immature Granulocytes: 1 %
Lymphocytes Relative: 34 %
Lymphs Abs: 2.9 10*3/uL (ref 0.7–4.0)
Monocytes Absolute: 0.9 10*3/uL (ref 0.1–1.0)
Monocytes Relative: 11 %
Neutro Abs: 4.3 10*3/uL (ref 1.7–7.7)
Neutrophils Relative %: 49 %

## 2019-06-26 LAB — COMPREHENSIVE METABOLIC PANEL
ALT: 25 U/L (ref 0–44)
AST: 19 U/L (ref 15–41)
Albumin: 4 g/dL (ref 3.5–5.0)
Alkaline Phosphatase: 70 U/L (ref 38–126)
Anion gap: 12 (ref 5–15)
BUN: 31 mg/dL — ABNORMAL HIGH (ref 8–23)
CO2: 21 mmol/L — ABNORMAL LOW (ref 22–32)
Calcium: 10.2 mg/dL (ref 8.9–10.3)
Chloride: 106 mmol/L (ref 98–111)
Creatinine, Ser: 1.32 mg/dL — ABNORMAL HIGH (ref 0.44–1.00)
GFR calc Af Amer: 47 mL/min — ABNORMAL LOW (ref >60)
GFR calc non Af Amer: 40 mL/min — ABNORMAL LOW (ref >60)
Glucose, Bld: 172 mg/dL — ABNORMAL HIGH (ref 70–99)
Potassium: 4.4 mmol/L (ref 3.5–5.1)
Sodium: 139 mmol/L (ref 135–145)
Total Bilirubin: 0.4 mg/dL (ref 0.3–1.2)
Total Protein: 8 g/dL (ref 6.5–8.1)

## 2019-06-26 LAB — CBC
HCT: 40.8 % (ref 36.0–46.0)
Hemoglobin: 12.8 g/dL (ref 12.0–15.0)
MCH: 30.5 pg (ref 26.0–34.0)
MCHC: 31.4 g/dL (ref 30.0–36.0)
MCV: 97.1 fL (ref 80.0–100.0)
Platelets: 236 10*3/uL (ref 150–400)
RBC: 4.2 MIL/uL (ref 3.87–5.11)
RDW: 14.6 % (ref 11.5–15.5)
WBC: 8.7 10*3/uL (ref 4.0–10.5)
nRBC: 0.2 % (ref 0.0–0.2)

## 2019-06-26 LAB — LIPID PANEL
Cholesterol: 179 mg/dL (ref 0–200)
HDL: 62 mg/dL (ref >40)
LDL Cholesterol: 99 mg/dL (ref 0–99)
Total CHOL/HDL Ratio: 2.9 RATIO
Triglycerides: 88 mg/dL (ref <150)
VLDL: 18 mg/dL (ref 0–40)

## 2019-06-26 LAB — HEMOGLOBIN A1C
Hgb A1c MFr Bld: 7.7 % — ABNORMAL HIGH (ref 4.8–5.6)
Mean Plasma Glucose: 174.29 mg/dL

## 2019-06-26 LAB — APTT: aPTT: 30 seconds (ref 24–36)

## 2019-06-26 LAB — PROTIME-INR
INR: 1.1 (ref 0.8–1.2)
Prothrombin Time: 13.9 seconds (ref 11.4–15.2)

## 2019-06-26 LAB — CBG MONITORING, ED: Glucose-Capillary: 161 mg/dL — ABNORMAL HIGH (ref 70–99)

## 2019-06-26 MED ORDER — SODIUM CHLORIDE 0.9% FLUSH: 3.0000 mL | Freq: Once | INTRAVENOUS | Status: DC

## 2019-06-26 MED ORDER — LORAZEPAM 2 MG/ML IJ SOLN
1.0000 mg | Freq: Once | INTRAMUSCULAR | Status: AC
  Administered 2019-06-27: 1 mg via INTRAVENOUS
  Filled 2019-06-26: qty 1

## 2019-06-26 NOTE — ED Notes (Signed)
ED TO INPATIENT HANDOFF REPORT  ED Nurse Name and Phone #: Gaynell Eggleton [phone]  S Name/Age/Gender [name] 73 y.o. female Room/Bed: 021C/021C  Code Status   Code Status: Not on file  Home/SNF/Other Home Patient oriented to: self, place, time and situation Is this baseline? Yes   Triage Complete: Triage complete  Chief Complaint Stroke Sx  Triage Note Pt reports slurred speech since 4am this morning. States she had an episode of "itching?" prior to falling asleep but no slurred speech. No weakness or facial droop noted in triage, pt a.o.   Allergies Allergies  Allergen Reactions  . Hydromet [Hydrocodone-Homatropine]     Itching all over    Level of Care/Admitting Diagnosis ED Disposition    ED Disposition Condition [facility] Area: [place] [[id number]]  Level of Care: Telemetry Medical [104]  Covid Evaluation: Asymptomatic Screening Protocol (No Symptoms)  Diagnosis: Acute cerebrovascular accident (CVA) [facility]) [[id number]]  Admitting Physician: Elwyn Reach [[id number]]  Attending Physician: Elwyn Reach [[id number]]  Estimated length of stay: past midnight tomorrow  Certification:: I certify this patient will need inpatient services for at least 2 midnights  PT Class (Do Not Modify): Inpatient [101]  PT Acc Code (Do Not Modify): Private [1]       B Medical/Surgery History Past Medical History:  Diagnosis Date  . Acute bronchitis 07/22/2008  . Diabetes mellitus without complication ([place]) 6/94/8546  . 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 Hypertension    Past Surgical History:  Procedure Laterality Date  . EYE SURGERY Right 11/2018   implant  . 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 NECK SURGERY     Remove a tumor  . TYMPANOSTOMY     Dr. Christoper Allegra IV Location/Drains/Wounds Patient Lines/Drains/Airways Status   Active Line/Drains/Airways    Name:   Placement date:   Placement time:   Site:   Days:   Peripheral IV 06/26/19 Right Hand   06/26/19    2254    Hand   less than 1          Intake/Output Last 24 hours No intake or output data in the 24 hours ending 06/26/19 2304  Labs/Imaging Results for orders placed or performed during the hospital encounter of 06/26/19 (from the past 48 hour(s))  CBG monitoring, ED     Status: Abnormal   Collection Time: 06/26/19  8:08 PM  Result Value Ref Range   Glucose-Capillary 161 (H) 70 - 99 mg/dL  Protime-INR     Status: None   Collection Time: 06/26/19  8:32 PM  Result Value Ref Range   Prothrombin Time 13.9 11.4 - 15.2 seconds   INR 1.1 0.8 - 1.2    Comment: (NOTE) INR goal varies based on device and disease states. Performed at [facility], [place] [address], [place], [place] [address]   APTT     Status: None   Collection Time: 06/26/19  8:32 PM  Result Value Ref Range   aPTT 30 24 - 36 seconds    Comment: Performed at [facility] [address], [place] [address]  CBC     Status: None   Collection Time: 06/26/19  8:32 PM  Result Value Ref Range   WBC 8.7 4.0 - 10.5 K/uL   RBC 4.20 3.87 - 5.11 MIL/uL   Hemoglobin 12.8 12.0 - 15.0 g/dL   HCT 40.8 36.0 - 46.0 %  MCV 97.1 80.0 - 100.0 fL   MCH 30.5 26.0 - 34.0 pg   MCHC 31.4 30.0 - 36.0 g/dL   RDW 14.6 11.5 - 15.5 %   Platelets 236 150 - 400 K/uL   nRBC 0.2 0.0 - 0.2 %    Comment: Performed at [facility] [address] [place], [place] [address]  Differential     Status: Abnormal   Collection Time: 06/26/19  8:32 PM  Result Value Ref Range   Neutrophils Relative % 49 %   Neutro Abs 4.3 1.7 - 7.7 K/uL   Lymphocytes Relative 34 %   Lymphs Abs 2.9 0.7 - 4.0 K/uL   Monocytes Relative 11 %   Monocytes Absolute 0.9 0.1 - 1.0 K/uL   Eosinophils Relative 4 %   Eosinophils Absolute 0.4 0.0 - 0.5 K/uL   Basophils Relative 1 %   Basophils Absolute 0.0 0.0 - 0.1 K/uL   Immature Granulocytes 1 %   Abs Immature Granulocytes 0.12 (H) 0.00 - 0.07 K/uL     Comment: Performed at [facility] [address], [place], [place] [address]  Comprehensive metabolic panel     Status: Abnormal   Collection Time: 06/26/19  8:32 PM  Result Value Ref Range   Sodium 139 135 - 145 mmol/L   Potassium 4.4 3.5 - 5.1 mmol/L   Chloride 106 98 - 111 mmol/L   CO2 21 (L) 22 - 32 mmol/L   Glucose, Bld 172 (H) 70 - 99 mg/dL   BUN 31 (H) 8 - 23 mg/dL   Creatinine, Ser 1.32 (H) 0.44 - 1.00 mg/dL   Calcium 10.2 8.9 - 10.3 mg/dL   Total Protein 8.0 6.5 - 8.1 g/dL   Albumin 4.0 3.5 - 5.0 g/dL   AST 19 15 - 41 U/L   ALT 25 0 - 44 U/L   Alkaline Phosphatase 70 38 - 126 U/L   Total Bilirubin 0.4 0.3 - 1.2 mg/dL   GFR calc non Af Amer 40 (L) >60 mL/min   GFR calc Af Amer 47 (L) >60 mL/min   Anion gap 12 5 - 15    Comment: Performed at [facility] [address] [place],  [address]  Hemoglobin A1c     Status: Abnormal   Collection Time: 06/26/19  8:32 PM  Result Value Ref Range   Hgb A1c MFr Bld 7.7 (H) 4.8 - 5.6 %    Comment: (NOTE) Pre diabetes:          5.7%-6.4% Diabetes:              >6.4% Glycemic control for   <7.0% adults with diabetes    Mean Plasma Glucose 174.29 mg/dL    Comment: Performed at [facility] [address], [place], [place] [address]   Ct Head Wo Contrast  Result Date: 06/26/2019 CLINICAL DATA:  Slurred speech EXAM: CT HEAD WITHOUT CONTRAST TECHNIQUE: Contiguous axial images were obtained from the base of the skull through the vertex without intravenous contrast. COMPARISON:  None. FINDINGS: Brain: The ventricles and sulci are within normal limits for age. There is no intracranial mass, hemorrhage, extra-axial fluid collection, or midline shift. There is evidence of a prior appearing infarct in the medial right occipital lobe. There is evidence of a prior lacunar type infarct in the left thalamus as well as in the genu of the left internal capsule. There is small vessel disease in the centra semiovale  bilaterally with  patchy age uncertain white matter infarcts in these areas. There is small vessel disease in the anterior and posterior limbs of the right internal capsule. Vascular: No hyperdense vessel evident. There is calcification in each distal vertebral artery and in the carotid siphon regions. Skull: The bony calvarium appears intact. Sinuses/Orbits: There is mucosal thickening in several ethmoid air cells. Other visualized paranasal sinuses are clear. Orbits appear symmetric bilaterally except for previous cataract removal on the right. Other: There is extensive mastoid air cell opacification on the left. There is opacification of several inferior mastoid air cells on the right. IMPRESSION: 1. Prior infarct in the medial right occipital lobe. Prior small lacunar infarct in the left thalamus. Lacunar infarcts in each internal capsule region noted. 2. Small vessel disease in the centra semiovale bilaterally. There are several age uncertain lacunar type infarcts in the periventricular white matter. An acute infarct in the centra semiovale cannot be excluded. 3.  No evident mass or hemorrhage. 4. 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 Inferior mastoid air cell disease on the right. 5.  Foci of arterial vascular calcification noted. 6.  Mucosal thickening in several ethmoid air cells. Electronically Signed   By: Lowella Grip III M.D.   On: 06/26/2019 21:00    Pending Labs Unresulted Labs (From admission, onward)    Start     Ordered   06/26/19 2247  Lipid panel  ONCE - STAT,   STAT     06/26/19 2246   Signed and Held  Hemoglobin A1c  Tomorrow morning,   R     Signed and Held   Signed and Held  Lipid panel  Tomorrow morning,   R    Comments: Fasting    Signed and Held   Signed and Held  Hemoglobin A1c  Once,   R    Comments: To assess prior glycemic control    Signed and Held   Signed and Held  CBC  (enoxaparin (LOVENOX)    CrCl >/= 30 ml/min)  Once,   R    Comments: Baseline for  enoxaparin therapy IF NOT ALREADY DRAWN.  Notify MD if PLT < 100 K.    Signed and Held   Signed and Held  Creatinine, serum  (enoxaparin (LOVENOX)    CrCl >/= 30 ml/min)  Once,   R    Comments: Baseline for enoxaparin therapy IF NOT ALREADY DRAWN.    Signed and Held   Signed and Held  Creatinine, serum  (enoxaparin (LOVENOX)    CrCl >/= 30 ml/min)  Weekly,   R    Comments: while on enoxaparin therapy    Signed and Held   Signed and Held  CBC  Tomorrow morning,   R     Signed and Held   Signed and Held  Comprehensive metabolic panel  Tomorrow morning,   R     Signed and Held          Vitals/Pain Today's Vitals   06/26/19 2005 06/26/19 2012 06/26/19 2235 06/26/19 2246  BP: (!) 167/84   132/70  Pulse: 86  77 79  Resp:  18  18  Temp: 98.6 F (37 C)     TempSrc: Oral     SpO2: 98%  96% 96%  PainSc:  0-No pain      Isolation Precautions No active isolations  Medications Medications  sodium chloride flush (NS) 0.9 % injection 3 mL (3 mLs Intravenous Not Given 06/26/19 2235)    Mobility walks Moderate  fall risk   Focused Assessments         R Recommendations: See Admitting Provider Note  Report given to:   Additional Notes:

## 2019-06-26 NOTE — H&P (Signed)
History and Physical   [name] PJA:[id number] DOB: 11/27/1945 DOA: 06/26/2019  Referring MD/NP/PA: Dr. Jola Schmidt  PCP: Laurey Morale, MD   Outpatient Specialists: None  Patient coming from: Home  Chief Complaint: Speech changes  HPI: [name] is a 73 y.o. female with medical history significant of diabetes, hypertension, hyperlipidemia, who was brought in by daughter secondary to slurred speech.  Apparently she woke up early this morning and was not able to speak for several hours.  Speech is still not back to normal.  Daughter noted that she was not sounding like herself.  She try to walk but was feeling weak and could not walk normally.  She has not had this symptoms before.  She has longstanding hypertension with diabetes.  Patient has not been compliant according to daughter not checking have blood sugar regularly.  As the day goes to symptoms have gradually improved but not back to normal.  Patient was therefore brought to the emergency room.  Patient has had mild bilateral edema otherwise has been usually active and moving around.  In the ER head CT showed multiple lacunar infarcts and possibly subacute infarcts.  She is being admitted for CVA work-up..  ED Course: Temperature is 98.6 blood pressure 167/84 pulse 86 respiratory rate of 18 oxygen sat 96% room air.  Sodium is 139 potassium 4.4 CO2 21 glucose 172.  Creatinine is 1.32 with BUN of 31.  CBC entirely within normal.  PT/INR within normal.  Head CT showed prior infarct in the medial right occipital lobe.  Also lacunar infarct in the left thalamus internal capsule noted.  No acute CVA finding.  Patient is therefore being admitted for MRI and further testing.  Review of Systems: As per HPI otherwise 10 point review of systems negative.    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 Thornell Mule     reports that she has quit smoking. Her smoking use included cigarettes. She started smoking about 50 years ago. She has a 24.00 pack-year smoking history. She has never used smokeless tobacco. She reports current alcohol use. She reports that she does not use drugs.  Allergies  Allergen Reactions   Hydromet [Hydrocodone-Homatropine]     Itching all over    Family History  Problem Relation Age of Onset   Heart disease Father        No details     Prior to Admission medications   Medication Sig Start Date End Date Taking? Authorizing Provider  Alcohol Swabs (ALCOHOL PREP) PADS Use to test 1 X day and diagnosis code is E 11.9 02/20/17   Laurey Morale, MD  amLODipine (NORVASC) 10 MG tablet TAKE 1 TABLET BY MOUTH ONCE DAILY 07/03/18   Laurey Morale, MD  COLY-MYCIN S 3.01-26-09-0.5 MG/ML OTIC suspension PLACE 4 DROPS INTO BOTH EARS FOUR TIMES DAILY 07/03/18   Laurey Morale, MD  diclofenac (VOLTAREN) 75 MG EC tablet Take 1 tablet by mouth twice daily 05/18/19   Laurey Morale, MD  glipiZIDE (GLUCOTROL) 10 MG tablet TAKE 1 TABLET BY MOUTH TWICE  DAILY BEFORE  A  MEAL 05/18/19   Laurey Morale, MD  glucose blood [facility] VERIO) test strip Test 1 x day and diagnosis code is E 11.9 02/20/17   Laurey Morale, MD  Lancets [facility] ULTRASOFT) lancets Test 1 X day and diagnosis code is E 11.9 12/24/17   Laurey Morale, MD  lisinopril (PRINIVIL,ZESTRIL) 10 MG tablet Take 2 tablets (20 mg total) by mouth daily. 12/24/17   Laurey Morale, MD  metFORMIN (GLUCOPHAGE) 500 MG tablet TAKE 1 TABLET BY MOUTH TWICE DAILY WITH MEALS 05/18/19   Laurey Morale, MD  metoprolol succinate (TOPROL-XL) 50 MG 24 hr tablet Take 1 tablet (50 mg total) by mouth daily. Take with or immediately  following a meal. 12/24/17   Laurey Morale, MD  pioglitazone (ACTOS) 30 MG tablet Take 1 tablet by mouth once daily 04/21/19   Laurey Morale, MD  rosuvastatin (CRESTOR) 40 MG tablet Take one tablet by mouth daily. Patient must have an OV for further refills. 06/19/19   Laurey Morale, MD  spironolactone (ALDACTONE) 25 MG tablet Take 1 tablet by mouth once daily 05/18/19   Laurey Morale, MD    Physical Exam: Vitals:   06/26/19 2005 06/26/19 2012 06/26/19 2235 06/26/19 2246  BP: (!) 167/84   132/70  Pulse: 86  77 79  Resp:  18  18  Temp: 98.6 F (37 C)     TempSrc: Oral     SpO2: 98%  96% 96%      Constitutional: NAD, calm, comfortable Vitals:   06/26/19 2005 06/26/19 2012 06/26/19 2235 06/26/19 2246  BP: (!) 167/84   132/70  Pulse: 86  77 79  Resp:  18  18  Temp: 98.6 F (37 C)     TempSrc: Oral     SpO2: 98%  96% 96%   Eyes: PERRL, lids and conjunctivae normal ENMT: Mucous membranes are moist. Posterior pharynx clear of any exudate or lesions.Normal dentition.  Neck: normal, supple, no masses, no thyromegaly Respiratory: clear to auscultation bilaterally, no wheezing, no crackles. Normal respiratory effort. No accessory muscle use.  Cardiovascular: Regular rate and rhythm, no murmurs / rubs / gallops. 1+ extremity edema. 2+ pedal pulses. No carotid bruits.  Abdomen: no tenderness, no masses palpated. No hepatosplenomegaly. Bowel sounds positive.  Musculoskeletal: no clubbing / cyanosis. No joint deformity upper and lower extremities. Good ROM, no contractures. Normal muscle tone.  Skin: no rashes, lesions, ulcers. No induration Neurologic: CN 2-12 grossly intact. Sensation intact, DTR normal. Strength 5/5 in all 4.  Slurred speech Psychiatric: Normal judgment and insight. Alert and oriented x 3. Normal mood.     Labs on Admission: I have personally reviewed following labs and imaging studies  CBC: Recent Labs  Lab 06/26/19 2032  WBC 8.7  NEUTROABS 4.3  HGB 12.8    HCT 40.8  MCV 97.1  PLT 213   Basic Metabolic Panel: Recent Labs  Lab 06/26/19 2032  NA 139  K 4.4  CL 106  CO2 21*  GLUCOSE 172*  BUN 31*  CREATININE 1.32*  CALCIUM 10.2   GFR: CrCl cannot be calculated (Unknown ideal weight.). Liver Function Tests: Recent Labs  Lab 06/26/19 2032  AST 19  ALT 25  ALKPHOS 70  BILITOT 0.4  PROT 8.0  ALBUMIN 4.0   No results for input(s): LIPASE, AMYLASE in the last 168 hours. No results for input(s): AMMONIA in the last 168 hours. Coagulation Profile: Recent Labs  Lab  06/26/19 2032  INR 1.1   Cardiac Enzymes: No results for input(s): CKTOTAL, CKMB, CKMBINDEX, TROPONINI in the last 168 hours. BNP (last 3 results) No results for input(s): PROBNP in the last 8760 hours. HbA1C: No results for input(s): HGBA1C in the last 72 hours. CBG: Recent Labs  Lab 06/26/19 2008  GLUCAP 161*   Lipid Profile: No results for input(s): CHOL, HDL, LDLCALC, TRIG, CHOLHDL, LDLDIRECT in the last 72 hours. Thyroid Function Tests: No results for input(s): TSH, T4TOTAL, FREET4, T3FREE, THYROIDAB in the last 72 hours. Anemia Panel: No results for input(s): VITAMINB12, FOLATE, FERRITIN, TIBC, IRON, RETICCTPCT in the last 72 hours. Urine analysis:    Component Value Date/Time   COLORURINE YELLOW 07/05/2013 1206   APPEARANCEUR CLOUDY (A) 07/05/2013 1206   LABSPEC 1.014 07/05/2013 1206   PHURINE 6.0 07/05/2013 1206   GLUCOSEU NEGATIVE 07/05/2013 1206   HGBUR TRACE (A) 07/05/2013 1206   HGBUR negative 08/16/2009 1523   BILIRUBINUR neg 10/15/2018 0941   KETONESUR NEGATIVE 07/05/2013 1206   PROTEINUR Positive (A) 10/15/2018 0941   PROTEINUR NEGATIVE 07/05/2013 1206   UROBILINOGEN 0.2 10/15/2018 0941   UROBILINOGEN 0.2 07/05/2013 1206   NITRITE neg 10/15/2018 0941   NITRITE NEGATIVE 07/05/2013 1206   LEUKOCYTESUR Moderate (2+) (A) 10/15/2018 0941   Sepsis Labs: @LABRCNTIP (procalcitonin:4,lacticidven:4) )No results found for this or any  previous visit (from the past 240 hour(s)).   Radiological Exams on Admission: Ct Head Wo Contrast  Result Date: 06/26/2019 CLINICAL DATA:  Slurred speech EXAM: CT HEAD WITHOUT CONTRAST TECHNIQUE: Contiguous axial images were obtained from the base of the skull through the vertex without intravenous contrast. COMPARISON:  None. FINDINGS: Brain: The ventricles and sulci are within normal limits for age. There is no intracranial mass, hemorrhage, extra-axial fluid collection, or midline shift. There is evidence of a prior appearing infarct in the medial right occipital lobe. There is evidence of a prior lacunar type infarct in the left thalamus as well as in the genu of the left internal capsule. There is small vessel disease in the centra semiovale bilaterally with patchy age uncertain white matter infarcts in these areas. There is small vessel disease in the anterior and posterior limbs of the right internal capsule. Vascular: No hyperdense vessel evident. There is calcification in each distal vertebral artery and in the carotid siphon regions. Skull: The bony calvarium appears intact. Sinuses/Orbits: There is mucosal thickening in several ethmoid air cells. Other visualized paranasal sinuses are clear. Orbits appear symmetric bilaterally except for previous cataract removal on the right. Other: There is extensive mastoid air cell opacification on the left. There is opacification of several inferior mastoid air cells on the right. IMPRESSION: 1. Prior infarct in the medial right occipital lobe. Prior small lacunar infarct in the left thalamus. Lacunar infarcts in each internal capsule region noted. 2. Small vessel disease in the centra semiovale bilaterally. There are several age uncertain lacunar type infarcts in the periventricular white matter. An acute infarct in the centra semiovale cannot be excluded. 3.  No evident mass or hemorrhage. 4. 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 Inferior mastoid  air cell disease on the right. 5.  Foci of arterial vascular calcification noted. 6.  Mucosal thickening in several ethmoid air cells. Electronically Signed   By: Lowella Grip III M.D.   On: 06/26/2019 21:00    EKG: Independently reviewed.  It shows normal sinus rhythm with a rate of 84.  Low voltage EKG with nonspecific ST changes.  Assessment/Plan  Principal Problem:   Acute cerebrovascular accident (CVA) ([place]) Active Problems:   Diabetes mellitus without complication ([place])   HYPERLIPIDEMIA, MIXED   Essential hypertension     #1 TIA versus CVA: Head CT showed no acute findings but subacute findings.  Patient will be admitted.  MRI of the brain will be done.  Carotid Dopplers and echocardiogram.  PT OT and speech evaluation.  Aspirin and statin will be initiated.  Neurology consultation done.  #2 diabetes: Initiate sliding scale insulin with close monitoring.  #3 hypertension: Blood pressure control.  If stroke seen on MRI may do permissive hypertension.  #4 hyperlipidemia: Continue with statin.  #5 bilateral lower extremity edema: Probably stasis edema.  Nonpitting.  Echocardiogram to be done and will monitor.   DVT prophylaxis: Lovenox Code Status: Full code Family Communication: Daughter over the phone Disposition Plan: Home Consults called: Neurology Dr. Malen Gauze Admission status: Inpatient  Severity of Illness: The appropriate patient status for this patient is INPATIENT. Inpatient status is judged to be reasonable and necessary in order to provide the required intensity of service to ensure the patient's safety. The patient's presenting symptoms, physical exam findings, and initial radiographic and laboratory data in the context of their chronic comorbidities is felt to place them at high risk for further clinical deterioration. Furthermore, it is not anticipated that the patient will be medically stable for discharge from the hospital within 2 midnights of admission. The  following factors support the patient status of inpatient.   " The patient's presenting symptoms include slurred speech. " The worrisome physical exam findings include changes in patient's speech. " The initial radiographic and laboratory data are worrisome because of abnormal head CT. " The chronic co-morbidities include diabetes with hypertension.   * I certify that at the point of admission it is my clinical judgment that the patient will require inpatient hospital care spanning beyond 2 midnights from the point of admission due to high intensity of service, high risk for further deterioration and high frequency of surveillance required.Barbette Merino MD [facility] Pager [phone]  If 7PM-7AM, please contact night-coverage [email or web address] Password [facility]  06/26/2019, 10:59 PM

## 2019-06-26 NOTE — ED Provider Notes (Signed)
[facility] EMERGENCY DEPARTMENT Provider Note   CSN: [id number] Arrival date & time: 06/26/19  1959    History   Chief Complaint Chief Complaint  Patient presents with   Aphasia    HPI [name] is a 73 y.o. female.     HPI Patient presents with her daughter for evaluation of strokelike symptoms.  She reports she woke up this morning and had slurred speech for several hours.  Daughter confirms patient clearly did not sound like herself.  Also had some difficulty walking but is not sure how to describe this.  Denies any clear weakness or change in sensation.  Denies similar history of the symptoms.  Reports adherence to her diabetic medications including glipizide and metformin.  Does not check her blood glucose and is not sure what it was this morning.  Symptoms have improved this afternoon and evening.  Also states that she had itchiness this morning especially in her back and has had some worsened leg edema lately.   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 Thornell Mule     OB History   No obstetric history on file.      Home Medications    Prior to Admission medications   Medication Sig Start Date End Date Taking? Authorizing Provider  Alcohol Swabs (ALCOHOL PREP) PADS Use to test 1 X day and diagnosis code is E 11.9 02/20/17   Laurey Morale, MD  amLODipine  (NORVASC) 10 MG tablet TAKE 1 TABLET BY MOUTH ONCE DAILY 07/03/18   Laurey Morale, MD  COLY-MYCIN S 3.01-26-09-0.5 MG/ML OTIC suspension PLACE 4 DROPS INTO BOTH EARS FOUR TIMES DAILY 07/03/18   Laurey Morale, MD  diclofenac (VOLTAREN) 75 MG EC tablet Take 1 tablet by mouth twice daily 05/18/19   Laurey Morale, MD  glipiZIDE (GLUCOTROL) 10 MG tablet TAKE 1 TABLET BY MOUTH TWICE DAILY BEFORE  A  MEAL 05/18/19   Laurey Morale, MD  glucose blood (ONETOUCH VERIO) test strip Test 1 x day and diagnosis code is E 11.9 02/20/17   Laurey Morale, MD  Lancets [facility] ULTRASOFT) lancets Test 1 X day and diagnosis code is E 11.9 12/24/17   Laurey Morale, MD  lisinopril (PRINIVIL,ZESTRIL) 10 MG tablet Take 2 tablets (20 mg total) by mouth daily. 12/24/17   Laurey Morale, MD  metFORMIN (GLUCOPHAGE) 500 MG tablet TAKE 1 TABLET BY MOUTH TWICE DAILY WITH MEALS 05/18/19   Laurey Morale, MD  metoprolol succinate (TOPROL-XL) 50 MG 24 hr tablet Take 1 tablet (50 mg total) by mouth daily. Take with or immediately following a meal. 12/24/17   Laurey Morale, MD  pioglitazone (ACTOS) 30 MG tablet Take 1 tablet by mouth once daily 04/21/19   Laurey Morale, MD  rosuvastatin (CRESTOR) 40 MG tablet Take one tablet by  mouth daily. Patient must have an OV for further refills. 06/19/19   Laurey Morale, MD  spironolactone (ALDACTONE) 25 MG tablet Take 1 tablet by mouth once daily 05/18/19   Laurey Morale, MD    Family History Family History  Problem Relation Age of Onset   Heart disease Father        No details    Social History Social History   Tobacco Use   Smoking status: Former Smoker    Packs/day: 0.50    Years: 48.00    Pack years: 24.00    Types: Cigarettes    Start date: 11/26/1968   Smokeless tobacco: Never Used   Tobacco comment: trying to quit;  not really motivated at present  Substance Use Topics   Alcohol use: Yes    Alcohol/week: 0.0 standard drinks    Comment: rare   Drug use: No      Allergies   Hydromet [hydrocodone-homatropine]   Review of Systems Review of Systems  Constitutional: Negative for chills and fever.  HENT: Negative for ear pain and sore throat.   Eyes: Negative for pain and visual disturbance.  Respiratory: Negative for cough and shortness of breath.   Cardiovascular: Negative for chest pain.  Gastrointestinal: Negative for abdominal pain and vomiting.  Endocrine: Positive for polyuria.  Genitourinary: Negative for dysuria and hematuria.  Musculoskeletal: Negative for arthralgias and back pain.  Skin: Negative for color change and rash.       Itchiness of the skin  Neurological: Negative for seizures and syncope.       Dysarthria  All other systems reviewed and are negative.    Physical Exam Updated Vital Signs BP (!) 167/84 (BP Location: Left Arm)    Pulse 77    Temp 98.6 F (37 C) (Oral)    Resp 18    SpO2 96%   Physical Exam Vitals signs and nursing note reviewed.  Constitutional:      General: She is not in acute distress.    Appearance: She is well-developed. She is obese.  HENT:     Head: Normocephalic and atraumatic.     Mouth/Throat:     Mouth: Mucous membranes are moist.  Eyes:     Extraocular Movements: Extraocular movements intact.     Conjunctiva/sclera: Conjunctivae normal.  Neck:     Musculoskeletal: Neck supple.  Cardiovascular:     Rate and Rhythm: Normal rate and regular rhythm.     Heart sounds: Murmur present.  Pulmonary:     Effort: Pulmonary effort is normal. No respiratory distress.     Breath sounds: Normal breath sounds.  Abdominal:     Palpations: Abdomen is soft.     Tenderness: There is no abdominal tenderness.  Musculoskeletal:     Right lower leg: Edema present.     Left lower leg: Edema present.  Skin:    General: Skin is warm and dry.     Findings: Rash present.     Comments: Scattered mildly erythematous and raised macular rash in the middle of her back with excoriations   Neurological:     Mental Status: She is alert.     Comments: No dysarthria, aphasia, or difficulty naming.  No dysmetria.  Normal heel-to-shin and Romberg.  Ambulates easily.  Full strength and sensation equal bilaterally throughout.  Visual fields intact.  No pronator drift.      ED Treatments / Results  Labs (all labs ordered are listed, but only abnormal results are displayed) Labs  Reviewed  DIFFERENTIAL - Abnormal; Notable for the following components:      Result Value   Abs Immature Granulocytes 0.12 (*)    All other components within normal limits  COMPREHENSIVE METABOLIC PANEL - Abnormal; Notable for the following components:   CO2 21 (*)    Glucose, Bld 172 (*)    BUN 31 (*)    Creatinine, Ser 1.32 (*)    GFR calc non Af Amer 40 (*)    GFR calc Af Amer 47 (*)    All other components within normal limits  CBG MONITORING, ED - Abnormal; Notable for the following components:   Glucose-Capillary 161 (*)    All other components within normal limits  PROTIME-INR  APTT  CBC  LIPID PANEL  HEMOGLOBIN A1C  CBG MONITORING, ED    EKG None  Radiology Ct Head Wo Contrast  Result Date: 06/26/2019 CLINICAL DATA:  Slurred speech EXAM: CT HEAD WITHOUT CONTRAST TECHNIQUE: Contiguous axial images were obtained from the base of the skull through the vertex without intravenous contrast. COMPARISON:  None. FINDINGS: Brain: The ventricles and sulci are within normal limits for age. There is no intracranial mass, hemorrhage, extra-axial fluid collection, or midline shift. There is evidence of a prior appearing infarct in the medial right occipital lobe. There is evidence of a prior lacunar type infarct in the left thalamus as well as in the genu of the left internal capsule. There is small vessel disease in the centra semiovale bilaterally with patchy age uncertain white matter infarcts in these areas. There is small vessel disease in the anterior and posterior limbs of the right internal  capsule. Vascular: No hyperdense vessel evident. There is calcification in each distal vertebral artery and in the carotid siphon regions. Skull: The bony calvarium appears intact. Sinuses/Orbits: There is mucosal thickening in several ethmoid air cells. Other visualized paranasal sinuses are clear. Orbits appear symmetric bilaterally except for previous cataract removal on the right. Other: There is extensive mastoid air cell opacification on the left. There is opacification of several inferior mastoid air cells on the right. IMPRESSION: 1. Prior infarct in the medial right occipital lobe. Prior small lacunar infarct in the left thalamus. Lacunar infarcts in each internal capsule region noted. 2. Small vessel disease in the centra semiovale bilaterally. There are several age uncertain lacunar type infarcts in the periventricular white matter. An acute infarct in the centra semiovale cannot be excluded. 3.  No evident mass or hemorrhage. 4. 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 Inferior mastoid air cell disease on the right. 5.  Foci of arterial vascular calcification noted. 6.  Mucosal thickening in several ethmoid air cells. Electronically Signed   By: Lowella Grip III M.D.   On: 06/26/2019 21:00    Procedures Procedures (including critical care time)  Medications Ordered in ED Medications  sodium chloride flush (NS) 0.9 % injection 3 mL (3 mLs Intravenous Not Given 06/26/19 2235)     Initial Impression / Assessment and Plan / ED Course  I have reviewed the triage vital signs and the nursing notes.  Pertinent labs & imaging results that were available during my care of the patient were reviewed by me and considered in my medical decision making (see chart for details).        Ms. Strahan is a 73 year old female with a history of non-insulin-dependent diabetes, obesity, essential hypertension, hyperlipidemia, and tobacco use.  I have reviewed her medical record.  Chief complaint  today is concern  for strokelike symptoms.  Overall her history is concerning for strokelike symptoms.  Ultimately work-up was notable for head CT which showed multiple prior infarcts with several age uncertain lacunar type infarcts in the periventricular white matter.  Possible acute infarct in the centrum semi-ovale cannot be excluded.  Also has extensive mastoid air cell disease.  Labs are notable for no significant leukocytosis or anemia.  Electrolytes within normal limits.  Her creatinine is mildly elevated but would not meet criteria for AKI.  Glucose 172.  Normal coags.  She has normal exam now but in the setting of CT findings and her extensive risk factors for additional stroke, I recommended admission for further testing to guide risk stratification and optimization of preventative treatment.  She was in agreement.  Discussed with neurology who recommended MRI.  Discussed with hospitalist service who will admit for additional work-up, monitoring, and treatment.  This patient was seen in conjunction with Dr. Venora Maples.    Final Clinical Impressions(s) / ED Diagnoses   Final diagnoses:  Dysarthria  Ischemic stroke ([place])  Essential hypertension  Hyperlipidemia, unspecified hyperlipidemia type  Non-insulin dependent type 2 diabetes mellitus [facility])    ED Discharge Orders    None       Tillie Fantasia, MD 06/26/19 2252    Jola Schmidt, MD 06/27/19 2249

## 2019-06-26 NOTE — ED Triage Notes (Signed)
Pt reports slurred speech since 4am this morning. States she had an episode of "itching?" prior to falling asleep but no slurred speech. No weakness or facial droop noted in triage, pt a.o.

## 2019-06-27 ENCOUNTER — Inpatient Hospital Stay (HOSPITAL_COMMUNITY): Payer: Medicare Other

## 2019-06-27 ENCOUNTER — Encounter (HOSPITAL_COMMUNITY): Payer: Self-pay

## 2019-06-27 DIAGNOSIS — I63512 Cerebral infarction due to unspecified occlusion or stenosis of left middle cerebral artery: Secondary | ICD-10-CM | POA: Diagnosis not present

## 2019-06-27 DIAGNOSIS — I10 Essential (primary) hypertension: Secondary | ICD-10-CM | POA: Diagnosis not present

## 2019-06-27 DIAGNOSIS — I639 Cerebral infarction, unspecified: Secondary | ICD-10-CM | POA: Diagnosis not present

## 2019-06-27 DIAGNOSIS — I6523 Occlusion and stenosis of bilateral carotid arteries: Secondary | ICD-10-CM | POA: Diagnosis not present

## 2019-06-27 LAB — LIPID PANEL
Cholesterol: 160 mg/dL (ref 0–200)
HDL: 56 mg/dL (ref >40)
LDL Cholesterol: 89 mg/dL (ref 0–99)
Total CHOL/HDL Ratio: 2.9 RATIO
Triglycerides: 77 mg/dL (ref <150)
VLDL: 15 mg/dL (ref 0–40)

## 2019-06-27 LAB — COMPREHENSIVE METABOLIC PANEL
ALT: 22 U/L (ref 0–44)
AST: 17 U/L (ref 15–41)
Albumin: 3.5 g/dL (ref 3.5–5.0)
Alkaline Phosphatase: 61 U/L (ref 38–126)
Anion gap: 12 (ref 5–15)
BUN: 28 mg/dL — ABNORMAL HIGH (ref 8–23)
CO2: 20 mmol/L — ABNORMAL LOW (ref 22–32)
Calcium: 9.7 mg/dL (ref 8.9–10.3)
Chloride: 108 mmol/L (ref 98–111)
Creatinine, Ser: 1.11 mg/dL — ABNORMAL HIGH (ref 0.44–1.00)
GFR calc Af Amer: 57 mL/min — ABNORMAL LOW (ref >60)
GFR calc non Af Amer: 50 mL/min — ABNORMAL LOW (ref >60)
Glucose, Bld: 170 mg/dL — ABNORMAL HIGH (ref 70–99)
Potassium: 4.1 mmol/L (ref 3.5–5.1)
Sodium: 140 mmol/L (ref 135–145)
Total Bilirubin: 0.2 mg/dL — ABNORMAL LOW (ref 0.3–1.2)
Total Protein: 7 g/dL (ref 6.5–8.1)

## 2019-06-27 LAB — GLUCOSE, CAPILLARY
Glucose-Capillary: 127 mg/dL — ABNORMAL HIGH (ref 70–99)
Glucose-Capillary: 146 mg/dL — ABNORMAL HIGH (ref 70–99)
Glucose-Capillary: 156 mg/dL — ABNORMAL HIGH (ref 70–99)
Glucose-Capillary: 164 mg/dL — ABNORMAL HIGH (ref 70–99)
Glucose-Capillary: 214 mg/dL — ABNORMAL HIGH (ref 70–99)

## 2019-06-27 LAB — CBC
HCT: 38.2 % (ref 36.0–46.0)
Hemoglobin: 12.2 g/dL (ref 12.0–15.0)
MCH: 31.3 pg (ref 26.0–34.0)
MCHC: 31.9 g/dL (ref 30.0–36.0)
MCV: 97.9 fL (ref 80.0–100.0)
Platelets: 209 10*3/uL (ref 150–400)
RBC: 3.9 MIL/uL (ref 3.87–5.11)
RDW: 14.6 % (ref 11.5–15.5)
WBC: 8 10*3/uL (ref 4.0–10.5)
nRBC: 0 % (ref 0.0–0.2)

## 2019-06-27 LAB — HEMOGLOBIN A1C
Hgb A1c MFr Bld: 7.6 % — ABNORMAL HIGH (ref 4.8–5.6)
Mean Plasma Glucose: 171.42 mg/dL

## 2019-06-27 LAB — ECHOCARDIOGRAM COMPLETE
Height: 65 in
Weight: 4038.83 oz

## 2019-06-27 LAB — SARS CORONAVIRUS 2 (TAT 6-24 HRS): SARS Coronavirus 2: NEGATIVE

## 2019-06-27 MED ORDER — ASPIRIN 81 MG PO CHEW
81.0000 mg | CHEWABLE_TABLET | Freq: Every day | ORAL | Status: DC
  Administered 2019-06-27 – 2019-06-29 (×3): 81 mg via ORAL
  Filled 2019-06-27 (×3): qty 1

## 2019-06-27 MED ORDER — GLIPIZIDE 5 MG PO TABS
5.0000 mg | ORAL_TABLET | Freq: Every day | ORAL | Status: DC
  Administered 2019-06-27 – 2019-06-29 (×3): 5 mg via ORAL
  Filled 2019-06-27 (×3): qty 1

## 2019-06-27 MED ORDER — ROSUVASTATIN CALCIUM 20 MG PO TABS
40.0000 mg | ORAL_TABLET | Freq: Every day | ORAL | Status: DC
  Administered 2019-06-27 – 2019-06-29 (×3): 40 mg via ORAL
  Filled 2019-06-27 (×3): qty 2

## 2019-06-27 MED ORDER — ENOXAPARIN SODIUM 40 MG/0.4ML ~~LOC~~ SOLN
40.0000 mg | Freq: Every day | SUBCUTANEOUS | Status: DC
  Administered 2019-06-27 – 2019-06-28 (×2): 40 mg via SUBCUTANEOUS
  Filled 2019-06-27 (×3): qty 0.4

## 2019-06-27 MED ORDER — ACETAMINOPHEN 650 MG RE SUPP: 650.0000 mg | RECTAL | Status: DC | PRN

## 2019-06-27 MED ORDER — INSULIN ASPART 100 UNIT/ML ~~LOC~~ SOLN
0.0000 [IU] | Freq: Every day | SUBCUTANEOUS | Status: DC
  Administered 2019-06-27 – 2019-06-28 (×2): 2 [IU] via SUBCUTANEOUS

## 2019-06-27 MED ORDER — ACETAMINOPHEN 325 MG PO TABS: 650.0000 mg | ORAL_TABLET | ORAL | Status: DC | PRN

## 2019-06-27 MED ORDER — METOPROLOL SUCCINATE ER 25 MG PO TB24
50.0000 mg | ORAL_TABLET | Freq: Every day | ORAL | Status: DC
  Administered 2019-06-27 – 2019-06-29 (×3): 50 mg via ORAL
  Filled 2019-06-27 (×3): qty 2

## 2019-06-27 MED ORDER — INSULIN ASPART 100 UNIT/ML ~~LOC~~ SOLN
0.0000 [IU] | Freq: Three times a day (TID) | SUBCUTANEOUS | Status: DC
  Administered 2019-06-27: 2 [IU] via SUBCUTANEOUS
  Administered 2019-06-27 – 2019-06-28 (×3): 1 [IU] via SUBCUTANEOUS
  Administered 2019-06-28 – 2019-06-29 (×3): 2 [IU] via SUBCUTANEOUS

## 2019-06-27 MED ORDER — GADOBUTROL 1 MMOL/ML IV SOLN
9.0000 mL | Freq: Once | INTRAVENOUS | Status: AC | PRN
  Administered 2019-06-27: 9 mL via INTRAVENOUS

## 2019-06-27 MED ORDER — SODIUM CHLORIDE 0.9 % IV SOLN
INTRAVENOUS | Status: DC
  Administered 2019-06-27 – 2019-06-28 (×3): via INTRAVENOUS

## 2019-06-27 MED ORDER — AMLODIPINE BESYLATE 10 MG PO TABS
10.0000 mg | ORAL_TABLET | Freq: Every day | ORAL | Status: DC
  Administered 2019-06-27 – 2019-06-29 (×3): 10 mg via ORAL
  Filled 2019-06-27 (×3): qty 1

## 2019-06-27 MED ORDER — ROSUVASTATIN CALCIUM 20 MG PO TABS: 20.0000 mg | ORAL_TABLET | Freq: Every day | ORAL | Status: DC

## 2019-06-27 MED ORDER — STROKE: EARLY STAGES OF RECOVERY BOOK
Freq: Once | Status: AC
  Administered 2019-06-27: 01:00:00
  Filled 2019-06-27: qty 1

## 2019-06-27 MED ORDER — SENNOSIDES-DOCUSATE SODIUM 8.6-50 MG PO TABS: 1.0000 | ORAL_TABLET | Freq: Every evening | ORAL | Status: DC | PRN

## 2019-06-27 MED ORDER — ACETAMINOPHEN 160 MG/5ML PO SOLN: 650.0000 mg | ORAL | Status: DC | PRN

## 2019-06-27 MED ORDER — DICLOFENAC SODIUM 75 MG PO TBEC
75.0000 mg | DELAYED_RELEASE_TABLET | Freq: Two times a day (BID) | ORAL | Status: DC
  Administered 2019-06-27 – 2019-06-29 (×4): 75 mg via ORAL
  Filled 2019-06-27 (×5): qty 1

## 2019-06-27 MED ORDER — CLOPIDOGREL BISULFATE 75 MG PO TABS
75.0000 mg | ORAL_TABLET | Freq: Every day | ORAL | Status: DC
  Administered 2019-06-27 – 2019-06-29 (×3): 75 mg via ORAL
  Filled 2019-06-27 (×3): qty 1

## 2019-06-27 MED ORDER — LISINOPRIL 20 MG PO TABS
20.0000 mg | ORAL_TABLET | Freq: Every day | ORAL | Status: DC
  Administered 2019-06-27 – 2019-06-29 (×3): 20 mg via ORAL
  Filled 2019-06-27 (×3): qty 1

## 2019-06-27 NOTE — Progress Notes (Signed)
  Echocardiogram 2D Echocardiogram has been performed.  [name] 06/27/2019, 2:08 PM

## 2019-06-27 NOTE — Progress Notes (Signed)
PROGRESS NOTE    Patient: [name]                            PCP: Laurey Morale, MD                    DOB: 11-Apr-1946            DOA: 06/26/2019 [id number]             DOS: 06/27/2019, 11:19 AM   LOS: 1 day   Date of Service: The patient was seen and examined on 06/27/2019  Subjective:   The patient was seen and examined this morning, stable no acute distress.  No focal neurological findings.  Per patient still having abnormal speech.  No obvious slurring of her speech, thought process intact  Brief Narrative:   [name] is a 73 y.o. female with medical history significant of diabetes, hypertension, hyperlipidemia, who was brought in by daughter secondary to slurred speech.  Apparently she woke up early this morning and was not able to speak for several hours.  Speech is still not back to normal.  Daughter noted that she was not sounding like herself.  She try to walk but was feeling weak and could not walk normally.  She has not had this symptoms before.  She has longstanding hypertension with diabetes.  Patient has not been compliant according to daughter not checking have blood sugar regularly.  As the day goes to symptoms have gradually improved but not back to normal.  Patient was therefore brought to the emergency room.  Patient has had mild bilateral edema otherwise has been usually active and moving around.  In the ER head CT showed multiple lacunar infarcts and possibly subacute infarcts.  She is being admitted for CVA work-up..  ED Course: Temperature is 98.6 blood pressure 167/84 pulse 86 respiratory rate of 18 oxygen sat 96% room air.  Sodium is 139 potassium 4.4 CO2 21 glucose 172.  Creatinine is 1.32 with BUN of 31.  CBC entirely within normal.  PT/INR within normal.  Head CT showed prior infarct in the medial right occipital lobe.  Also lacunar infarct in the left thalamus internal capsule noted.  No acute CVA finding.  Patient is therefore being admitted for MRI  and further testing.   Assessment & Plan:   Principal Problem:   Acute cerebrovascular accident (CVA) ([place]) Active Problems:   Diabetes mellitus without complication ([place])   HYPERLIPIDEMIA, MIXED   Essential hypertension   TIA versus CVA:  -Stable this morning negative any new focal neurological finding per patient speech still not normal Head CT showed no acute findings but subacute findings. MRA/MRI head: Was reviewed negative any acute stroke -CT of the head revealing prior stroke; medial right occipital lobe. Prior small lacunar infarct in the left thalamus. Lacunar infarcts  MRA of the neck: Pending Echo:    Pending- PT OT and speech evaluation.  Neurology consulted by the night physician pending evaluation   diabetes: Initiate sliding scale insulin with close monitoring.  hypertension: Blood pressure control.  If stroke seen on MRI may do permissive hypertension.  hyperlipidemia:  Continue with statin. Total cholesterol 160, HDL 56, LDL 89, triglycerides 77  Bilateral lower extremity edema: Probably stasis edema.   Nonpitting.   Echocardiogram to be done and will monitor.   DVT prophylaxis: Lovenox Code Status: Full code Family Communication:  Last night patient's daughter was  informed over the phone.    The findings was currently discussed with the patient in detail. Disposition Plan: Home ,  will remain to be admitted for close neurological monitoring, pending completion of TIA/ stroke work-up. Consults called: Neurology Dr. Malen Gauze Admission status: Inpatient    Cultures; None   Antimicrobials: None     Imaging:  MR angiogram of head without contrast;  1. Patchy acute ischemic nonhemorrhagic left ACA territory infarcts involving the left parasagittal frontoparietal region. No associated mass effect. 2. Chronic right PCA territory infarct involving the right occipital lobe. 3. Underlying age-related cerebral atrophy with moderate chronic  microvascular ischemic disease, with multiple additional remote lacunar infarcts involving the bilateral basal ganglia, thalami, and cerebellum.  MRA HEAD IMPRESSION:  1. Negative intracranial MRA for large vessel occlusion. No hemodynamically significant or proximal correctable stenosis. 2. Distal small vessel atheromatous irregularity throughout the intracranial circulation.  CT of the head  IMPRESSION: 1. Prior infarct in the medial right occipital lobe. Prior small lacunar infarct in the left thalamus. Lacunar infarcts in each internal capsule region noted.  2. Small vessel disease in the centra semiovale bilaterally. There are several age uncertain lacunar type infarcts in the periventricular white matter. An acute infarct in the centra semiovale cannot be excluded.  3.  No evident mass or hemorrhage. 4. 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 Inferior mastoid air cell disease on the right.  5.  Foci of arterial vascular calcification noted.  6.  Mucosal thickening in several ethmoid air cells.  MRA of neck:   2D echocardiogram:    Procedures:   None   Antimicrobials:  Anti-infectives (From admission, onward)   None       Medication:  . amLODipine  10 mg Oral Daily  . aspirin  81 mg Oral Daily  . clopidogrel  75 mg Oral Daily  . diclofenac  75 mg Oral BID  . enoxaparin (LOVENOX) injection  40 mg Subcutaneous Daily  . glipiZIDE  5 mg Oral QAC breakfast  . insulin aspart  0-5 Units Subcutaneous QHS  . insulin aspart  0-9 Units Subcutaneous TID WC  . lisinopril  20 mg Oral Daily  . metoprolol succinate  50 mg Oral Daily  . rosuvastatin  40 mg Oral Daily  . sodium chloride flush  3 mL Intravenous Once    acetaminophen **OR** acetaminophen (TYLENOL) oral liquid 160 mg/5 mL **OR** acetaminophen, senna-docusate   Objective:   Vitals:   06/27/19 0001 06/27/19 0258 06/27/19 0747 06/27/19 0947  BP: 106/69 (!) 139/52 132/64   Pulse:    82   Resp:   13   Temp: 98.3 F (36.8 C) 98.2 F (36.8 C) 97.9 F (36.6 C) 98 F (36.7 C)  TempSrc: Oral Oral Oral Oral  SpO2:   98%   Weight:      Height:        Intake/Output Summary (Last 24 hours) at 06/27/2019 1119 Last data filed at 06/27/2019 0623 Gross per 24 hour  Intake 904.89 ml  Output -  Net 904.89 ml   Filed Weights   06/27/19 0000  Weight: 114.5 kg     Examination:   Physical Exam  Constitution:  Alert, cooperative, no distress,  Appears calm and comfortable  Psychiatric: Normal and stable mood and affect, cognition intact,   HEENT: Normocephalic, PERRL, otherwise with in Normal limits  Chest:Chest symmetric Cardio vascular:  S1/S2, RRR, No murmure, No Rubs or Gallops  pulmonary: Clear to auscultation bilaterally, respirations unlabored,  negative wheezes / crackles Abdomen: Soft, non-tender, non-distended, bowel sounds,no masses, no organomegaly Muscular skeletal: Limited exam - in bed, able to move all 4 extremities, Normal strength,  Neuro: CNII-XII intact. , normal motor and sensation, reflexes intact  Extremities: No pitting edema lower extremities, +2 pulses  Skin: Dry, warm to touch, negative for any Rashes, No open wounds Wounds: per nursing documentation  LABs:  CBC Latest Ref Rng & Units 06/27/2019 06/26/2019 10/15/2018  WBC 4.0 - 10.5 K/uL 8.0 8.7 6.6  Hemoglobin 12.0 - 15.0 g/dL 12.2 12.8 12.4  Hematocrit 36.0 - 46.0 % 38.2 40.8 37.5  Platelets 150 - 400 K/uL 209 236 217.0   CMP Latest Ref Rng & Units 06/27/2019 06/26/2019 10/15/2018  Glucose 70 - 99 mg/dL 170(H) 172(H) 124(H)  BUN 8 - 23 mg/dL 28(H) 31(H) 39(H)  Creatinine 0.44 - 1.00 mg/dL 1.11(H) 1.32(H) 1.10  Sodium 135 - 145 mmol/L 140 139 140  Potassium 3.5 - 5.1 mmol/L 4.1 4.4 4.8  Chloride 98 - 111 mmol/L 108 106 106  CO2 22 - 32 mmol/L 20(L) 21(L) 26  Calcium 8.9 - 10.3 mg/dL 9.7 10.2 10.1  Total Protein 6.5 - 8.1 g/dL 7.0 8.0 7.0  Total Bilirubin 0.3 - 1.2 mg/dL 0.2(L) 0.4 0.3   Alkaline Phos 38 - 126 U/L 61 70 52  AST 15 - 41 U/L 17 19 14   ALT 0 - 44 U/L 22 25 16       SIGNED: Deatra James, MD, FACP, FHM. [facility],  Pager [phone]  If 7PM-7AM, please contact night-coverage [email or web address], Password [facility] 06/27/2019, 11:19 AM

## 2019-06-27 NOTE — Evaluation (Signed)
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   Clinical Impression   Pt PTA: lives alone was mostly independent with a housekeeper to assist with IADL/cleaning. Pt reports independence with ADL and mobility with RW at times. Pt currently limited with poor activity tolerance, decreased strength, and increased assist to perform ADL. Pt MinA overall for ADL and pt minguardA for mobility- leans on elbows on sink; furniture cruises without RW. Pt would greatly benefit from continued OT skilled services for ADL, mobility and energy conservation. OT following acutely.  Pt could benefit from either SNF due to living alone, but if pt has family support, HHOT could be appropriate with progression of care/safety.     Follow Up Recommendations  SNF;Home health OT;Supervision/Assistance - 24 hour(pt requires additional supervision; may progress to [facility].)    Equipment Recommendations       Recommendations for Other Services PT consult(ordered)     Precautions / Restrictions Precautions Precautions: Fall Restrictions Weight Bearing Restrictions: No      Mobility Bed Mobility Overal bed mobility: Needs Assistance Bed Mobility: Supine to Sit     Supine to sit: Min assist     General bed mobility comments: for trunk elevation  Transfers Overall transfer level: Needs assistance Equipment used: Rolling walker (2 wheeled) Transfers: Sit to/from [facility] Sit to Stand: Min guard Stand pivot transfers: Min guard       General transfer comment: for stability and to reduce leaning forward    Balance Overall balance assessment: Needs assistance Sitting-balance support: No upper extremity supported;Feet  supported Sitting balance-Leahy Scale: Good     Standing balance support: Bilateral upper extremity supported Standing balance-Leahy Scale: Poor Standing balance comment: leans on elbows on sink; furniture cruises without RW                           ADL either performed or assessed with clinical judgement   ADL Overall ADL's : Needs assistance/impaired Eating/Feeding: Modified independent   Grooming: Min guard;Sitting   Upper Body Bathing: Set up;Sitting   Lower Body Bathing: Minimal assistance;Sitting/lateral leans;Sit to/from stand   Upper Body Dressing : Set up;Sitting   Lower Body Dressing: Minimal assistance;Sitting/lateral leans;Sit to/from stand   Toilet Transfer: Min guard;BSC;Stand-pivot;RW   Toileting- Water quality scientist and Hygiene: Min guard;Sitting/lateral lean;Sit to/from stand       Functional mobility during ADLs: Min guard;Rolling walker;Cueing for safety General ADL Comments: Pt appears close to baseline. MinguardA to minA overall.     Vision Baseline Vision/History: No visual deficits Vision Assessment?: No apparent visual deficits     Perception     Praxis      Pertinent Vitals/Pain Pain Assessment: No/denies pain     Hand Dominance Left   Extremity/Trunk Assessment Upper Extremity Assessment Upper Extremity Assessment: Overall WFL for tasks assessed   Lower Extremity Assessment Lower Extremity Assessment: Defer to PT evaluation;Generalized weakness   Cervical / Trunk Assessment Cervical / Trunk Assessment: Normal   Communication Communication Communication: Expressive difficulties(increased time)   Cognition Arousal/Alertness: Awake/alert Behavior During Therapy: WFL for tasks assessed/performed Overall Cognitive Status: Within Functional Limits for tasks assessed  General Comments  Pt's speech slurred and not always intelligible.    Exercises     Shoulder  Instructions      Home Living Family/patient expects to be discharged to:: Private residence Living Arrangements: Alone Available Help at Discharge: Family;Available PRN/intermittently Type of Home: House Home Access: Stairs to enter [employer] of Steps: 4 Entrance Stairs-Rails: Can reach both Home Layout: One level     Bathroom Shower/Tub: Tub/shower unit;Walk-in shower   Bathroom Toilet: Handicapped height     Home Equipment: Environmental consultant - 2 wheels;Shower seat;Bedside commode;Cane - single point          Prior Functioning/Environment Level of Independence: Independent with assistive device(s)                 OT Problem List: Decreased strength;Decreased activity tolerance;Decreased safety awareness      OT Treatment/Interventions: Self-care/ADL training;Therapeutic exercise;Neuromuscular education;Energy conservation;DME and/or AE instruction;Therapeutic activities;Patient/family education;Balance training;Cognitive remediation/compensation    OT Goals(Current goals can be found in the care plan section) Acute Rehab OT Goals Patient Stated Goal: to walk better OT Goal Formulation: With patient Time For Goal Achievement: 07/11/19 Potential to Achieve Goals: Good ADL Goals Pt Will Perform Grooming: with modified independence;standing Pt Will Perform Upper Body Dressing: with modified independence;sitting Pt Will Perform Lower Body Dressing: with supervision;sit to/from stand Pt Will Perform Toileting - Clothing Manipulation and hygiene: with supervision;sitting/lateral leans;sit to/from stand Additional ADL Goal #1: Pt will perform higher level executive functioning task with 1-2 verbal cues and with 90% accuracy.  OT Frequency: Min 2X/week   Barriers to D/C: Decreased caregiver support  pt lives alone       Co-evaluation              AM-PAC OT "6 Clicks" Daily Activity     Outcome Measure Help from another person eating meals?: None Help from  another person taking care of personal grooming?: A Little Help from another person toileting, which includes using toliet, bedpan, or urinal?: A Little Help from another person bathing (including washing, rinsing, drying)?: A Lot Help from another person to put on and taking off regular upper body clothing?: A Little Help from another person to put on and taking off regular lower body clothing?: A Lot 6 Click Score: 17   End of Session Equipment Utilized During Treatment: Gait belt;Rolling walker Nurse Communication: Mobility status  Activity Tolerance: Patient tolerated treatment well Patient left: in chair;with call bell/phone within reach;with chair alarm set  OT Visit Diagnosis: Unsteadiness on feet (R26.81);Muscle weakness (generalized) (M62.81)                Time: 9147-8295 OT Time Calculation (min): 25 min Charges:  OT General Charges $OT Visit: 1 Visit OT Evaluation $OT Eval Moderate Complexity: 1 Mod OT Treatments $Self Care/Home Management : 8-22 mins  Ebony Hail Harold Hedge) Marsa Aris OTR/L Acute Rehabilitation Services Pager: [phone] Office: [place] 06/27/2019, 4:12 PM

## 2019-06-27 NOTE — Progress Notes (Signed)
PT Cancellation Note  Patient Details Name: [name] MRN: [id number] DOB: 1946-10-10   Cancelled Treatment:    Reason Eval/Treat Not Completed: Patient at procedure or test/unavailable. Pt off of the floor for MRI at this time. PT will continue to follow up with pt acutely as available.    Clearnce Sorrel Kamelia Lampkins 06/27/2019, 4:04 PM

## 2019-06-27 NOTE — Plan of Care (Signed)
Pt doing well. Tolerated MRI well w/ IV Ativan.

## 2019-06-27 NOTE — Progress Notes (Signed)
VASCULAR LAB PRELIMINARY  PRELIMINARY  PRELIMINARY  PRELIMINARY  Carotid duplex completed.    Preliminary report:  See CV proc for preliminary results.   Amandy Chubbuck, RVT 06/27/2019, 11:47 AM

## 2019-06-28 DIAGNOSIS — Z20828 Contact with and (suspected) exposure to other viral communicable diseases: Secondary | ICD-10-CM | POA: Diagnosis not present

## 2019-06-28 DIAGNOSIS — I639 Cerebral infarction, unspecified: Secondary | ICD-10-CM | POA: Diagnosis present

## 2019-06-28 DIAGNOSIS — E782 Mixed hyperlipidemia: Secondary | ICD-10-CM | POA: Diagnosis not present

## 2019-06-28 DIAGNOSIS — E1151 Type 2 diabetes mellitus with diabetic peripheral angiopathy without gangrene: Secondary | ICD-10-CM | POA: Diagnosis present

## 2019-06-28 DIAGNOSIS — Z87891 Personal history of nicotine dependence: Secondary | ICD-10-CM | POA: Diagnosis not present

## 2019-06-28 DIAGNOSIS — Z791 Long term (current) use of non-steroidal anti-inflammatories (NSAID): Secondary | ICD-10-CM | POA: Diagnosis not present

## 2019-06-28 DIAGNOSIS — Z8673 Personal history of transient ischemic attack (TIA), and cerebral infarction without residual deficits: Secondary | ICD-10-CM | POA: Diagnosis not present

## 2019-06-28 DIAGNOSIS — Z7984 Long term (current) use of oral hypoglycemic drugs: Secondary | ICD-10-CM | POA: Diagnosis not present

## 2019-06-28 DIAGNOSIS — I10 Essential (primary) hypertension: Secondary | ICD-10-CM | POA: Diagnosis not present

## 2019-06-28 DIAGNOSIS — Z79899 Other long term (current) drug therapy: Secondary | ICD-10-CM | POA: Diagnosis not present

## 2019-06-28 DIAGNOSIS — R4701 Aphasia: Secondary | ICD-10-CM | POA: Diagnosis not present

## 2019-06-28 DIAGNOSIS — R29702 NIHSS score 2: Secondary | ICD-10-CM | POA: Diagnosis present

## 2019-06-28 DIAGNOSIS — R29810 Facial weakness: Secondary | ICD-10-CM | POA: Diagnosis not present

## 2019-06-28 DIAGNOSIS — I63531 Cerebral infarction due to unspecified occlusion or stenosis of right posterior cerebral artery: Secondary | ICD-10-CM | POA: Diagnosis not present

## 2019-06-28 DIAGNOSIS — Z9119 Patient's noncompliance with other medical treatment and regimen: Secondary | ICD-10-CM | POA: Diagnosis not present

## 2019-06-28 LAB — BASIC METABOLIC PANEL
Anion gap: 11 (ref 5–15)
BUN: 17 mg/dL (ref 8–23)
CO2: 22 mmol/L (ref 22–32)
Calcium: 9.3 mg/dL (ref 8.9–10.3)
Chloride: 107 mmol/L (ref 98–111)
Creatinine, Ser: 0.86 mg/dL (ref 0.44–1.00)
GFR calc Af Amer: >60 mL/min (ref >60)
GFR calc non Af Amer: >60 mL/min (ref >60)
Glucose, Bld: 178 mg/dL — ABNORMAL HIGH (ref 70–99)
Potassium: 4.3 mmol/L (ref 3.5–5.1)
Sodium: 140 mmol/L (ref 135–145)

## 2019-06-28 LAB — GLUCOSE, CAPILLARY
Glucose-Capillary: 167 mg/dL — ABNORMAL HIGH (ref 70–99)
Glucose-Capillary: 195 mg/dL — ABNORMAL HIGH (ref 70–99)
Glucose-Capillary: 135 mg/dL — ABNORMAL HIGH (ref 70–99)
Glucose-Capillary: 257 mg/dL — ABNORMAL HIGH (ref 70–99)

## 2019-06-28 MED ORDER — ASPIRIN 81 MG PO CHEW: 81.0000 mg | CHEWABLE_TABLET | Freq: Every day | ORAL | 3 refills | Status: AC

## 2019-06-28 MED ORDER — CLOPIDOGREL BISULFATE 75 MG PO TABS: 75.0000 mg | ORAL_TABLET | Freq: Every day | ORAL | 0 refills | Status: DC

## 2019-06-28 NOTE — Evaluation (Signed)
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 PMH includes HTN and DM.   Clinical Impression  Pt admitted secondary to problem above with deficits below. Pt with very poor safety awareness and poor insight into current deficits. Pt requiring min A to ambulate with RW and demonstrated very poor safety awareness with use of RW. When attempting to educated about deficits, pt yelling "I want to go home!" and was insistent she could care for herself. Feel pt would benefit from SNF level therapies prior to return home, however, pt will likely refuse. Feel she will need increased assist at home given current deficits and recommend maximizing [facility] services. Will continue to follow acutely to maximize functional mobility independence and safety.     Follow Up Recommendations SNF;Supervision/Assistance - 24 hour(pt likely to refuse; will require HHPT at d/c if refuses )    Equipment Recommendations  None recommended by PT    Recommendations for Other Services       Precautions / Restrictions Precautions Precautions: Fall Restrictions Weight Bearing Restrictions: No      Mobility  Bed Mobility               General bed mobility comments: In chair upon entry.   Transfers Overall transfer level: Needs assistance Equipment used: Rolling walker (2 wheeled) Transfers: Sit to/from Stand Sit to Stand: Min guard         General transfer comment: Min guard for stability. Cues for safe hand placement.   Ambulation/Gait Ambulation/Gait assistance: Min assist;Min guard Gait Distance (Feet): 50 Feet Assistive device: Rolling walker (2 wheeled) Gait Pattern/deviations: Step-through pattern;Decreased stride length;Trunk  flexed Gait velocity: Decreased   General Gait Details: Very flexed posture throughout with poor proximity to device. Pt not responding to cues to fix posture throughout stating "I walk like this normally." Pt with unsafe use of RW throughout as well. Min guard to min A required for steadying throughout. Pt notably fatigued following gait.   Stairs            Wheelchair Mobility    Modified Rankin (Stroke Patients Only)       Balance Overall balance assessment: Needs assistance Sitting-balance support: No upper extremity supported;Feet supported Sitting balance-Leahy Scale: Good     Standing balance support: Bilateral upper extremity supported;During functional activity Standing balance-Leahy Scale: Poor Standing balance comment: Reliant on UE and external support                              Pertinent Vitals/Pain Pain Assessment: No/denies pain    Home Living Family/patient expects to be discharged to:: Private residence Living Arrangements: Non-relatives/Friends Available Help at Discharge: Family;Available PRN/intermittently Type of Home: House Home Access: Stairs to enter Entrance Stairs-Rails: Can reach both Entrance Stairs-Number of Steps: 6 Home Layout: One level Home Equipment: Walker - 2 wheels;Shower seat;Bedside commode;Cane - single point      Prior Function Level of Independence: Independent with assistive device(s)         Comments: Reports being independent inside of home. Used RW for outside of home.      Hand Dominance        Extremity/Trunk Assessment   Upper Extremity Assessment Upper Extremity Assessment: Defer to OT  evaluation    Lower Extremity Assessment Lower Extremity Assessment: Generalized weakness    Cervical / Trunk Assessment Cervical / Trunk Assessment: Kyphotic  Communication   Communication: Expressive difficulties  Cognition Arousal/Alertness: Awake/alert Behavior During Therapy: WFL for tasks  assessed/performed Overall Cognitive Status: No family/caregiver present to determine baseline cognitive functioning                                 General Comments: Pt with poor safety awareness and poor insight into deficits. Pt also with slowed processing. Asked pt to fix sock as it was sliding up, and pt started reaching for lines and leads and then gown. Required assist to fix sock even with multimodal cues. Pt stating she could do everything she needed at home despite current deficits.       General Comments General comments (skin integrity, edema, etc.): Educated pt on current deficits and need for increased assist at d/c, however, pt yelling "I want to go home! I can do everything I need to do at home!" Pt inisitent that she can care for herself.     Exercises     Assessment/Plan    PT Assessment Patient needs continued PT services  PT Problem List Decreased cognition;Decreased safety awareness;Decreased knowledge of use of DME;Decreased mobility;Decreased balance;Decreased activity tolerance;Decreased strength       PT Treatment Interventions DME instruction;Gait training;Functional mobility training;Therapeutic activities;Stair training;Therapeutic exercise;Balance training;Cognitive remediation    PT Goals (Current goals can be found in the Care Plan section)  Acute Rehab PT Goals Patient Stated Goal: to go home PT Goal Formulation: With patient Time For Goal Achievement: 07/12/19 Potential to Achieve Goals: Fair    Frequency Min 4X/week   Barriers to discharge Decreased caregiver support      Co-evaluation               AM-PAC PT "6 Clicks" Mobility  Outcome Measure Help needed turning from your back to your side while in a flat bed without using bedrails?: A Little Help needed moving from lying on your back to sitting on the side of a flat bed without using bedrails?: A Little Help needed moving to and from a bed to a chair (including a  wheelchair)?: A Little Help needed standing up from a chair using your arms (e.g., wheelchair or bedside chair)?: A Little Help needed to walk in hospital room?: A Little Help needed climbing 3-5 steps with a railing? : Total 6 Click Score: 16    End of Session Equipment Utilized During Treatment: Gait belt Activity Tolerance: Patient tolerated treatment well Patient left: in chair;with call bell/phone within reach;with chair alarm set Nurse Communication: Mobility status PT Visit Diagnosis: Unsteadiness on feet (R26.81);Muscle weakness (generalized) (M62.81)    Time: 5537-4827 PT Time Calculation (min) (ACUTE ONLY): 27 min   Charges:   PT Evaluation $PT Eval Moderate Complexity: 1 Mod PT Treatments $Gait Training: 8-22 mins        Leighton Ruff, PT, DPT  Acute Rehabilitation Services  Pager: [phone] Office: [phone]   Rudean Hitt 06/28/2019, 1:49 PM

## 2019-06-28 NOTE — Consult Note (Signed)
Neurology Consult Note   Consult Date: 06/28/2019  Referring Physician: Dr. Roger Shelter  Reason for Consult: L ACA stroke  History of Present Illness  [name] is a 73 y.o. female past medical history of diabetes, hypertension, hyperlipidemia, who presented for complaints of expressive aphasia and dysarthria and admitted for CVA work up.   At the time of admission, CTH showed multiple lacunar infarcts and possibly subacute infarcts.  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  Since admission patient has returned to her neurological baseline and no longer having any notable speech difficulties.  Work-up for stroke resulted in A1c of 7.6, LDL 89, echo showed EF > 65% with severe LVH, severe aortic valve calcification, and moderate calcification of mitral valve.  Past Medical History  Past Medical History:  Diagnosis Date  . Acute bronchitis 07/22/2008  . Diabetes mellitus without complication ([place]) 2/84/1324  . 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 Hypertension     Past Surgical History:  Procedure Laterality Date  . EYE SURGERY Right 11/2018   implant  . 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 NECK SURGERY     Remove a tumor  . TYMPANOSTOMY     Dr. Thornell Mule    MEDICATIONS FOR CURRENT ENCOUNTER:  . SCHEDULED MEDICATIONS: @CMEDSCH @   . CONTINUOUS MEDICATIONS: @CMEDIV @   . PRN MEDICATIONS: @CMEDPRN @   Allergies  Allergies  Allergen Reactions  . Hydromet [Hydrocodone-Homatropine] Itching     Social History  Social History   Tobacco Use  . Smoking status: Former Smoker    Packs/day: 0.50    Years: 48.00    Pack years: 24.00    Types: Cigarettes    Start date: 11/26/1968  . Smokeless tobacco: Never Used  . Tobacco comment: trying to quit;  not really motivated at present  Substance Use Topics  . Alcohol use:  Yes    Alcohol/week: 0.0 standard drinks    Comment: rare  . Drug use: No     Family History  Family History  Problem Relation Age of Onset  . Heart disease Father        No details     REVIEW OF SYSTEMS:  General ROS: negative for - chills, fatigue, fever, night sweats, weight gain or weight loss Ophthalmic ROS: negative for - blurry vision, double vision, eye pain or loss of vision ENT ROS: negative for - epistaxis, nasal discharge, oral lesions, sore throat, tinnitus or vertigo Respiratory ROS: negative for - cough,  shortness of breath or wheezing Cardiovascular ROS: negative for - chest pain, dyspnea on exertion,  Gastrointestinal ROS: negative for - abdominal pain, diarrhea,  nausea/vomiting or stool incontinence Genito-Urinary ROS: negative for - dysuria, hematuria, incontinence or urinary frequency/urgency Musculoskeletal ROS: negative for - joint swelling or muscular weakness Neurological ROS: as noted in HPI  Exam  BP 133/69 (BP Location: Right Arm)   Pulse 72   Temp 98 F (36.7 C) (Oral)   Resp 20   Ht 5\' 5"  (1.651 m)   Wt 114.5 kg   SpO2 100%   BMI 42.01 kg/m    GEN: NAD, pleasant, cooperative CVS: RRR, no carotid bruit CHEST: No signs of resp distress, on room air ABD: Soft, NTTP  Neurological Examination:  Mental Status: Awake, Alert. Oriented x3. Follows commands, has normal fund of knowledge, attention, short term recall, fluency, comprehension and insight.  Cranial  Nerves:  II: Pupils equal and reactive, no RAPD, normal visual field and fundus  III, IV, VI: EOM intact, no gaze preference or deviation  V: normal  VII: mild L facial droop VIII: normal hearing to speech  MOTOR: 5/5 in both upper and lower extremities  REFLEXES: 2/4 throughout, bilateral flexor planters  SENSORY: Normal to touch, temperature & pin prick in all extremiteis  COORD: Normal finger to nose and heel to shin, no tremor, no dysmetria Gait: deferred  Lab Review  Lipid Panel      Component Value Date/Time   CHOL 160 06/27/2019 0323   TRIG 77 06/27/2019 0323   HDL 56 06/27/2019 0323   CHOLHDL 2.9 06/27/2019 0323   VLDL 15 06/27/2019 0323   LDLCALC 89 06/27/2019 0323   LDLDIRECT 231.0 07/23/2016 1022   A1c 7.6  Imaging  CT-scan of the brain  Ct Head Wo Contrast  Result Date: 06/26/2019 CLINICAL DATA:  Slurred speech EXAM: CT HEAD WITHOUT CONTRAST TECHNIQUE: Contiguous axial images were obtained from the base of the skull through the vertex without intravenous contrast. COMPARISON:  None. FINDINGS: Brain: The ventricles and sulci are within normal limits for age. There is no intracranial mass, hemorrhage, extra-axial fluid collection, or midline shift. There is evidence of a prior appearing infarct in the medial right occipital lobe. There is evidence of a prior lacunar type infarct in the left thalamus as well as in the genu of the left internal capsule. There is small vessel disease in the centra semiovale bilaterally with patchy age uncertain white matter infarcts in these areas. There is small vessel disease in the anterior and posterior limbs of the right internal capsule. Vascular: No hyperdense vessel evident. There is calcification in each distal vertebral artery and in the carotid siphon regions. Skull: The bony calvarium appears intact. Sinuses/Orbits: There is mucosal thickening in several ethmoid air cells. Other visualized paranasal sinuses are clear. Orbits appear symmetric bilaterally except for previous cataract removal on the right. Other: There is extensive mastoid air cell opacification on the left. There is opacification of several inferior mastoid air cells on the right. IMPRESSION: 1. Prior infarct in the medial right occipital lobe. Prior small lacunar infarct in the left thalamus. Lacunar infarcts in each internal capsule region noted. 2. Small vessel disease in the centra semiovale bilaterally. There are several age uncertain lacunar type infarcts  in the periventricular white matter. An acute infarct in the centra semiovale cannot be excluded. 3.  No evident mass or hemorrhage. 4. 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 Inferior mastoid air cell disease on the right. 5.  Foci of arterial vascular calcification noted. 6.  Mucosal thickening in several ethmoid air cells. Electronically Signed   By: Lowella Grip III M.D.   On: 06/26/2019 21:00   Mr Angio Head Wo Contrast  Result Date: 06/27/2019 CLINICAL DATA:  Initial evaluation for acute stroke, slurred speech. EXAM: MRI HEAD WITHOUT CONTRAST MRA HEAD WITHOUT CONTRAST TECHNIQUE: Multiplanar, multiecho pulse sequences of the brain and surrounding structures were obtained without intravenous contrast. Angiographic images of the head were obtained using MRA technique without contrast. COMPARISON:  Prior CT from 06/26/2019. FINDINGS: MRI HEAD FINDINGS Brain: Diffuse prominence of the CSF containing spaces compatible generalized age-related cerebral atrophy. Patchy and confluent T2/FLAIR hyperintensity within the periventricular and deep white matter both cerebral hemispheres most consistent with chronic small vessel ischemic disease, moderate to advanced in nature. Chronic microvascular ischemic changes present within the pons. Multiple superimposed remote lacunar  infarcts present within the bilateral basal ganglia and thalami. Few small bilateral cerebellar infarcts noted. Encephalomalacia and gliosis within the right occipital lobe compatible with chronic right PCA territory infarct. Associated mild chronic hemosiderin staining noted within this region. Patchy multifocal areas of restricted diffusion seen involving the parasagittal left frontoparietal region, compatible with acute left ACA territory infarct. Involvement of the splenium noted. No associated hemorrhage or mass effect. No other evidence for acute or subacute ischemia. Gray-white matter differentiation otherwise maintained. No mass  lesion, midline shift or mass effect. No hydrocephalus. No extra-axial fluid collection. Pituitary gland suprasellar region normal. Vascular: Major intracranial vascular flow voids are maintained. Skull and upper cervical spine: Craniocervical junction normal. Upper cervical spine within normal limits. Bone marrow signal intensity normal. No scalp soft tissue abnormality. Sinuses/Orbits: Patient status post ocular lens replacement on the right. Globes and orbital soft tissues demonstrate no acute finding. Paranasal sinuses are clear. Bilateral mastoid effusions noted, left larger than right. Inner ear structures grossly normal. Other: None. MRA HEAD FINDINGS ANTERIOR CIRCULATION: Distal cervical segments of the internal carotid arteries are patent with symmetric antegrade flow. Petrous segments widely patent bilaterally. Atheromatous irregularity within the cavernous/supraclinoid ICAs without hemodynamically significant stenosis. ICA termini well perfused. A1 segments patent bilaterally. Left A1 hypoplastic, accounting for the slightly diminutive left ICA is compared to the right. Anterior cerebral arteries widely patent to their distal aspects without stenosis. M1 segments widely patent bilaterally. Negative MCA bifurcations. Distal MCA branches well perfused and symmetric. POSTERIOR CIRCULATION: Vertebral arteries patent to the vertebrobasilar junction without stenosis. Left vertebral artery dominant. Patent right PICA. Left PICA not seen. Basilar patent to its distal aspect without stenosis. Dominant left anterior inferior cerebral artery. Superior cerebral arteries patent bilaterally. Both of the posterior cerebral arteries are well perfused to their distal aspects. Mild distal small vessel atheromatous irregularity noted throughout the intracranial circulation. IMPRESSION: MRI HEAD IMPRESSION: 1. Patchy acute ischemic nonhemorrhagic left ACA territory infarcts involving the left parasagittal frontoparietal  region. No associated mass effect. 2. Chronic right PCA territory infarct involving the right occipital lobe. 3. Underlying age-related cerebral atrophy with moderate chronic microvascular ischemic disease, with multiple additional remote lacunar infarcts involving the bilateral basal ganglia, thalami, and cerebellum. MRA HEAD IMPRESSION: 1. Negative intracranial MRA for large vessel occlusion. No hemodynamically significant or proximal correctable stenosis. 2. Distal small vessel atheromatous irregularity throughout the intracranial circulation. Electronically Signed   By: Jeannine Boga M.D.   On: 06/27/2019 03:23   Mr Angio Neck W Wo Contrast  Result Date: 06/27/2019 CLINICAL DATA:  Follow-up acute stroke with slurred speech. Left anterior cerebral artery territory infarctions. EXAM: MRA NECK WITHOUT AND WITH CONTRAST TECHNIQUE: Multiplanar and multiecho pulse sequences of the neck were obtained without and with intravenous contrast. Angiographic images of the neck were obtained using MRA technique without and with intravenous contrast. CONTRAST:  9 cc Gadavist COMPARISON:  MRI studies earlier same day. FINDINGS: Aorta appears normal. Branching pattern of the brachiocephalic vessels appears normal without origin stenosis. Both common carotid arteries are widely patent to their respective bifurcation. The right proximal internal carotid artery shows 50% stenosis but is widely patent beyond that through the skull base to the circle-of-Willis. Proximal left internal carotid artery shows 50% stenosis and is patent beyond that to the circle-of-Willis. Both vertebral arteries are patent with the left being dominant. Vertebral artery origins are not accurately evaluated given chest motion. 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 Internal carotid arteries widely patent beyond that.  No posterior circulation pathology evident. Electronically Signed   By: Nelson Chimes M.D.   On:  06/27/2019 16:42   Mr Brain Wo Contrast  Result Date: 06/27/2019 CLINICAL DATA:  Initial evaluation for acute stroke, slurred speech. EXAM: MRI HEAD WITHOUT CONTRAST MRA HEAD WITHOUT CONTRAST TECHNIQUE: Multiplanar, multiecho pulse sequences of the brain and surrounding structures were obtained without intravenous contrast. Angiographic images of the head were obtained using MRA technique without contrast. COMPARISON:  Prior CT from 06/26/2019. FINDINGS: MRI HEAD FINDINGS Brain: Diffuse prominence of the CSF containing spaces compatible generalized age-related cerebral atrophy. Patchy and confluent T2/FLAIR hyperintensity within the periventricular and deep white matter both cerebral hemispheres most consistent with chronic small vessel ischemic disease, moderate to advanced in nature. Chronic microvascular ischemic changes present within the pons. Multiple superimposed remote lacunar infarcts present within the bilateral basal ganglia and thalami. Few small bilateral cerebellar infarcts noted. Encephalomalacia and gliosis within the right occipital lobe compatible with chronic right PCA territory infarct. Associated mild chronic hemosiderin staining noted within this region. Patchy multifocal areas of restricted diffusion seen involving the parasagittal left frontoparietal region, compatible with acute left ACA territory infarct. Involvement of the splenium noted. No associated hemorrhage or mass effect. No other evidence for acute or subacute ischemia. Gray-white matter differentiation otherwise maintained. No mass lesion, midline shift or mass effect. No hydrocephalus. No extra-axial fluid collection. Pituitary gland suprasellar region normal. Vascular: Major intracranial vascular flow voids are maintained. Skull and upper cervical spine: Craniocervical junction normal. Upper cervical spine within normal limits. Bone marrow signal intensity normal. No scalp soft tissue abnormality. Sinuses/Orbits: Patient  status post ocular lens replacement on the right. Globes and orbital soft tissues demonstrate no acute finding. Paranasal sinuses are clear. Bilateral mastoid effusions noted, left larger than right. Inner ear structures grossly normal. Other: None. MRA HEAD FINDINGS ANTERIOR CIRCULATION: Distal cervical segments of the internal carotid arteries are patent with symmetric antegrade flow. Petrous segments widely patent bilaterally. Atheromatous irregularity within the cavernous/supraclinoid ICAs without hemodynamically significant stenosis. ICA termini well perfused. A1 segments patent bilaterally. Left A1 hypoplastic, accounting for the slightly diminutive left ICA is compared to the right. Anterior cerebral arteries widely patent to their distal aspects without stenosis. M1 segments widely patent bilaterally. Negative MCA bifurcations. Distal MCA branches well perfused and symmetric. POSTERIOR CIRCULATION: Vertebral arteries patent to the vertebrobasilar junction without stenosis. Left vertebral artery dominant. Patent right PICA. Left PICA not seen. Basilar patent to its distal aspect without stenosis. Dominant left anterior inferior cerebral artery. Superior cerebral arteries patent bilaterally. Both of the posterior cerebral arteries are well perfused to their distal aspects. Mild distal small vessel atheromatous irregularity noted throughout the intracranial circulation. IMPRESSION: MRI HEAD IMPRESSION: 1. Patchy acute ischemic nonhemorrhagic left ACA territory infarcts involving the left parasagittal frontoparietal region. No associated mass effect. 2. Chronic right PCA territory infarct involving the right occipital lobe. 3. Underlying age-related cerebral atrophy with moderate chronic microvascular ischemic disease, with multiple additional remote lacunar infarcts involving the bilateral basal ganglia, thalami, and cerebellum. MRA HEAD IMPRESSION: 1. Negative intracranial MRA for large vessel occlusion. No  hemodynamically significant or proximal correctable stenosis. 2. Distal small vessel atheromatous irregularity throughout the intracranial circulation. Electronically Signed   By: Jeannine Boga M.D.   On: 06/27/2019 03:23   Vas US Carotid (at [facility] Only)  Result Date: 06/28/2019 Carotid Arterial Duplex Study Indications:       CVA and Speech disturbance. Risk Factors:      Hypertension,  hyperlipidemia, Diabetes. Comparison Study:  No prior study on file for comparison. Performing Technologist: Sharion Dove RVS  Examination Guidelines: A complete evaluation includes B-mode imaging, spectral Doppler, color Doppler, and power Doppler as needed of all accessible portions of each vessel. Bilateral testing is considered an integral part of a complete examination. Limited examinations for reoccurring indications may be performed as noted.  Right Carotid Findings: +----------+--------+--------+--------+------------+------------------+           PSV cm/sEDV cm/sStenosisDescribe    Comments           +----------+--------+--------+--------+------------+------------------+ CCA Prox  75      17                          intimal thickening +----------+--------+--------+--------+------------+------------------+ CCA Distal70      15                          intimal thickening +----------+--------+--------+--------+------------+------------------+ ICA Prox  111     27              heterogenousShadowing          +----------+--------+--------+--------+------------+------------------+ ICA Distal87      21                                             +----------+--------+--------+--------+------------+------------------+ ECA       80      7                                              +----------+--------+--------+--------+------------+------------------+ +----------+--------+-------+--------+-------------------+           PSV cm/sEDV cmsDescribeArm Pressure (mmHG)  +----------+--------+-------+--------+-------------------+ [id number]                                         +----------+--------+-------+--------+-------------------+ +---------+--------+--+--------+--+ VertebralPSV cm/s83EDV cm/s12 +---------+--------+--+--------+--+  Left Carotid Findings: +----------+--------+--------+--------+--------+------------------+           PSV cm/sEDV cm/sStenosisDescribeComments           +----------+--------+--------+--------+--------+------------------+ CCA Prox  109     16                      intimal thickening +----------+--------+--------+--------+--------+------------------+ CCA Distal89      12                      intimal thickening +----------+--------+--------+--------+--------+------------------+ ICA Prox  152     31              calcific                   +----------+--------+--------+--------+--------+------------------+ ICA Distal116     25                                         +----------+--------+--------+--------+--------+------------------+ ECA       72      9                                          +----------+--------+--------+--------+--------+------------------+ +----------+--------+--------+--------+-------------------+  SubclavianPSV cm/sEDV cm/sDescribeArm Pressure (mmHG) +----------+--------+--------+--------+-------------------+           96                                          +----------+--------+--------+--------+-------------------+ +---------+--------+--+--------+--+ VertebralPSV cm/s50EDV cm/s11 +---------+--------+--+--------+--+  Summary: Right Carotid: Velocities in the right ICA are consistent with a 1-39% stenosis. Left Carotid: Velocities in the left ICA are consistent with a 1-39% stenosis. Vertebrals:  Bilateral vertebral arteries demonstrate antegrade flow. Subclavians: Normal flow hemodynamics were seen in bilateral subclavian              arteries. *See table(s)  above for measurements and observations.  Electronically signed by Antony Contras MD on 06/28/2019 at 10:55:33 AM.    Final     Assessment: 73 yo F with past medical history of diabetes, hypertension, hyperlipidemia, who presented for complaints of expressive aphasia and dysarthria, found to have an acute R ACA infarcts. 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   - CTH noted prior infarct in the medial right occipital lobe. Prior small lacunar infarct in the left thalamus. Lacunar infarcts in each internal capsule region noted. - MRA head: Negative intracranial MRA for large vessel occlusion. No hemodynamically significant or proximal correctable stenosis. Distal small vessel atheromatous irregularity throughout the intracranial circulation. - Carotid ultrasound: Right Carotid: Velocities in the right ICA are consistent with a 1-39% stenosis. Left Carotid: Velocities in the left ICA are consistent with a 1-39% stenosis. Vertebrals:  Bilateral vertebral arteries demonstrate antegrade flow. Subclavians: Normal flow hemodynamics were seen in bilateral subclavian arteries. -  A1c of 7.6, LDL 89, echo showed EF > 65% with severe LVH, severe aortic valve calcification, and moderate calcification of mitral valve. - PT/OT eval recommend SNF or Home health, patient is requesting to be discharged - Stroke risk factors: DM, HLD, HTN, prior cerebral infarction  Recommendations: 1) Start ASA 81 mg QD + Plavix 75 mg QD for 3 weeks, then continue Plavix 75 mg QD thereafter 2) Continue rosuvastatin 40 mg QD 3) Stroke follow up with outpatient neurology  4) Follow up with PCP for management of secondary stroke prevention   I have seen and examined the patient. Assessment and recommendations discussed with the Neurohospitalist PA. 73 year old female with acute left  ACA territory ischemic infarction. Full stroke work up completed. Stroke prophylaxis as above for cerebral and cervical ICA atherosclerotic disease Electronically signed: Dr. Kerney Elbe

## 2019-06-28 NOTE — Progress Notes (Addendum)
PROGRESS NOTE    Patient: [name]                            PCP: Laurey Morale, MD                    DOB: 1946-08-13            DOA: 06/26/2019 MCN:[id number]             DOS: 06/28/2019, 3:05 PM   LOS: 2 days   Date of Service: The patient was seen and examined on 06/28/2019  Subjective:   The patient was seen and examined this morning, stable no acute distress.  No focal neurological findings.  Per patient still having abnormal speech.  No obvious slurring of her speech, thought process intact  Brief Narrative:   [name] is a 73 y.o. female with medical history significant of diabetes, hypertension, hyperlipidemia, who was brought in by daughter secondary to slurred speech.  Apparently she woke up early this morning and was not able to speak for several hours.  Speech is still not back to normal.  Daughter noted that she was not sounding like herself.  She try to walk but was feeling weak and could not walk normally.  She has not had this symptoms before.  She has longstanding hypertension with diabetes.  Patient has not been compliant according to daughter not checking have blood sugar regularly.  As the day goes to symptoms have gradually improved but not back to normal.  Patient was therefore brought to the emergency room.  Patient has had mild bilateral edema otherwise has been usually active and moving around.  In the ER head CT showed multiple lacunar infarcts and possibly subacute infarcts.  She is being admitted for CVA work-up..  ED Course: Temperature is 98.6 blood pressure 167/84 pulse 86 respiratory rate of 18 oxygen sat 96% room air.  Sodium is 139 potassium 4.4 CO2 21 glucose 172.  Creatinine is 1.32 with BUN of 31.  CBC entirely within normal.  PT/INR within normal.  Head CT showed prior infarct in the medial right occipital lobe.  Also lacunar infarct in the left thalamus internal capsule noted.  No acute CVA finding.  Patient is therefore being admitted for MRI  and further testing.   Assessment & Plan:   Principal Problem:   Acute cerebrovascular accident (CVA) ([place]) Active Problems:   Diabetes mellitus without complication ([place])   HYPERLIPIDEMIA, MIXED   Essential hypertension   CVA (cerebral vascular accident) ([place])   Principal Problem: Acute cerebrovascular accident (CVA) ([place]) Active Problems: Diabetes mellitus without complication ([place]) HYPERLIPIDEMIA, MIXED Essential hypertension  Acute CVA: -Stable this morning negative any new focal neurological finding per patient speech still not normal Head CT showed no acute findings but subacute findings. MRA/MRI head:  Patchy acute ischemic nonhemorrhagic left ACA territory infarcts involving the left parasagittal frontoparietal region. No associated mass effect. -. Chronic right PCA territory infarct involving the right occipital lobe.  -CT of the headrevealing prior stroke;medial right occipital lobe. Prior small lacunar infarct in the left thalamus. Lacunar infarcts MRA of the neck: 50% stenoses at both proximal internal carotid arteries. Internal carotid arteries widely patent beyond that. No posterior circulation pathology evident. PendingEcho: Reviewed within normal limits as below  -Status post evaluation from PT OT and speech evaluation.  Neurology was consulted, spoke to neurology on call Dr. Cheral Marker in detail regarding  care of patient appreciate his recommendations.  -Initiate aspirin and Plavix, continue statins, LDL at goal 34   Diabetes:Resuming home medication of metformin A1c 7.6 -Strict diabetic diet recommended  Hypertension:Blood pressure control. Continue current medication with no changes.  hyperlipidemia: Continue with statin. Total cholesterol 160, HDL 56, LDL 89, triglycerides 77  Bilateral lower extremity edema: Probably stasis edema.  Nonpitting.  Echocardiogram to be done and will monitor.   Code Status:Full code  Family Communication:patient's daughter was informed over the phone.  The findings was currently discussed with the patient in detail. Disposition Plan:Homewith HH, in next 24 hrs  Change from inpatient to observation . was admitted for close neurological monitoring,pending completion ofTIA/stroke work-up. Consults called:Neurology Dr. Malen Gauze Admission status:Inpatient    2 D- Echo -IMPRESSIONS  1. The left ventricle has hyperdynamic systolic function, with an ejection fraction of >65%. The cavity size was normal. There is severely increased left ventricular wall thickness. Left ventricular diastolic Doppler parameters are consistent with  impaired relaxation. 2. The right ventricle has normal systolic function. The cavity was normal. There is no increase in right ventricular wall thickness. 3. The aortic valve is tricuspid. Severely thickening of the aortic valve. Severe calcifcation of the aortic valve. Mild stenosis of the aortic valve. Severe aortic annular calcification noted. 4. The mitral valve is abnormal. Moderate thickening of the mitral valve leaflet. Moderate calcification of the mitral valve leaflet. There is moderate mitral annular calcification present. No evidence of mitral valve stenosis. 5. The aorta is normal in size and structure. 6. The aortic root is normal in size and structure. 7. Pulmonary hypertension is indeterminant, inadequate TR jet.   Imaging:  MR angiogram of head without contrast;  1. Patchy acute ischemic nonhemorrhagic left ACA territory infarcts involving the left parasagittal frontoparietal region. No associated mass effect. 2. Chronic right PCA territory infarct involving the right occipital lobe. 3. Underlying age-related cerebral atrophy with moderate chronic microvascular ischemic disease, with multiple additional remote lacunar infarcts involving the bilateral basal ganglia, thalami, and cerebellum.  MRA HEAD  IMPRESSION:  1. Negative intracranial MRA for large vessel occlusion. No hemodynamically significant or proximal correctable stenosis. 2. Distal small vessel atheromatous irregularity throughout the intracranial circulation.  CT of the head  IMPRESSION: 1. Prior infarct in the medial right occipital lobe. Prior small lacunar infarct in the left thalamus. Lacunar infarcts in each internal capsule region noted.  2. Small vessel disease in the centra semiovale bilaterally. There are several age uncertain lacunar type infarcts in the periventricular white matter. An acute infarct in the centra semiovale cannot be excluded.  3.  No evident mass or hemorrhage. 4. 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 Inferior mastoid air cell disease on the right.  5.  Foci of arterial vascular calcification noted.  6.  Mucosal thickening in several ethmoid air cells.  MRA of neck:   2D echocardiogram:    Procedures:   None   Antimicrobials:  Anti-infectives (From admission, onward)   None       Medication:  . amLODipine  10 mg Oral Daily  . aspirin  81 mg Oral Daily  . clopidogrel  75 mg Oral Daily  . diclofenac  75 mg Oral BID  . enoxaparin (LOVENOX) injection  40 mg Subcutaneous Daily  . glipiZIDE  5 mg Oral QAC breakfast  . insulin aspart  0-5 Units Subcutaneous QHS  . insulin aspart  0-9 Units Subcutaneous TID WC  . lisinopril  20 mg Oral Daily  .  metoprolol succinate  50 mg Oral Daily  . rosuvastatin  40 mg Oral Daily  . sodium chloride flush  3 mL Intravenous Once    acetaminophen **OR** acetaminophen (TYLENOL) oral liquid 160 mg/5 mL **OR** acetaminophen, senna-docusate   Objective:   Vitals:   06/28/19 0019 06/28/19 0335 06/28/19 0745 06/28/19 1142  BP: (!) 178/77 140/64 133/69 (!) 141/81  Pulse: 77 69 72   Resp: 17 18 20 20   Temp: 98.1 F (36.7 C) 98.3 F (36.8 C) 98 F (36.7 C) 98.5 F (36.9 C)  TempSrc: Oral Oral Oral Oral  SpO2: 99%  100% 100% 100%  Weight:      Height:        Intake/Output Summary (Last 24 hours) at 06/28/2019 1505 Last data filed at 06/28/2019 1300 Gross per 24 hour  Intake 1080 ml  Output -  Net 1080 ml   Filed Weights   06/27/19 0000  Weight: 114.5 kg     Examination:   Physical Exam  Constitution:  Alert, cooperative, no distress,  Appears calm and comfortable  Psychiatric: Normal and stable mood and affect, cognition intact,   HEENT: Normocephalic, PERRL, otherwise with in Normal limits  Chest:Chest symmetric Cardio vascular:  S1/S2, RRR, No murmure, No Rubs or Gallops  pulmonary: Clear to auscultation bilaterally, respirations unlabored, negative wheezes / crackles Abdomen: Soft, non-tender, non-distended, bowel sounds,no masses, no organomegaly Muscular skeletal: Limited exam - in bed, able to move all 4 extremities, Normal strength,  Neuro: CNII-XII intact. , normal motor and sensation, reflexes intact  Extremities: No pitting edema lower extremities, +2 pulses  Skin: Dry, warm to touch, negative for any Rashes, No open wounds Wounds: per nursing documentation  LABs:  CBC Latest Ref Rng & Units 06/27/2019 06/26/2019 10/15/2018  WBC 4.0 - 10.5 K/uL 8.0 8.7 6.6  Hemoglobin 12.0 - 15.0 g/dL 12.2 12.8 12.4  Hematocrit 36.0 - 46.0 % 38.2 40.8 37.5  Platelets 150 - 400 K/uL 209 236 217.0   CMP Latest Ref Rng & Units 06/28/2019 06/27/2019 06/26/2019  Glucose 70 - 99 mg/dL 178(H) 170(H) 172(H)  BUN 8 - 23 mg/dL 17 28(H) 31(H)  Creatinine 0.44 - 1.00 mg/dL 0.86 1.11(H) 1.32(H)  Sodium 135 - 145 mmol/L 140 140 139  Potassium 3.5 - 5.1 mmol/L 4.3 4.1 4.4  Chloride 98 - 111 mmol/L 107 108 106  CO2 22 - 32 mmol/L 22 20(L) 21(L)  Calcium 8.9 - 10.3 mg/dL 9.3 9.7 10.2  Total Protein 6.5 - 8.1 g/dL - 7.0 8.0  Total Bilirubin 0.3 - 1.2 mg/dL - 0.2(L) 0.4  Alkaline Phos 38 - 126 U/L - 61 70  AST 15 - 41 U/L - 17 19  ALT 0 - 44 U/L - 22 25      SIGNED: Deatra James, MD, FACP, FHM. [facility],  Pager [phone]  If 7PM-7AM, please contact night-coverage [email or web address] [facility] 06/28/2019, 3:05 PM

## 2019-06-29 LAB — BASIC METABOLIC PANEL
Anion gap: 9 (ref 5–15)
BUN: 19 mg/dL (ref 8–23)
CO2: 24 mmol/L (ref 22–32)
Calcium: 9.3 mg/dL (ref 8.9–10.3)
Chloride: 108 mmol/L (ref 98–111)
Creatinine, Ser: 0.97 mg/dL (ref 0.44–1.00)
GFR calc Af Amer: >60 mL/min (ref >60)
GFR calc non Af Amer: 58 mL/min — ABNORMAL LOW (ref >60)
Glucose, Bld: 168 mg/dL — ABNORMAL HIGH (ref 70–99)
Potassium: 4.3 mmol/L (ref 3.5–5.1)
Sodium: 141 mmol/L (ref 135–145)

## 2019-06-29 LAB — GLUCOSE, CAPILLARY: Glucose-Capillary: 166 mg/dL — ABNORMAL HIGH (ref 70–99)

## 2019-06-29 NOTE — Progress Notes (Signed)
NURSING PROGRESS NOTE  [name] [id number] Discharge Data: 06/29/2019 11:32 AM Attending Provider: Deatra James, MD PCP:Fry, Ishmael Holter, MD     [name] to be D/C'd Home per MD order.  Discussed with the patient the After Visit Summary and all questions fully answered. All IV's discontinued with no bleeding noted. All belongings returned to patient for patient to take home.   Last Vital Signs:  Blood pressure 134/67, pulse 66, temperature 98.9 F (37.2 C), temperature source Oral, resp. rate 20, height 5\' 5"  (1.651 m), weight 114.5 kg, SpO2 98 %.  Discharge Medication List Allergies as of 06/29/2019      Reactions   Hydromet [hydrocodone-homatropine] Itching      Medication List    TAKE these medications   acetaminophen 325 MG tablet Commonly known as: TYLENOL Take 650 mg by mouth every 6 (six) hours as needed for headache (pain).   Alcohol Prep Pads Use to test 1 X day and diagnosis code is E 11.9   amLODipine 10 MG tablet Commonly known as: NORVASC TAKE 1 TABLET BY MOUTH ONCE DAILY Notes to patient: 06/30/2019   aspirin 81 MG chewable tablet Chew 1 tablet (81 mg total) by mouth daily. Notes to patient: 06/30/2019   BENADRYL EX Apply 1 application topically 2 (two) times daily as needed (itching).   clopidogrel 75 MG tablet Commonly known as: PLAVIX Take 1 tablet (75 mg total) by mouth daily. Notes to patient: 06/30/2019   diclofenac 75 MG EC tablet Commonly known as: VOLTAREN Take 1 tablet by mouth twice daily Notes to patient: 06/29/2019   glipiZIDE 10 MG tablet Commonly known as: GLUCOTROL TAKE 1 TABLET BY MOUTH TWICE DAILY BEFORE  A  MEAL What changed: See the new instructions. Notes to patient: 06/29/2019   glucose blood test strip Commonly known as: OneTouch Verio Test 1 x day and diagnosis code is E 11.9   lisinopril 10 MG tablet Commonly known as: ZESTRIL Take 2 tablets (20 mg total) by mouth daily. Notes to patient: 06/30/2019    metFORMIN 500 MG tablet Commonly known as: GLUCOPHAGE TAKE 1 TABLET BY MOUTH TWICE DAILY WITH MEALS Notes to patient: 06/29/2019   metoprolol succinate 50 MG 24 hr tablet Commonly known as: TOPROL-XL Take 1 tablet (50 mg total) by mouth daily. Take with or immediately following a meal. Notes to patient: 06/30/2019   onetouch ultrasoft lancets Test 1 X day and diagnosis code is E 11.9   pioglitazone 30 MG tablet Commonly known as: ACTOS Take 1 tablet by mouth once daily Notes to patient: 06/30/2019   rosuvastatin 40 MG tablet Commonly known as: CRESTOR Take one tablet by mouth daily. Patient must have an OV for further refills. What changed:   how much to take  how to take this  when to take this  additional instructions Notes to patient: 06/30/2019   spironolactone 25 MG tablet Commonly known as: ALDACTONE Take 1 tablet by mouth once daily Notes to patient: 06/30/2019

## 2019-06-29 NOTE — Care Management Important Message (Signed)
Important Message  Patient Details  Name: [name] MRN: [id number] Date of Birth: May 14, 1946   Medicare Important Message Given:  Yes     Orbie Pyo 06/29/2019, 4:29 PM

## 2019-06-29 NOTE — Progress Notes (Deleted)
Physician Discharge Summary [facility]    Patient: [name]                   Admit date: 06/26/2019   DOB: 07/08/1946             Discharge date:06/29/2019/7:15 AM [id number]:[id number]                          PCP: Pamela Morale, MD  Disposition: Home with Home health   Recommendations for Outpatient Follow-up:    Follow up: PCP and [facility] in 1-2 weeks   Discharge Condition: Stable   Code Status:   Code Status: Full Code  Diet recommendation: Diabetic diet   Discharge Diagnoses:    Principal Problem:   Acute cerebrovascular accident (CVA) ([place]) Active Problems:   Diabetes mellitus without complication ([place])   HYPERLIPIDEMIA, MIXED   Essential hypertension   CVA (cerebral vascular accident) [facility])   History of Present Illness/ Hospital Course [name] Summary:   [name] a 73 y.o.femalewith medical history significant ofdiabetes, hypertension, hyperlipidemia, who was brought in by daughter secondary to slurred speech. Apparently she woke up early this morning and was not able to speak for several hours. Speech is still not back to normal. Daughter noted that she was not sounding like herself. She try to walk but was feeling weak and could not walk normally. She has not had this symptoms before. She has longstanding hypertension with diabetes. Patient has not been compliant according to daughter not checking have blood sugar regularly. As the day goes to symptoms have gradually improved but not back to normal. Patient was therefore brought to the emergency room. Patient has had mild bilateral edema otherwise has been usually active and moving around. In the ER head CT showed multiple lacunar infarcts and possibly subacute infarcts. She is being admitted for CVA work-up..  ED Course:Temperature is 98.6 blood pressure 167/84 pulse 86 respiratory rate of 18 oxygen sat 96% room air.Sodium is 139 potassium 4.4 CO2 21 glucose 172. Creatinine  is 1.32 with BUN of 31. CBC entirely within normal. PT/INR within normal. Head CT showed prior infarct in the medial right occipital lobe. Also lacunar infarct in the left thalamus internal capsule noted. No acute CVA finding. Patient is therefore being admitted for MRI and further testing.   Assessment & Plan:   Principal Problem:   Acute cerebrovascular accident (CVA) ([place]) Active Problems:   Diabetes mellitus without complication ([place])   HYPERLIPIDEMIA, MIXED   Essential hypertension  Acute CVA: -Stable this morning negative any new focal neurological finding per patient speech still not normal Head CT showed no acute findings but subacute findings. MRA/MRI head:  Patchy acute ischemic nonhemorrhagic left ACA territory infarcts involving the left parasagittal frontoparietal region. No associated mass effect. -. Chronic right PCA territory infarct involving the right occipital lobe.  -CT of the head revealing prior stroke; medial right occipital lobe. Prior small lacunar infarct in the left thalamus. Lacunar infarcts  MRA of the neck: 50% stenoses at both proximal internal carotid arteries. Internal carotid arteries widely patent beyond that. No posterior circulation pathology evident. Pending Echo:  Reviewed within normal limits as below   -Status post evaluation from PT OT and speech evaluation.  Neurology was consulted, spoke to neurology on call Dr. Cheral Marker in detail regarding care of patient appreciate his recommendations.  -Initiate aspirin and Plavix, continue statins, LDL at goal 56   [place] home  medication of metformin A1c 7.6 -Strict diabetic diet recommended  Hypertension:Blood pressure control. Continue current medication with no changes.  hyperlipidemia: Continue with statin. Total cholesterol 160, HDL 56, LDL 89, triglycerides 77  Bilateral lower extremity edema: Probably stasis edema.  Nonpitting.  Echocardiogram to be done and  will monitor.   Code Status:Full code Family Communication: patient's daughter was informed over the phone.    The findings was currently discussed with the patient in detail. Disposition Plan:Home ,  Change from inpatient to observation . was admitted for close neurological monitoring, pending completion of TIA/ stroke work-up. Consults called:Neurology Dr. Malen Gauze Admission status:Inpatient.    2 D- Echo -IMPRESSIONS   1. The left ventricle has hyperdynamic systolic function, with an ejection fraction of >65%. The cavity size was normal. There is severely increased left ventricular wall thickness. Left ventricular diastolic Doppler parameters are consistent with  impaired relaxation.  2. The right ventricle has normal systolic function. The cavity was normal. There is no increase in right ventricular wall thickness.  3. The aortic valve is tricuspid. Severely thickening of the aortic valve. Severe calcifcation of the aortic valve. Mild stenosis of the aortic valve. Severe aortic annular calcification noted.  4. The mitral valve is abnormal. Moderate thickening of the mitral valve leaflet. Moderate calcification of the mitral valve leaflet. There is moderate mitral annular calcification present. No evidence of mitral valve stenosis.  5. The aorta is normal in size and structure.  6. The aortic root is normal in size and structure.  7. Pulmonary hypertension is indeterminant, inadequate TR jet.    Discharge Instructions:   Discharge Instructions    Activity as tolerated - No restrictions   Complete by: As directed    Activity as tolerated - No restrictions   Complete by: As directed    Activity as tolerated - No restrictions   Complete by: As directed    Call MD for:  difficulty breathing, headache or visual disturbances   Complete by: As directed    Call MD for:  temperature >100.4   Complete by: As directed    Diet - low sodium heart healthy   Complete by: As directed     Discharge instructions   Complete by: As directed    Follow-up with PCP in 1 week.  Avoid any sedative medication before notifying family or PCP such as Benadryl sleeping medication.   Discharge instructions   Complete by: As directed    Cont. With ASA and Plavix X 3 wks then Plavix only daily   Increase activity slowly   Complete by: As directed    Increase activity slowly   Complete by: As directed    Increase activity slowly   Complete by: As directed        Medication List    TAKE these medications   acetaminophen 325 MG tablet Commonly known as: TYLENOL Take 650 mg by mouth every 6 (six) hours as needed for headache (pain).   Alcohol Prep Pads Use to test 1 X day and diagnosis code is E 11.9   amLODipine 10 MG tablet Commonly known as: NORVASC TAKE 1 TABLET BY MOUTH ONCE DAILY   aspirin 81 MG chewable tablet Chew 1 tablet (81 mg total) by mouth daily.   BENADRYL EX Apply 1 application topically 2 (two) times daily as needed (itching).   clopidogrel 75 MG tablet Commonly known as: PLAVIX Take 1 tablet (75 mg total) by mouth daily.   diclofenac 75 MG EC tablet Commonly  known as: VOLTAREN Take 1 tablet by mouth twice daily   glipiZIDE 10 MG tablet Commonly known as: GLUCOTROL TAKE 1 TABLET BY MOUTH TWICE DAILY BEFORE  A  MEAL What changed: See the new instructions.   glucose blood test strip Commonly known as: OneTouch Verio Test 1 x day and diagnosis code is E 11.9   lisinopril 10 MG tablet Commonly known as: ZESTRIL Take 2 tablets (20 mg total) by mouth daily.   metFORMIN 500 MG tablet Commonly known as: GLUCOPHAGE TAKE 1 TABLET BY MOUTH TWICE DAILY WITH MEALS   metoprolol succinate 50 MG 24 hr tablet Commonly known as: TOPROL-XL Take 1 tablet (50 mg total) by mouth daily. Take with or immediately following a meal.   onetouch ultrasoft lancets Test 1 X day and diagnosis code is E 11.9   pioglitazone 30 MG tablet Commonly known as: ACTOS Take  1 tablet by mouth once daily   rosuvastatin 40 MG tablet Commonly known as: CRESTOR Take one tablet by mouth daily. Patient must have an OV for further refills. What changed:   how much to take  how to take this  when to take this  additional instructions   spironolactone 25 MG tablet Commonly known as: ALDACTONE Take 1 tablet by mouth once daily       Allergies  Allergen Reactions   Hydromet [Hydrocodone-Homatropine] Itching     Procedures /Studies:   Ct Head Wo Contrast  Result Date: 06/26/2019 CLINICAL DATA:  Slurred speech EXAM: CT HEAD WITHOUT CONTRAST TECHNIQUE: Contiguous axial images were obtained from the base of the skull through the vertex without intravenous contrast. COMPARISON:  None. FINDINGS: Brain: The ventricles and sulci are within normal limits for age. There is no intracranial mass, hemorrhage, extra-axial fluid collection, or midline shift. There is evidence of a prior appearing infarct in the medial right occipital lobe. There is evidence of a prior lacunar type infarct in the left thalamus as well as in the genu of the left internal capsule. There is small vessel disease in the centra semiovale bilaterally with patchy age uncertain white matter infarcts in these areas. There is small vessel disease in the anterior and posterior limbs of the right internal capsule. Vascular: No hyperdense vessel evident. There is calcification in each distal vertebral artery and in the carotid siphon regions. Skull: The bony calvarium appears intact. Sinuses/Orbits: There is mucosal thickening in several ethmoid air cells. Other visualized paranasal sinuses are clear. Orbits appear symmetric bilaterally except for previous cataract removal on the right. Other: There is extensive mastoid air cell opacification on the left. There is opacification of several inferior mastoid air cells on the right. IMPRESSION: 1. Prior infarct in the medial right occipital lobe. Prior small  lacunar infarct in the left thalamus. Lacunar infarcts in each internal capsule region noted. 2. Small vessel disease in the centra semiovale bilaterally. There are several age uncertain lacunar type infarcts in the periventricular white matter. An acute infarct in the centra semiovale cannot be excluded. 3.  No evident mass or hemorrhage. 4. 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 Inferior mastoid air cell disease on the right. 5.  Foci of arterial vascular calcification noted. 6.  Mucosal thickening in several ethmoid air cells. Electronically Signed   By: Lowella Grip III M.D.   On: 06/26/2019 21:00   Mr Angio Head Wo Contrast  Result Date: 06/27/2019 CLINICAL DATA:  Initial evaluation for acute stroke, slurred speech. EXAM: MRI HEAD WITHOUT CONTRAST MRA HEAD  WITHOUT CONTRAST TECHNIQUE: Multiplanar, multiecho pulse sequences of the brain and surrounding structures were obtained without intravenous contrast. Angiographic images of the head were obtained using MRA technique without contrast. COMPARISON:  Prior CT from 06/26/2019. FINDINGS: MRI HEAD FINDINGS Brain: Diffuse prominence of the CSF containing spaces compatible generalized age-related cerebral atrophy. Patchy and confluent T2/FLAIR hyperintensity within the periventricular and deep white matter both cerebral hemispheres most consistent with chronic small vessel ischemic disease, moderate to advanced in nature. Chronic microvascular ischemic changes present within the pons. Multiple superimposed remote lacunar infarcts present within the bilateral basal ganglia and thalami. Few small bilateral cerebellar infarcts noted. Encephalomalacia and gliosis within the right occipital lobe compatible with chronic right PCA territory infarct. Associated mild chronic hemosiderin staining noted within this region. Patchy multifocal areas of restricted diffusion seen involving the parasagittal left frontoparietal region, compatible with acute left ACA  territory infarct. Involvement of the splenium noted. No associated hemorrhage or mass effect. No other evidence for acute or subacute ischemia. Gray-white matter differentiation otherwise maintained. No mass lesion, midline shift or mass effect. No hydrocephalus. No extra-axial fluid collection. Pituitary gland suprasellar region normal. Vascular: Major intracranial vascular flow voids are maintained. Skull and upper cervical spine: Craniocervical junction normal. Upper cervical spine within normal limits. Bone marrow signal intensity normal. No scalp soft tissue abnormality. Sinuses/Orbits: Patient status post ocular lens replacement on the right. Globes and orbital soft tissues demonstrate no acute finding. Paranasal sinuses are clear. Bilateral mastoid effusions noted, left larger than right. Inner ear structures grossly normal. Other: None. MRA HEAD FINDINGS ANTERIOR CIRCULATION: Distal cervical segments of the internal carotid arteries are patent with symmetric antegrade flow. Petrous segments widely patent bilaterally. Atheromatous irregularity within the cavernous/supraclinoid ICAs without hemodynamically significant stenosis. ICA termini well perfused. A1 segments patent bilaterally. Left A1 hypoplastic, accounting for the slightly diminutive left ICA is compared to the right. Anterior cerebral arteries widely patent to their distal aspects without stenosis. M1 segments widely patent bilaterally. Negative MCA bifurcations. Distal MCA branches well perfused and symmetric. POSTERIOR CIRCULATION: Vertebral arteries patent to the vertebrobasilar junction without stenosis. Left vertebral artery dominant. Patent right PICA. Left PICA not seen. Basilar patent to its distal aspect without stenosis. Dominant left anterior inferior cerebral artery. Superior cerebral arteries patent bilaterally. Both of the posterior cerebral arteries are well perfused to their distal aspects. Mild distal small vessel atheromatous  irregularity noted throughout the intracranial circulation. IMPRESSION: MRI HEAD IMPRESSION: 1. Patchy acute ischemic nonhemorrhagic left ACA territory infarcts involving the left parasagittal frontoparietal region. No associated mass effect. 2. Chronic right PCA territory infarct involving the right occipital lobe. 3. Underlying age-related cerebral atrophy with moderate chronic microvascular ischemic disease, with multiple additional remote lacunar infarcts involving the bilateral basal ganglia, thalami, and cerebellum. MRA HEAD IMPRESSION: 1. Negative intracranial MRA for large vessel occlusion. No hemodynamically significant or proximal correctable stenosis. 2. Distal small vessel atheromatous irregularity throughout the intracranial circulation. Electronically Signed   By: Jeannine Boga M.D.   On: 06/27/2019 03:23   Mr Angio Neck W Wo Contrast  Result Date: 06/27/2019 CLINICAL DATA:  Follow-up acute stroke with slurred speech. Left anterior cerebral artery territory infarctions. EXAM: MRA NECK WITHOUT AND WITH CONTRAST TECHNIQUE: Multiplanar and multiecho pulse sequences of the neck were obtained without and with intravenous contrast. Angiographic images of the neck were obtained using MRA technique without and with intravenous contrast. CONTRAST:  9 cc Gadavist COMPARISON:  MRI studies earlier same day. FINDINGS: Aorta appears normal. Branching pattern of  the brachiocephalic vessels appears normal without origin stenosis. Both common carotid arteries are widely patent to their respective bifurcation. The right proximal internal carotid artery shows 50% stenosis but is widely patent beyond that through the skull base to the circle-of-Willis. Proximal left internal carotid artery shows 50% stenosis and is patent beyond that to the circle-of-Willis. Both vertebral arteries are patent with the left being dominant. Vertebral artery origins are not accurately evaluated given chest motion. IMPRESSION: 50%  stenoses at both proximal internal carotid arteries. Internal carotid arteries widely patent beyond that. No posterior circulation pathology evident. Electronically Signed   By: Nelson Chimes M.D.   On: 06/27/2019 16:42   Mr Brain Wo Contrast  Result Date: 06/27/2019 CLINICAL DATA:  Initial evaluation for acute stroke, slurred speech. EXAM: MRI HEAD WITHOUT CONTRAST MRA HEAD WITHOUT CONTRAST TECHNIQUE: Multiplanar, multiecho pulse sequences of the brain and surrounding structures were obtained without intravenous contrast. Angiographic images of the head were obtained using MRA technique without contrast. COMPARISON:  Prior CT from 06/26/2019. FINDINGS: MRI HEAD FINDINGS Brain: Diffuse prominence of the CSF containing spaces compatible generalized age-related cerebral atrophy. Patchy and confluent T2/FLAIR hyperintensity within the periventricular and deep white matter both cerebral hemispheres most consistent with chronic small vessel ischemic disease, moderate to advanced in nature. Chronic microvascular ischemic changes present within the pons. Multiple superimposed remote lacunar infarcts present within the bilateral basal ganglia and thalami. Few small bilateral cerebellar infarcts noted. Encephalomalacia and gliosis within the right occipital lobe compatible with chronic right PCA territory infarct. Associated mild chronic hemosiderin staining noted within this region. Patchy multifocal areas of restricted diffusion seen involving the parasagittal left frontoparietal region, compatible with acute left ACA territory infarct. Involvement of the splenium noted. No associated hemorrhage or mass effect. No other evidence for acute or subacute ischemia. Gray-white matter differentiation otherwise maintained. No mass lesion, midline shift or mass effect. No hydrocephalus. No extra-axial fluid collection. Pituitary gland suprasellar region normal. Vascular: Major intracranial vascular flow voids are maintained.  Skull and upper cervical spine: Craniocervical junction normal. Upper cervical spine within normal limits. Bone marrow signal intensity normal. No scalp soft tissue abnormality. Sinuses/Orbits: Patient status post ocular lens replacement on the right. Globes and orbital soft tissues demonstrate no acute finding. Paranasal sinuses are clear. Bilateral mastoid effusions noted, left larger than right. Inner ear structures grossly normal. Other: None. MRA HEAD FINDINGS ANTERIOR CIRCULATION: Distal cervical segments of the internal carotid arteries are patent with symmetric antegrade flow. Petrous segments widely patent bilaterally. Atheromatous irregularity within the cavernous/supraclinoid ICAs without hemodynamically significant stenosis. ICA termini well perfused. A1 segments patent bilaterally. Left A1 hypoplastic, accounting for the slightly diminutive left ICA is compared to the right. Anterior cerebral arteries widely patent to their distal aspects without stenosis. M1 segments widely patent bilaterally. Negative MCA bifurcations. Distal MCA branches well perfused and symmetric. POSTERIOR CIRCULATION: Vertebral arteries patent to the vertebrobasilar junction without stenosis. Left vertebral artery dominant. Patent right PICA. Left PICA not seen. Basilar patent to its distal aspect without stenosis. Dominant left anterior inferior cerebral artery. Superior cerebral arteries patent bilaterally. Both of the posterior cerebral arteries are well perfused to their distal aspects. Mild distal small vessel atheromatous irregularity noted throughout the intracranial circulation. IMPRESSION: MRI HEAD IMPRESSION: 1. Patchy acute ischemic nonhemorrhagic left ACA territory infarcts involving the left parasagittal frontoparietal region. No associated mass effect. 2. Chronic right PCA territory infarct involving the right occipital lobe. 3. Underlying age-related cerebral atrophy with moderate chronic microvascular ischemic  disease,  with multiple additional remote lacunar infarcts involving the bilateral basal ganglia, thalami, and cerebellum. MRA HEAD IMPRESSION: 1. Negative intracranial MRA for large vessel occlusion. No hemodynamically significant or proximal correctable stenosis. 2. Distal small vessel atheromatous irregularity throughout the intracranial circulation. Electronically Signed   By: Jeannine Boga M.D.   On: 06/27/2019 03:23   Vas US Carotid (at [place] Only)  Result Date: 06/28/2019 Carotid Arterial Duplex Study Indications:       CVA and Speech disturbance. Risk Factors:      Hypertension, hyperlipidemia, Diabetes. Comparison Study:  No prior study on file for comparison. Performing Technologist: Sharion Dove RVS  Examination Guidelines: A complete evaluation includes B-mode imaging, spectral Doppler, color Doppler, and power Doppler as needed of all accessible portions of each vessel. Bilateral testing is considered an integral part of a complete examination. Limited examinations for reoccurring indications may be performed as noted.  Right Carotid Findings: +----------+--------+--------+--------+------------+------------------+             PSV cm/s EDV cm/s Stenosis Describe     Comments            +----------+--------+--------+--------+------------+------------------+  CCA Prox   75       17                             intimal thickening  +----------+--------+--------+--------+------------+------------------+  CCA Distal 70       15                             intimal thickening  +----------+--------+--------+--------+------------+------------------+  ICA Prox   111      27                heterogenous Shadowing           +----------+--------+--------+--------+------------+------------------+  ICA Distal 87       21                                                 +----------+--------+--------+--------+------------+------------------+  ECA        80       7                                                   +----------+--------+--------+--------+------------+------------------+ +----------+--------+-------+--------+-------------------+             PSV cm/s EDV cms Describe Arm Pressure (mmHG)  +----------+--------+-------+--------+-------------------+  Subclavian 81                                             +----------+--------+-------+--------+-------------------+ +---------+--------+--+--------+--+  Vertebral PSV cm/s 83 EDV cm/s 12  +---------+--------+--+--------+--+  Left Carotid Findings: +----------+--------+--------+--------+--------+------------------+             PSV cm/s EDV cm/s Stenosis Describe Comments            +----------+--------+--------+--------+--------+------------------+  CCA Prox   109      16  intimal thickening  +----------+--------+--------+--------+--------+------------------+  CCA Distal 89       12                         intimal thickening  +----------+--------+--------+--------+--------+------------------+  ICA Prox   152      31                calcific                     +----------+--------+--------+--------+--------+------------------+  ICA Distal 116      25                                             +----------+--------+--------+--------+--------+------------------+  ECA        72       9                                              +----------+--------+--------+--------+--------+------------------+ +----------+--------+--------+--------+-------------------+  Subclavian PSV cm/s EDV cm/s Describe Arm Pressure (mmHG)  +----------+--------+--------+--------+-------------------+             96                                              +----------+--------+--------+--------+-------------------+ +---------+--------+--+--------+--+  Vertebral PSV cm/s 50 EDV cm/s 11  +---------+--------+--+--------+--+  Summary: Right Carotid: Velocities in the right ICA are consistent with a 1-39% stenosis. Left Carotid: Velocities in the left ICA are consistent with a  1-39% stenosis. Vertebrals:  Bilateral vertebral arteries demonstrate antegrade flow. Subclavians: Normal flow hemodynamics were seen in bilateral subclavian              arteries. *See table(s) above for measurements and observations.  Electronically signed by Antony Contras MD on 06/28/2019 at 10:55:33 AM.    Final      Subjective:   Patient was seen and examined 06/29/2019, 7:15 AM Patient stable today. No acute distress.  No issues overnight Stable for discharge.  Discharge Exam:    Vitals:   06/28/19 1610 06/28/19 2019 06/29/19 0038 06/29/19 0424  BP: 140/62 (!) 141/67 139/63 (!) 139/56  Pulse: (!) 54 (!) 57 65 (!) 52  Resp: 20     Temp: 98.5 F (36.9 C) 98.6 F (37 C) 98 F (36.7 C) 98.6 F (37 C)  TempSrc: Oral Oral Oral Oral  SpO2: 100% 96% 97% 100%  Weight:      Height:        General: Pt lying comfortably in bed & appears in no obvious distress. Cardiovascular: S1 & S2 heard, RRR, S1/S2 +. No murmurs, rubs, gallops or clicks. No JVD or pedal edema. Respiratory: Clear to auscultation without wheezing, rhonchi or crackles. No increased work of breathing. Abdominal:  Non-distended, non-tender & soft. No organomegaly or masses appreciated. Normal bowel sounds heard. CNS: Alert and oriented. No focal deficits. Extremities: no edema, no cyanosis    The results of significant diagnostics from this hospitalization (including imaging, microbiology, ancillary and laboratory) are listed below for reference.      Microbiology:   Recent Results (from the past 240 hour(s))  SARS CORONAVIRUS 2     Status: None   Collection Time: 06/26/19 11:05 PM  Result Value Ref Range Status   SARS Coronavirus 2 NEGATIVE NEGATIVE Final    Comment: (NOTE) SARS-CoV-2 target nucleic acids are NOT DETECTED. The SARS-CoV-2 RNA is generally detectable in upper and lower respiratory specimens during the acute phase of infection. Negative results do not preclude SARS-CoV-2 infection, do not rule  out co-infections with other pathogens, and should not be used as the sole basis for treatment or other patient management decisions. Negative results must be combined with clinical observations, patient history, and epidemiological information. The expected result is Negative. Fact Sheet for Patients: SugarRoll.be Fact Sheet for Healthcare Providers: https://www.woods-mathews.com/ This test is not yet approved or cleared by the [place] FDA and  has been authorized for detection and/or diagnosis of SARS-CoV-2 by FDA under an Emergency Use Authorization (EUA). This EUA will remain  in effect (meaning this test can be used) for the duration of the COVID-19 declaration under Section 56 4(b)(1) of the Act, 21 U.S.C. section 360bbb-3(b)(1), unless the authorization is terminated or revoked sooner. Performed at [facility], [place] [address] [place], [place] [address]      Labs:   CBC: Recent Labs  Lab 06/26/19 2032 06/27/19 0323  WBC 8.7 8.0  NEUTROABS 4.3  --   HGB 12.8 12.2  HCT 40.8 38.2  MCV 97.1 97.9  PLT 236 937   Basic Metabolic Panel: Recent Labs  Lab 06/26/19 2032 06/27/19 0323 06/28/19 0640 06/29/19 0359  NA 139 140 140 141  K 4.4 4.1 4.3 4.3  CL 106 108 107 108  CO2 21* 20* 22 24  GLUCOSE 172* 170* 178* 168*  BUN 31* 28* 17 19  CREATININE 1.32* 1.11* 0.86 0.97  CALCIUM 10.2 9.7 9.3 9.3   Liver Function Tests: Recent Labs  Lab 06/26/19 2032 06/27/19 0323  AST 19 17  ALT 25 22  ALKPHOS 70 61  BILITOT 0.4 0.2*  PROT 8.0 7.0  ALBUMIN 4.0 3.5   BNP (last 3 results) No results for input(s): BNP in the last 8760 hours. Cardiac Enzymes: No results for input(s): CKTOTAL, CKMB, CKMBINDEX, TROPONINI in the last 168 hours. CBG: Recent Labs  Lab 06/28/19 0640 06/28/19 1130 06/28/19 1646 06/28/19 2114 06/29/19 0608  GLUCAP 167* 195* 135* 257* 166*   Hgb A1c Recent Labs    06/26/19 2032  06/27/19 0323  HGBA1C 7.7* 7.6*   Lipid Profile Recent Labs    06/26/19 2247 06/27/19 0323  CHOL 179 160  HDL 62 56  LDLCALC 99 89  TRIG 88 77  CHOLHDL 2.9 2.9  Urinalysis    Component Value Date/Time   COLORURINE YELLOW 07/05/2013 1206   APPEARANCEUR CLOUDY (A) 07/05/2013 1206   LABSPEC 1.014 07/05/2013 1206   PHURINE 6.0 07/05/2013 1206   GLUCOSEU NEGATIVE 07/05/2013 1206   HGBUR TRACE (A) 07/05/2013 1206   HGBUR negative 08/16/2009 1523   BILIRUBINUR neg 10/15/2018 0941   KETONESUR NEGATIVE 07/05/2013 1206   PROTEINUR Positive (A) 10/15/2018 0941   PROTEINUR NEGATIVE 07/05/2013 1206   UROBILINOGEN 0.2 10/15/2018 0941   UROBILINOGEN 0.2 07/05/2013 1206   NITRITE neg 10/15/2018 0941   NITRITE NEGATIVE 07/05/2013 1206   LEUKOCYTESUR Moderate (2+) (A) 10/15/2018 0941    Time coordinating discharge: Over 40 minutes  SIGNED: Deatra James, MD, FACP, FHM. [facility],  Pager [phone]  If 7PM-7AM, please contact night-coverage [email or web address], Password [facility] 06/29/2019, 7:15 AM

## 2019-06-29 NOTE — Evaluation (Signed)
Speech Language Pathology Evaluation Patient Details Name: [name] MRN: [id number] DOB: Oct 19, 1946 Today's Date: 06/29/2019 Time: 1050-1120 SLP Time Calculation (min) (ACUTE ONLY): 30 min  Problem List:  Patient Active Problem List   Diagnosis Date Noted  . CVA (cerebral vascular accident) ([place]) 06/28/2019  . Acute cerebrovascular accident (CVA) ([place]) 06/26/2019  . Low back pain 11/29/2017  . Syncope 11/03/2015  . Diabetes mellitus without complication ([place]) 61/60/7371  . 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 Essential hypertension 07/08/2007   Past Medical History:  Past Medical History:  Diagnosis Date  . Acute bronchitis 07/22/2008  . Diabetes mellitus without complication ([place]) 0/62/6948  . 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 Hypertension    Past Surgical History:  Past Surgical History:  Procedure Laterality Date  . EYE SURGERY Right 11/2018   implant  . 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 NECK SURGERY     Remove a tumor  . TYMPANOSTOMY     Dr. 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 PMH includes HTN and DM.  Passed [facility] swallow screen.   Assessment / Plan / Recommendation Clinical Impression  Pt presents with mild dysarthria.  Although pt was largely intelligible in conversation, she notes that her speech sounds different from baseline and this is concerning to her.  Reviewed dysarthria strategies to improve intelligibility.  Pt would like to receive [place] ST to continue to work on improving speech to baseline level of function.  Recommend ST to address dysarthria at next level of care.  Pt's cognitive linguistic function was assessed with the COGNISTAT (see below), although there were mild impairments noted in several areas, many of these deficits can be attributed to an attention deficit.  Pt noted that nothing was  more difficult than anticipated and that she needs to "concentrate" on what she is doing. Pt states that she finds it easier to do things for herself at home.  Here at [facility], everyone wants her to use a crutch (note: this is a metaphorical crutch and not related specifically to mobility).  Pt is not concerned with safety with ADLs at home and was able to name several strategies she can use to improve accuracy and has equipment at home she can use to improve safety and mobility.  Of note, pt performed within the average range on higher level reasoning tasks.  Recommend reassessment in home environment to see if she has cognition needs for ST.  COGNISTAT  - All subtests are within the average range, except where otherwise specified Orientation: 11/12 Attention: 5/8, Mild impairment Comprehension: 4/6, Mild impairment Repetition: 8/12, Mild impairment Naming: 8/8 Construction: Not assessed Memory: 6/8, Moderate impairment  (Registration 2 trials) Calculations: 2/4, Mild imparirment Similarities: 7/8 Judgement: 4/6    SLP Assessment  SLP Recommendation/Assessment: All further Speech Lanaguage Pathology  needs can be addressed in the next venue of care SLP Visit Diagnosis: Dysarthria and anarthria (R47.1);Attention and concentration deficit Attention and concentration deficit following: Cerebral infarction    Follow Up Recommendations  Home health SLP    Frequency and Duration           SLP Evaluation Cognition  Overall Cognitive Status: No family/caregiver present to determine baseline cognitive functioning Orientation Level: Oriented X4 Attention: Sustained Sustained Attention: Impaired Sustained Attention Impairment: Verbal basic Memory: Impaired Memory Impairment: Decreased short term memory;Retrieval deficit Decreased Short  Term Memory: Verbal basic Problem Solving: Appears intact Executive Function: Reasoning Reasoning: Appears intact Sequencing: Appears intact        Comprehension  Auditory Comprehension Overall Auditory Comprehension: Impaired Commands: Impaired One Step Basic Commands: 75-100% accurate Multistep Basic Commands: 50-74% accurate Interfering Components: Attention EffectiveTechniques: Repetition    Expression Expression Primary Mode of Expression: Verbal Verbal Expression Overall Verbal Expression: Appears within functional limits for tasks assessed Repetition: Impaired Level of Impairment: Sentence level Naming: No impairment Pragmatics: No impairment Interfering Components: Attention   Oral / Motor  Motor Speech Overall Motor Speech: Impaired Phonation: (Harsh) Resonance: Within functional limits Articulation: Impaired Level of Impairment: Sentence Intelligibility: Intelligibility reduced Sentence: 75-100% accurate Conversation: 75-100% accurate   [place], MA, [place] Office: [phone]; Pager [phone]): [phone]  06/29/2019, 11:40 AM

## 2019-06-29 NOTE — TOC Initial Note (Signed)
Transition of Care [facility]) - Initial/Assessment Note    Patient Details  Name: [name] MRN: [id number] Date of Birth: 1946-08-07  Transition [facility]) CM/SW Contact:    Pollie Friar, RN Phone Number: 06/29/2019, 2:38 PM  Clinical Narrative:                   Expected Discharge Plan: [place] Services Barriers to Discharge: No Barriers Identified   Patient Goals and CMS Choice   CMS Medicare.gov Compare Post Acute Care list provided to:: Patient Choice offered to / list presented to : Patient  Expected Discharge Plan and Services Expected Discharge Plan: [place]   Discharge Planning Services: CM Consult Post Acute Care Choice: [place] arrangements for the past 2 months: Single Family Home Expected Discharge Date: 06/29/19                         HH Arranged: RN, PT, OT, Nurse's Aide, Speech Therapy, Social Work [employer] Agency: [place] ([place]) Date [place]: 06/29/19   Representative spoke with at [place]: Butch Penny  Prior Living Arrangements/Services Living arrangements for the past 2 months: [place] Lives with:: Adult Children Patient language and need for interpreter reviewed:: Yes(no needs) Do you feel safe going back to the place where you live?: Yes      Need for Family Participation in Patient Care: Yes (Comment)(24 hour supervision recommended) Care giver support system in place?: Yes (comment)(pt states her son is able to provide needed supervision) Current home services: DME(walker, shower seat, cane) Criminal Activity/Legal Involvement Pertinent to Current Situation/Hospitalization: No - Comment as needed  Activities of Daily Living Home Assistive Devices/Equipment: None ADL Screening (condition at time of admission) Patient's cognitive ability adequate to safely complete daily activities?: Yes Is the patient deaf or have difficulty hearing?: No Does the patient have  difficulty seeing, even when wearing glasses/contacts?: No Does the patient have difficulty concentrating, remembering, or making decisions?: No Patient able to express need for assistance with ADLs?: Yes Does the patient have difficulty dressing or bathing?: No Independently performs ADLs?: Yes (appropriate for developmental age) Does the patient have difficulty walking or climbing stairs?: No Weakness of Legs: ("sometimes") Weakness of Arms/Hands: None  Permission Sought/Granted                  Emotional Assessment Appearance:: Appears stated age Attitude/Demeanor/Rapport: Engaged Affect (typically observed): Accepting, Pleasant Orientation: : Oriented to Self, Oriented to Place, Oriented to  Time, Oriented to Situation   Psych Involvement: No (comment)  Admission diagnosis:  Dysarthria [R47.1] Non-insulin dependent type 2 diabetes mellitus ([place]) [E11.9] Essential hypertension [I10] Ischemic stroke ([place]) [I63.9] Hyperlipidemia, unspecified hyperlipidemia type [E78.5] Patient Active Problem List   Diagnosis Date Noted  . CVA (cerebral vascular accident) ([place]) 06/28/2019  . Acute cerebrovascular accident (CVA) ([place]) 06/26/2019  . Low back pain 11/29/2017  . Syncope 11/03/2015  . Diabetes mellitus without complication ([place]) 73/22/0254  . 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 Essential hypertension 07/08/2007   PCP:  Laurey Morale, MD Pharmacy:   [facility] [address] [place] [address] Phone: [phone] Fax: [phone]     Social Determinants of Health (SDOH) Interventions    Readmission Risk Interventions No flowsheet data found.

## 2019-06-29 NOTE — Progress Notes (Signed)
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 PMH includes HTN and DM.    OT comments  Patient supine in bed and eager to eat breakfast this morning.  Assisted OOB with min assist, transferred to [facility] then recliner with min guard assist given min-mod cueing for problem solving, safety awareness, and RW mgmt. Completed short blessed test on pt, scoring 8/28 with significant deficits in short term memory with noted difficulties in sequencing.  She reports her son will be staying with her, and will have her daughters assist during the day.  Continue to recommend 24/7 support, and HHOT services (if 24/7 is not available, will need SNF).    Follow Up Recommendations  Home health OT;Supervision/Assistance - 24 hour(SNF if does not have 24/7 support)    Equipment Recommendations  None recommended by OT(has BSC at home already)    Recommendations for Other Services      Precautions / Restrictions Precautions Precautions: Fall Restrictions Weight Bearing Restrictions: No       Mobility Bed Mobility Overal bed mobility: Needs Assistance Bed Mobility: Supine to Sit     Supine to sit: Min assist     General bed mobility comments: for scooting L hip forward   Transfers Overall transfer level: Needs assistance Equipment used: Rolling walker (2 wheeled) Transfers: Sit to/from Stand Sit to Stand: Min guard         General transfer comment: Min guard for stability. Cues for safe hand placement.     Balance Overall balance assessment: Needs assistance Sitting-balance support: No upper extremity supported;Feet supported Sitting balance-Leahy Scale: Good     Standing balance support: Bilateral upper extremity  supported;During functional activity Standing balance-Leahy Scale: Poor Standing balance comment: Reliant on UE and external support                            ADL either performed or assessed with clinical judgement   ADL Overall ADL's : Needs assistance/impaired                         Toilet Transfer: Min guard;Ambulation;RW;BSC   Toileting- Water quality scientist and Hygiene: Min guard;Sit to/from stand       Functional mobility during ADLs: Min guard;Rolling walker;Cueing for safety       Vision   Vision Assessment?: No apparent visual deficits   Perception     Praxis      Cognition Arousal/Alertness: Awake/alert Behavior During Therapy: WFL for tasks assessed/performed Overall Cognitive Status: No family/caregiver present to determine baseline cognitive functioning                                 General Comments: pt presenting with poor safety awareness, problem sovling and STM.  Completed short blessed test revealing questionable impairment (8/28).  min cueing for problem sovling and safety using RW for basic transfers in room         Exercises     Shoulder Instructions       General Comments reviewed pt safety and recommendations for home support, reports she will have 24/7 support from son and daughter    Pertinent Vitals/ Pain  Pain Assessment: No/denies pain  Home Living                                          Prior Functioning/Environment              Frequency  Min 2X/week        Progress Toward Goals  OT Goals(current goals can now be found in the care plan section)  Progress towards OT goals: Progressing toward goals  Acute Rehab OT Goals Patient Stated Goal: to go home OT Goal Formulation: With patient  Plan Discharge plan remains appropriate;Frequency remains appropriate    Co-evaluation                 AM-PAC OT "6 Clicks" Daily Activity     Outcome  Measure   Help from another person eating meals?: None Help from another person taking care of personal grooming?: A Little Help from another person toileting, which includes using toliet, bedpan, or urinal?: A Little Help from another person bathing (including washing, rinsing, drying)?: A Little Help from another person to put on and taking off regular upper body clothing?: A Little Help from another person to put on and taking off regular lower body clothing?: A Lot 6 Click Score: 18    End of Session Equipment Utilized During Treatment: Rolling walker  OT Visit Diagnosis: Unsteadiness on feet (R26.81);Muscle weakness (generalized) (M62.81)   Activity Tolerance Patient tolerated treatment well   Patient Left in chair;with call bell/phone within reach;with chair alarm set   Nurse Communication Mobility status        Time: 7106-2694 OT Time Calculation (min): 24 min  Charges: OT General Charges $OT Visit: 1 Visit OT Treatments $Self Care/Home Management : 23-37 mins  Delight Stare, OT Acute Rehabilitation Services Pager [phone] Office [phone]    Delight Stare 06/29/2019, 8:20 AM

## 2019-06-29 NOTE — TOC Transition Note (Signed)
Transition of Care [facility]) - CM/SW Discharge Note   Patient Details  Name: [name] MRN: [id number] Date of Birth: 10/27/1946  Transition of Care [facility]) CM/SW Contact:  Pollie Friar, RN Phone Number: 06/29/2019, 2:38 PM   Clinical Narrative:    Pt discharging home with [facility] services. Butch Penny with [facility] accepted the referral.  Pt has PCP, insurance and transportation home.   Final next level of care: Home w Home Health Services Barriers to Discharge: No Barriers Identified   Patient Goals and CMS Choice   CMS Medicare.gov Compare Post Acute Care list provided to:: Patient Choice offered to / list presented to : Patient  Discharge Placement                       Discharge Plan and Services   Discharge Planning Services: CM Consult Post Acute Care Choice: Home Health                    HH Arranged: RN, PT, OT, Nurse's Aide, Speech Therapy, Social Work [employer] Agency: [place] ([place]) Date [place]: 06/29/19   Representative spoke with at [place]: [place] ([place]) Interventions     Readmission Risk Interventions No flowsheet data found.

## 2019-06-29 NOTE — Discharge Summary (Signed)
Physician Discharge Summary [facility]    Patient: [name]                   Admit date: 06/26/2019   DOB: Apr 16, 1946             Discharge date:06/29/2019/1:41 PM IRS:[id number]                          PCP: Laurey Morale, MD  Disposition: Home with Home health   Recommendations for Outpatient Follow-up:    Follow up: PCP and [facility] in 1-2 weeks   Discharge Condition: Stable   Code Status:   Code Status: Full Code  Diet recommendation: Diabetic diet   Discharge Diagnoses:    Principal Problem:   Acute cerebrovascular accident (CVA) ([place]) Active Problems:   Diabetes mellitus without complication ([place])   HYPERLIPIDEMIA, MIXED   Essential hypertension   CVA (cerebral vascular accident) [facility])   History of Present Illness/ Hospital Course [name]:   Pamela Mage Brownis a 73 y.o.femalewith medical history significant ofdiabetes, hypertension, hyperlipidemia, who was brought in by daughter secondary to slurred speech. Apparently she woke up early this morning and was not able to speak for several hours. Speech is still not back to normal. Daughter noted that she was not sounding like herself. She try to walk but was feeling weak and could not walk normally. She has not had this symptoms before. She has longstanding hypertension with diabetes. Patient has not been compliant according to daughter not checking have blood sugar regularly. As the day goes to symptoms have gradually improved but not back to normal. Patient was therefore brought to the emergency room. Patient has had mild bilateral edema otherwise has been usually active and moving around. In the ER head CT showed multiple lacunar infarcts and possibly subacute infarcts. She is being admitted for CVA work-up..  ED Course:Temperature is 98.6 blood pressure 167/84 pulse 86 respiratory rate of 18 oxygen sat 96% room air.Sodium is 139 potassium 4.4 CO2 21 glucose 172. Creatinine  is 1.32 with BUN of 31. CBC entirely within normal. PT/INR within normal. Head CT showed prior infarct in the medial right occipital lobe. Also lacunar infarct in the left thalamus internal capsule noted. No acute CVA finding. Patient is therefore being admitted for MRI and further testing.   Discharge summery/Assessment & Plan:   Principal Problem:   Acute cerebrovascular accident (CVA) ([name]) Active Problems:   Diabetes mellitus without complication ([place])   HYPERLIPIDEMIA, MIXED   Essential hypertension  Acute CVA: -Stable this morning negative any new focal neurological finding per patient speech still not normal Head CT showed no acute findings but subacute findings. MRA/MRI head:  Patchy acute ischemic nonhemorrhagic left ACA territory infarcts involving the left parasagittal frontoparietal region. No associated mass effect. -. Chronic right PCA territory infarct involving the right occipital lobe.  -CT of the head revealing prior stroke; medial right occipital lobe. Prior small lacunar infarct in the left thalamus. Lacunar infarcts  MRA of the neck: 50% stenoses at both proximal internal carotid arteries. Internal carotid arteries widely patent beyond that. No posterior circulation pathology evident. Pending Echo:  Reviewed within normal limits as below   -Status post evaluation from PT OT and speech evaluation.  Neurology was consulted, spoke to neurology on call Dr. Cheral Marker in detail regarding care of patient appreciate his recommendations.  -Initiate aspirin and Plavix, continue statins, LDL at goal 56   Diabetes:Resuming  home medication of metformin A1c 7.6 -Strict diabetic diet recommended  Hypertension:Blood pressure control. Continue current medication with no changes.  hyperlipidemia: Continue with statin. Total cholesterol 160, HDL 56, LDL 89, triglycerides 77  Bilateral lower extremity edema: Probably stasis edema.  Nonpitting.    Echocardiogram to be done and will monitor.   Code Status:Full code Family Communication: patient's daughter was informed over the phone.    The findings was currently discussed with the patient in detail. Disposition Plan:Home  with [facility]  Consults called:Neurology Dr. Malen Gauze Admission status:Inpatient.    2 D- Echo -IMPRESSIONS   1. The left ventricle has hyperdynamic systolic function, with an ejection fraction of >65%. The cavity size was normal. There is severely increased left ventricular wall thickness. Left ventricular diastolic Doppler parameters are consistent with  impaired relaxation.  2. The right ventricle has normal systolic function. The cavity was normal. There is no increase in right ventricular wall thickness.  3. The aortic valve is tricuspid. Severely thickening of the aortic valve. Severe calcifcation of the aortic valve. Mild stenosis of the aortic valve. Severe aortic annular calcification noted.  4. The mitral valve is abnormal. Moderate thickening of the mitral valve leaflet. Moderate calcification of the mitral valve leaflet. There is moderate mitral annular calcification present. No evidence of mitral valve stenosis.  5. The aorta is normal in size and structure.  6. The aortic root is normal in size and structure.  7. Pulmonary hypertension is indeterminant, inadequate TR jet.    Discharge Instructions:   Discharge Instructions    Activity as tolerated - No restrictions   Complete by: As directed    Activity as tolerated - No restrictions   Complete by: As directed    Activity as tolerated - No restrictions   Complete by: As directed    Call MD for:  difficulty breathing, headache or visual disturbances   Complete by: As directed    Call MD for:  temperature >100.4   Complete by: As directed    Diet - low sodium heart healthy   Complete by: As directed    Discharge instructions   Complete by: As directed    Follow-up with PCP in 1 week.   Avoid any sedative medication before notifying family or PCP such as Benadryl sleeping medication.   Discharge instructions   Complete by: As directed    Cont. With ASA and Plavix X 3 wks then Plavix only daily   Increase activity slowly   Complete by: As directed    Increase activity slowly   Complete by: As directed    Increase activity slowly   Complete by: As directed        Medication List    TAKE these medications   acetaminophen 325 MG tablet Commonly known as: TYLENOL Take 650 mg by mouth every 6 (six) hours as needed for headache (pain).   Alcohol Prep Pads Use to test 1 X day and diagnosis code is E 11.9   amLODipine 10 MG tablet Commonly known as: NORVASC TAKE 1 TABLET BY MOUTH ONCE DAILY Notes to patient: 06/30/2019   aspirin 81 MG chewable tablet Chew 1 tablet (81 mg total) by mouth daily. Notes to patient: 06/30/2019   BENADRYL EX Apply 1 application topically 2 (two) times daily as needed (itching).   clopidogrel 75 MG tablet Commonly known as: PLAVIX Take 1 tablet (75 mg total) by mouth daily. Notes to patient: 06/30/2019   diclofenac 75 MG EC tablet Commonly known as:  VOLTAREN Take 1 tablet by mouth twice daily Notes to patient: 06/29/2019   glipiZIDE 10 MG tablet Commonly known as: GLUCOTROL TAKE 1 TABLET BY MOUTH TWICE DAILY BEFORE  A  MEAL What changed: See the new instructions. Notes to patient: 06/29/2019   glucose blood test strip Commonly known as: OneTouch Verio Test 1 x day and diagnosis code is E 11.9   lisinopril 10 MG tablet Commonly known as: ZESTRIL Take 2 tablets (20 mg total) by mouth daily. Notes to patient: 06/30/2019   metFORMIN 500 MG tablet Commonly known as: GLUCOPHAGE TAKE 1 TABLET BY MOUTH TWICE DAILY WITH MEALS Notes to patient: 06/29/2019   metoprolol succinate 50 MG 24 hr tablet Commonly known as: TOPROL-XL Take 1 tablet (50 mg total) by mouth daily. Take with or immediately following a meal. Notes to patient:  06/30/2019   onetouch ultrasoft lancets Test 1 X day and diagnosis code is E 11.9   pioglitazone 30 MG tablet Commonly known as: ACTOS Take 1 tablet by mouth once daily Notes to patient: 06/30/2019   rosuvastatin 40 MG tablet Commonly known as: CRESTOR Take one tablet by mouth daily. Patient must have an OV for further refills. What changed:   how much to take  how to take this  when to take this  additional instructions Notes to patient: 06/30/2019   spironolactone 25 MG tablet Commonly known as: ALDACTONE Take 1 tablet by mouth once daily Notes to patient: 06/30/2019       Allergies  Allergen Reactions   Hydromet [Hydrocodone-Homatropine] Itching     Procedures /Studies:   Ct Head Wo Contrast  Result Date: 06/26/2019 CLINICAL DATA:  Slurred speech EXAM: CT HEAD WITHOUT CONTRAST TECHNIQUE: Contiguous axial images were obtained from the base of the skull through the vertex without intravenous contrast. COMPARISON:  None. FINDINGS: Brain: The ventricles and sulci are within normal limits for age. There is no intracranial mass, hemorrhage, extra-axial fluid collection, or midline shift. There is evidence of a prior appearing infarct in the medial right occipital lobe. There is evidence of a prior lacunar type infarct in the left thalamus as well as in the genu of the left internal capsule. There is small vessel disease in the centra semiovale bilaterally with patchy age uncertain white matter infarcts in these areas. There is small vessel disease in the anterior and posterior limbs of the right internal capsule. Vascular: No hyperdense vessel evident. There is calcification in each distal vertebral artery and in the carotid siphon regions. Skull: The bony calvarium appears intact. Sinuses/Orbits: There is mucosal thickening in several ethmoid air cells. Other visualized paranasal sinuses are clear. Orbits appear symmetric bilaterally except for previous cataract removal on the right.  Other: There is extensive mastoid air cell opacification on the left. There is opacification of several inferior mastoid air cells on the right. IMPRESSION: 1. Prior infarct in the medial right occipital lobe. Prior small lacunar infarct in the left thalamus. Lacunar infarcts in each internal capsule region noted. 2. Small vessel disease in the centra semiovale bilaterally. There are several age uncertain lacunar type infarcts in the periventricular white matter. An acute infarct in the centra semiovale cannot be excluded. 3.  No evident mass or hemorrhage. 4. 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 Inferior mastoid air cell disease on the right. 5.  Foci of arterial vascular calcification noted. 6.  Mucosal thickening in several ethmoid air cells. Electronically Signed   By: Lowella Grip III M.D.   On:  06/26/2019 21:00   Mr Angio Head Wo Contrast  Result Date: 06/27/2019 CLINICAL DATA:  Initial evaluation for acute stroke, slurred speech. EXAM: MRI HEAD WITHOUT CONTRAST MRA HEAD WITHOUT CONTRAST TECHNIQUE: Multiplanar, multiecho pulse sequences of the brain and surrounding structures were obtained without intravenous contrast. Angiographic images of the head were obtained using MRA technique without contrast. COMPARISON:  Prior CT from 06/26/2019. FINDINGS: MRI HEAD FINDINGS Brain: Diffuse prominence of the CSF containing spaces compatible generalized age-related cerebral atrophy. Patchy and confluent T2/FLAIR hyperintensity within the periventricular and deep white matter both cerebral hemispheres most consistent with chronic small vessel ischemic disease, moderate to advanced in nature. Chronic microvascular ischemic changes present within the pons. Multiple superimposed remote lacunar infarcts present within the bilateral basal ganglia and thalami. Few small bilateral cerebellar infarcts noted. Encephalomalacia and gliosis within the right occipital lobe compatible with chronic right PCA  territory infarct. Associated mild chronic hemosiderin staining noted within this region. Patchy multifocal areas of restricted diffusion seen involving the parasagittal left frontoparietal region, compatible with acute left ACA territory infarct. Involvement of the splenium noted. No associated hemorrhage or mass effect. No other evidence for acute or subacute ischemia. Gray-white matter differentiation otherwise maintained. No mass lesion, midline shift or mass effect. No hydrocephalus. No extra-axial fluid collection. Pituitary gland suprasellar region normal. Vascular: Major intracranial vascular flow voids are maintained. Skull and upper cervical spine: Craniocervical junction normal. Upper cervical spine within normal limits. Bone marrow signal intensity normal. No scalp soft tissue abnormality. Sinuses/Orbits: Patient status post ocular lens replacement on the right. Globes and orbital soft tissues demonstrate no acute finding. Paranasal sinuses are clear. Bilateral mastoid effusions noted, left larger than right. Inner ear structures grossly normal. Other: None. MRA HEAD FINDINGS ANTERIOR CIRCULATION: Distal cervical segments of the internal carotid arteries are patent with symmetric antegrade flow. Petrous segments widely patent bilaterally. Atheromatous irregularity within the cavernous/supraclinoid ICAs without hemodynamically significant stenosis. ICA termini well perfused. A1 segments patent bilaterally. Left A1 hypoplastic, accounting for the slightly diminutive left ICA is compared to the right. Anterior cerebral arteries widely patent to their distal aspects without stenosis. M1 segments widely patent bilaterally. Negative MCA bifurcations. Distal MCA branches well perfused and symmetric. POSTERIOR CIRCULATION: Vertebral arteries patent to the vertebrobasilar junction without stenosis. Left vertebral artery dominant. Patent right PICA. Left PICA not seen. Basilar patent to its distal aspect without  stenosis. Dominant left anterior inferior cerebral artery. Superior cerebral arteries patent bilaterally. Both of the posterior cerebral arteries are well perfused to their distal aspects. Mild distal small vessel atheromatous irregularity noted throughout the intracranial circulation. IMPRESSION: MRI HEAD IMPRESSION: 1. Patchy acute ischemic nonhemorrhagic left ACA territory infarcts involving the left parasagittal frontoparietal region. No associated mass effect. 2. Chronic right PCA territory infarct involving the right occipital lobe. 3. Underlying age-related cerebral atrophy with moderate chronic microvascular ischemic disease, with multiple additional remote lacunar infarcts involving the bilateral basal ganglia, thalami, and cerebellum. MRA HEAD IMPRESSION: 1. Negative intracranial MRA for large vessel occlusion. No hemodynamically significant or proximal correctable stenosis. 2. Distal small vessel atheromatous irregularity throughout the intracranial circulation. Electronically Signed   By: Jeannine Boga M.D.   On: 06/27/2019 03:23   Mr Angio Neck W Wo Contrast  Result Date: 06/27/2019 CLINICAL DATA:  Follow-up acute stroke with slurred speech. Left anterior cerebral artery territory infarctions. EXAM: MRA NECK WITHOUT AND WITH CONTRAST TECHNIQUE: Multiplanar and multiecho pulse sequences of the neck were obtained without and with intravenous contrast. Angiographic images of the  neck were obtained using MRA technique without and with intravenous contrast. CONTRAST:  9 cc Gadavist COMPARISON:  MRI studies earlier same day. FINDINGS: Aorta appears normal. Branching pattern of the brachiocephalic vessels appears normal without origin stenosis. Both common carotid arteries are widely patent to their respective bifurcation. The right proximal internal carotid artery shows 50% stenosis but is widely patent beyond that through the skull base to the circle-of-Willis. Proximal left internal carotid artery  shows 50% stenosis and is patent beyond that to the circle-of-Willis. Both vertebral arteries are patent with the left being dominant. Vertebral artery origins are not accurately evaluated given chest motion. 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 Internal carotid arteries widely patent beyond that. No posterior circulation pathology evident. Electronically Signed   By: Nelson Chimes M.D.   On: 06/27/2019 16:42   Mr Brain Wo Contrast  Result Date: 06/27/2019 CLINICAL DATA:  Initial evaluation for acute stroke, slurred speech. EXAM: MRI HEAD WITHOUT CONTRAST MRA HEAD WITHOUT CONTRAST TECHNIQUE: Multiplanar, multiecho pulse sequences of the brain and surrounding structures were obtained without intravenous contrast. Angiographic images of the head were obtained using MRA technique without contrast. COMPARISON:  Prior CT from 06/26/2019. FINDINGS: MRI HEAD FINDINGS Brain: Diffuse prominence of the CSF containing spaces compatible generalized age-related cerebral atrophy. Patchy and confluent T2/FLAIR hyperintensity within the periventricular and deep white matter both cerebral hemispheres most consistent with chronic small vessel ischemic disease, moderate to advanced in nature. Chronic microvascular ischemic changes present within the pons. Multiple superimposed remote lacunar infarcts present within the bilateral basal ganglia and thalami. Few small bilateral cerebellar infarcts noted. Encephalomalacia and gliosis within the right occipital lobe compatible with chronic right PCA territory infarct. Associated mild chronic hemosiderin staining noted within this region. Patchy multifocal areas of restricted diffusion seen involving the parasagittal left frontoparietal region, compatible with acute left ACA territory infarct. Involvement of the splenium noted. No associated hemorrhage or mass effect. No other evidence for acute or subacute ischemia. Gray-white matter differentiation  otherwise maintained. No mass lesion, midline shift or mass effect. No hydrocephalus. No extra-axial fluid collection. Pituitary gland suprasellar region normal. Vascular: Major intracranial vascular flow voids are maintained. Skull and upper cervical spine: Craniocervical junction normal. Upper cervical spine within normal limits. Bone marrow signal intensity normal. No scalp soft tissue abnormality. Sinuses/Orbits: Patient status post ocular lens replacement on the right. Globes and orbital soft tissues demonstrate no acute finding. Paranasal sinuses are clear. Bilateral mastoid effusions noted, left larger than right. Inner ear structures grossly normal. Other: None. MRA HEAD FINDINGS ANTERIOR CIRCULATION: Distal cervical segments of the internal carotid arteries are patent with symmetric antegrade flow. Petrous segments widely patent bilaterally. Atheromatous irregularity within the cavernous/supraclinoid ICAs without hemodynamically significant stenosis. ICA termini well perfused. A1 segments patent bilaterally. Left A1 hypoplastic, accounting for the slightly diminutive left ICA is compared to the right. Anterior cerebral arteries widely patent to their distal aspects without stenosis. M1 segments widely patent bilaterally. Negative MCA bifurcations. Distal MCA branches well perfused and symmetric. POSTERIOR CIRCULATION: Vertebral arteries patent to the vertebrobasilar junction without stenosis. Left vertebral artery dominant. Patent right PICA. Left PICA not seen. Basilar patent to its distal aspect without stenosis. Dominant left anterior inferior cerebral artery. Superior cerebral arteries patent bilaterally. Both of the posterior cerebral arteries are well perfused to their distal aspects. Mild distal small vessel atheromatous irregularity noted throughout the intracranial circulation. IMPRESSION: MRI HEAD IMPRESSION: 1. Patchy acute ischemic nonhemorrhagic left ACA territory infarcts involving the left  parasagittal frontoparietal region. No associated mass effect. 2. Chronic right PCA territory infarct involving the right occipital lobe. 3. Underlying age-related cerebral atrophy with moderate chronic microvascular ischemic disease, with multiple additional remote lacunar infarcts involving the bilateral basal ganglia, thalami, and cerebellum. MRA HEAD IMPRESSION: 1. Negative intracranial MRA for large vessel occlusion. No hemodynamically significant or proximal correctable stenosis. 2. Distal small vessel atheromatous irregularity throughout the intracranial circulation. Electronically Signed   By: Jeannine Boga M.D.   On: 06/27/2019 03:23   Vas US Carotid (at [place] Only)  Result Date: 06/28/2019 Carotid Arterial Duplex Study Indications:       CVA and Speech disturbance. Risk Factors:      Hypertension, hyperlipidemia, Diabetes. Comparison Study:  No prior study on file for comparison. Performing Technologist: Sharion Dove RVS  Examination Guidelines: A complete evaluation includes B-mode imaging, spectral Doppler, color Doppler, and power Doppler as needed of all accessible portions of each vessel. Bilateral testing is considered an integral part of a complete examination. Limited examinations for reoccurring indications may be performed as noted.  Right Carotid Findings: +----------+--------+--------+--------+------------+------------------+             PSV cm/s EDV cm/s Stenosis Describe     Comments            +----------+--------+--------+--------+------------+------------------+  CCA Prox   75       17                             intimal thickening  +----------+--------+--------+--------+------------+------------------+  CCA Distal 70       15                             intimal thickening  +----------+--------+--------+--------+------------+------------------+  ICA Prox   111      27                heterogenous Shadowing            +----------+--------+--------+--------+------------+------------------+  ICA Distal 87       21                                                 +----------+--------+--------+--------+------------+------------------+  ECA        80       7                                                  +----------+--------+--------+--------+------------+------------------+ +----------+--------+-------+--------+-------------------+             PSV cm/s EDV cms Describe Arm Pressure (mmHG)  +----------+--------+-------+--------+-------------------+  Subclavian 81                                             +----------+--------+-------+--------+-------------------+ +---------+--------+--+--------+--+  Vertebral PSV cm/s 83 EDV cm/s 12  +---------+--------+--+--------+--+  Left Carotid Findings: +----------+--------+--------+--------+--------+------------------+             PSV cm/s EDV cm/s Stenosis Describe Comments            +----------+--------+--------+--------+--------+------------------+  CCA Prox   109      16                         intimal thickening  +----------+--------+--------+--------+--------+------------------+  CCA Distal 89       12                         intimal thickening  +----------+--------+--------+--------+--------+------------------+  ICA Prox   152      31                calcific                     +----------+--------+--------+--------+--------+------------------+  ICA Distal 116      25                                             +----------+--------+--------+--------+--------+------------------+  ECA        72       9                                              +----------+--------+--------+--------+--------+------------------+ +----------+--------+--------+--------+-------------------+  Subclavian PSV cm/s EDV cm/s Describe Arm Pressure (mmHG)  +----------+--------+--------+--------+-------------------+             96                                               +----------+--------+--------+--------+-------------------+ +---------+--------+--+--------+--+  Vertebral PSV cm/s 50 EDV cm/s 11  +---------+--------+--+--------+--+  Summary: Right Carotid: Velocities in the right ICA are consistent with a 1-39% stenosis. Left Carotid: Velocities in the left ICA are consistent with a 1-39% stenosis. Vertebrals:  Bilateral vertebral arteries demonstrate antegrade flow. Subclavians: Normal flow hemodynamics were seen in bilateral subclavian              arteries. *See table(s) above for measurements and observations.  Electronically signed by Antony Contras MD on 06/28/2019 at 10:55:33 AM.    Final     Subjective:   Patient was seen and examined 06/29/2019, 1:41 PM Patient stable today. No acute distress.  No issues overnight Stable for discharge.  Discharge Exam:    Vitals:   06/29/19 0424 06/29/19 0700 06/29/19 0826 06/29/19 0827  BP: (!) 139/56 (!) 151/82 134/67   Pulse: (!) 52 70  66  Resp:      Temp: 98.6 F (37 C) 98.9 F (37.2 C)    TempSrc: Oral Oral    SpO2: 100% 98%    Weight:      Height:        General: Pt lying comfortably in bed & appears in no obvious distress. Cardiovascular: S1 & S2 heard, RRR, S1/S2 +. No murmurs, rubs, gallops or clicks. No JVD or pedal edema. Respiratory: Clear to auscultation without wheezing, rhonchi or crackles. No increased work of breathing. Abdominal:  Non-distended, non-tender & soft. No organomegaly or masses appreciated. Normal bowel sounds heard. CNS: Alert and oriented. No focal deficits. Extremities: no edema, no cyanosis    The results of significant diagnostics from this hospitalization (including  imaging, microbiology, ancillary and laboratory) are listed below for reference.      Microbiology:   Recent Results (from the past 240 hour(s))  SARS CORONAVIRUS 2     Status: None   Collection Time: 06/26/19 11:05 PM  Result Value Ref Range Status   SARS Coronavirus 2 NEGATIVE NEGATIVE Final     Comment: (NOTE) SARS-CoV-2 target nucleic acids are NOT DETECTED. The SARS-CoV-2 RNA is generally detectable in upper and lower respiratory specimens during the acute phase of infection. Negative results do not preclude SARS-CoV-2 infection, do not rule out co-infections with other pathogens, and should not be used as the sole basis for treatment or other patient management decisions. Negative results must be combined with clinical observations, patient history, and epidemiological information. The expected result is Negative. Fact Sheet for Patients: SugarRoll.be Fact Sheet for Healthcare Providers: https://www.woods-mathews.com/ This test is not yet approved or cleared by the [place] FDA and  has been authorized for detection and/or diagnosis of SARS-CoV-2 by FDA under an Emergency Use Authorization (EUA). This EUA will remain  in effect (meaning this test can be used) for the duration of the COVID-19 declaration under Section 56 4(b)(1) of the Act, 21 U.S.C. section 360bbb-3(b)(1), unless the authorization is terminated or revoked sooner. Performed at [facility], [place] [address], [place],  [address]      Labs:   CBC: Recent Labs  Lab 06/26/19 2032 06/27/19 0323  WBC 8.7 8.0  NEUTROABS 4.3  --   HGB 12.8 12.2  HCT 40.8 38.2  MCV 97.1 97.9  PLT 236 160   Basic Metabolic Panel: Recent Labs  Lab 06/26/19 2032 06/27/19 0323 06/28/19 0640 06/29/19 0359  NA 139 140 140 141  K 4.4 4.1 4.3 4.3  CL 106 108 107 108  CO2 21* 20* 22 24  GLUCOSE 172* 170* 178* 168*  BUN 31* 28* 17 19  CREATININE 1.32* 1.11* 0.86 0.97  CALCIUM 10.2 9.7 9.3 9.3   Liver Function Tests: Recent Labs  Lab 06/26/19 2032 06/27/19 0323  AST 19 17  ALT 25 22  ALKPHOS 70 61  BILITOT 0.4 0.2*  PROT 8.0 7.0  ALBUMIN 4.0 3.5   BNP (last 3 results) No results for input(s): BNP in the last 8760 hours. Cardiac Enzymes: No results  for input(s): CKTOTAL, CKMB, CKMBINDEX, TROPONINI in the last 168 hours. CBG: Recent Labs  Lab 06/28/19 0640 06/28/19 1130 06/28/19 1646 06/28/19 2114 06/29/19 0608  GLUCAP 167* 195* 135* 257* 166*   Hgb A1c Recent Labs    06/26/19 2032 06/27/19 0323  HGBA1C 7.7* 7.6*   Lipid Profile Recent Labs    06/26/19 2247 06/27/19 0323  CHOL 179 160  HDL 62 56  LDLCALC 99 89  TRIG 88 77  CHOLHDL 2.9 2.9  Urinalysis    Component Value Date/Time   COLORURINE YELLOW 07/05/2013 1206   APPEARANCEUR CLOUDY (A) 07/05/2013 1206   LABSPEC 1.014 07/05/2013 1206   PHURINE 6.0 07/05/2013 1206   GLUCOSEU NEGATIVE 07/05/2013 1206   HGBUR TRACE (A) 07/05/2013 1206   HGBUR negative 08/16/2009 1523   BILIRUBINUR neg 10/15/2018 0941   KETONESUR NEGATIVE 07/05/2013 1206   PROTEINUR Positive (A) 10/15/2018 0941   PROTEINUR NEGATIVE 07/05/2013 1206   UROBILINOGEN 0.2 10/15/2018 0941   UROBILINOGEN 0.2 07/05/2013 1206   NITRITE neg 10/15/2018 0941   NITRITE NEGATIVE 07/05/2013 1206   LEUKOCYTESUR Moderate (2+) (A) 10/15/2018 0941    Time coordinating discharge: Over 40 minutes  SIGNED:  Deatra James, MD, FACP, [facility]. [facility],  Pager [phone]  If 7PM-7AM, please contact night-coverage [email or web address] Ota [facility] 06/29/2019, 1:41 PM

## 2019-06-30 DIAGNOSIS — I69391 Dysphagia following cerebral infarction: Secondary | ICD-10-CM | POA: Diagnosis not present

## 2019-06-30 DIAGNOSIS — I69322 Dysarthria following cerebral infarction: Secondary | ICD-10-CM | POA: Diagnosis not present

## 2019-06-30 DIAGNOSIS — E782 Mixed hyperlipidemia: Secondary | ICD-10-CM | POA: Diagnosis not present

## 2019-06-30 DIAGNOSIS — R1310 Dysphagia, unspecified: Secondary | ICD-10-CM | POA: Diagnosis not present

## 2019-06-30 DIAGNOSIS — M545 Low back pain: Secondary | ICD-10-CM | POA: Diagnosis not present

## 2019-06-30 DIAGNOSIS — Z7902 Long term (current) use of antithrombotics/antiplatelets: Secondary | ICD-10-CM | POA: Diagnosis not present

## 2019-06-30 DIAGNOSIS — I6931 Attention and concentration deficit following cerebral infarction: Secondary | ICD-10-CM | POA: Diagnosis not present

## 2019-06-30 DIAGNOSIS — Z9181 History of falling: Secondary | ICD-10-CM | POA: Diagnosis not present

## 2019-06-30 DIAGNOSIS — E119 Type 2 diabetes mellitus without complications: Secondary | ICD-10-CM | POA: Diagnosis not present

## 2019-06-30 DIAGNOSIS — I10 Essential (primary) hypertension: Secondary | ICD-10-CM | POA: Diagnosis not present

## 2019-07-01 ENCOUNTER — Telehealth: Payer: Self-pay | Admitting: Family Medicine

## 2019-07-01 DIAGNOSIS — Z9181 History of falling: Secondary | ICD-10-CM | POA: Diagnosis not present

## 2019-07-01 DIAGNOSIS — E782 Mixed hyperlipidemia: Secondary | ICD-10-CM | POA: Diagnosis not present

## 2019-07-01 DIAGNOSIS — Z7902 Long term (current) use of antithrombotics/antiplatelets: Secondary | ICD-10-CM | POA: Diagnosis not present

## 2019-07-01 DIAGNOSIS — I10 Essential (primary) hypertension: Secondary | ICD-10-CM | POA: Diagnosis not present

## 2019-07-01 DIAGNOSIS — I69322 Dysarthria following cerebral infarction: Secondary | ICD-10-CM | POA: Diagnosis not present

## 2019-07-01 DIAGNOSIS — R1310 Dysphagia, unspecified: Secondary | ICD-10-CM | POA: Diagnosis not present

## 2019-07-01 DIAGNOSIS — E119 Type 2 diabetes mellitus without complications: Secondary | ICD-10-CM | POA: Diagnosis not present

## 2019-07-01 DIAGNOSIS — M545 Low back pain: Secondary | ICD-10-CM | POA: Diagnosis not present

## 2019-07-01 DIAGNOSIS — I6931 Attention and concentration deficit following cerebral infarction: Secondary | ICD-10-CM | POA: Diagnosis not present

## 2019-07-01 DIAGNOSIS — I69391 Dysphagia following cerebral infarction: Secondary | ICD-10-CM | POA: Diagnosis not present

## 2019-07-01 NOTE — Telephone Encounter (Signed)
Ok for orders? 

## 2019-07-01 NOTE — Telephone Encounter (Signed)
Please okay these orders  ?

## 2019-07-01 NOTE — Telephone Encounter (Signed)
See note

## 2019-07-01 NOTE — Telephone Encounter (Unsigned)
Copied from [place] [id number]. Topic: Quick Communication - Home Health Verbal Orders >> Jul 01, 2019 12:33 PM Yvette Rack wrote: Caller/Agency: Natasha with [facility] Callback Number: [phone] Requesting OT/PT/Skilled Nursing/Social Work/Speech Therapy: skilled nursing and home health aid Frequency: skilled nursing = 1 time a week for 9 weeks with 2 PRN visits   home health aid = 2 times a week for 3 weeks

## 2019-07-01 NOTE — Telephone Encounter (Signed)
Home Health Verbal Orders - Caller/Agency: Erlene Quan with [facility] Number: [phone], OK to leave a message Requesting OT/PT/Skilled Nursing/Social Work/Speech Therapy: OT Frequency: 1 week 4

## 2019-07-01 NOTE — Telephone Encounter (Signed)
Caller name: Sonia Baller  Relation to pt: Speech Therapist from [facility]  Call back number: [phone]    Reason for call:  Speech Therapist requesting verbal orders for home health speech and cognitive deficits after a stroke  2x 2, please advise

## 2019-07-02 DIAGNOSIS — I10 Essential (primary) hypertension: Secondary | ICD-10-CM | POA: Diagnosis not present

## 2019-07-02 DIAGNOSIS — E119 Type 2 diabetes mellitus without complications: Secondary | ICD-10-CM | POA: Diagnosis not present

## 2019-07-02 DIAGNOSIS — M545 Low back pain: Secondary | ICD-10-CM | POA: Diagnosis not present

## 2019-07-02 DIAGNOSIS — I6931 Attention and concentration deficit following cerebral infarction: Secondary | ICD-10-CM | POA: Diagnosis not present

## 2019-07-02 DIAGNOSIS — R1310 Dysphagia, unspecified: Secondary | ICD-10-CM | POA: Diagnosis not present

## 2019-07-02 DIAGNOSIS — I69391 Dysphagia following cerebral infarction: Secondary | ICD-10-CM | POA: Diagnosis not present

## 2019-07-02 DIAGNOSIS — E782 Mixed hyperlipidemia: Secondary | ICD-10-CM | POA: Diagnosis not present

## 2019-07-02 DIAGNOSIS — I69322 Dysarthria following cerebral infarction: Secondary | ICD-10-CM | POA: Diagnosis not present

## 2019-07-02 DIAGNOSIS — Z7902 Long term (current) use of antithrombotics/antiplatelets: Secondary | ICD-10-CM | POA: Diagnosis not present

## 2019-07-02 DIAGNOSIS — Z9181 History of falling: Secondary | ICD-10-CM | POA: Diagnosis not present

## 2019-07-02 NOTE — Telephone Encounter (Signed)
Ok to send orders.

## 2019-07-02 NOTE — Telephone Encounter (Signed)
Orders have been called in.

## 2019-07-03 DIAGNOSIS — I6931 Attention and concentration deficit following cerebral infarction: Secondary | ICD-10-CM | POA: Diagnosis not present

## 2019-07-03 DIAGNOSIS — M545 Low back pain: Secondary | ICD-10-CM | POA: Diagnosis not present

## 2019-07-03 DIAGNOSIS — I69322 Dysarthria following cerebral infarction: Secondary | ICD-10-CM | POA: Diagnosis not present

## 2019-07-03 DIAGNOSIS — Z9181 History of falling: Secondary | ICD-10-CM | POA: Diagnosis not present

## 2019-07-03 DIAGNOSIS — I10 Essential (primary) hypertension: Secondary | ICD-10-CM | POA: Diagnosis not present

## 2019-07-03 DIAGNOSIS — E782 Mixed hyperlipidemia: Secondary | ICD-10-CM | POA: Diagnosis not present

## 2019-07-03 DIAGNOSIS — E119 Type 2 diabetes mellitus without complications: Secondary | ICD-10-CM | POA: Diagnosis not present

## 2019-07-03 DIAGNOSIS — R1310 Dysphagia, unspecified: Secondary | ICD-10-CM | POA: Diagnosis not present

## 2019-07-03 DIAGNOSIS — I69391 Dysphagia following cerebral infarction: Secondary | ICD-10-CM | POA: Diagnosis not present

## 2019-07-03 DIAGNOSIS — Z7902 Long term (current) use of antithrombotics/antiplatelets: Secondary | ICD-10-CM | POA: Diagnosis not present

## 2019-07-03 NOTE — Telephone Encounter (Signed)
Left message for patient to call back. CRM created 

## 2019-07-03 NOTE — Telephone Encounter (Signed)
Please okay these orders  ?

## 2019-07-03 NOTE — Telephone Encounter (Signed)
See note

## 2019-07-03 NOTE — Telephone Encounter (Signed)
Message has been sent to Dr. Sarajane Jews. Awaiting a response.

## 2019-07-03 NOTE — Telephone Encounter (Signed)
[name] following up on request for verbal orders

## 2019-07-03 NOTE — Telephone Encounter (Signed)
[name] calling and needing to know status of verbal order

## 2019-07-06 DIAGNOSIS — E119 Type 2 diabetes mellitus without complications: Secondary | ICD-10-CM | POA: Diagnosis not present

## 2019-07-06 DIAGNOSIS — I6931 Attention and concentration deficit following cerebral infarction: Secondary | ICD-10-CM | POA: Diagnosis not present

## 2019-07-06 DIAGNOSIS — M545 Low back pain: Secondary | ICD-10-CM | POA: Diagnosis not present

## 2019-07-06 DIAGNOSIS — R1310 Dysphagia, unspecified: Secondary | ICD-10-CM | POA: Diagnosis not present

## 2019-07-06 DIAGNOSIS — E782 Mixed hyperlipidemia: Secondary | ICD-10-CM | POA: Diagnosis not present

## 2019-07-06 DIAGNOSIS — I69391 Dysphagia following cerebral infarction: Secondary | ICD-10-CM | POA: Diagnosis not present

## 2019-07-06 DIAGNOSIS — I69322 Dysarthria following cerebral infarction: Secondary | ICD-10-CM | POA: Diagnosis not present

## 2019-07-06 DIAGNOSIS — I10 Essential (primary) hypertension: Secondary | ICD-10-CM | POA: Diagnosis not present

## 2019-07-06 DIAGNOSIS — Z9181 History of falling: Secondary | ICD-10-CM | POA: Diagnosis not present

## 2019-07-06 DIAGNOSIS — Z7902 Long term (current) use of antithrombotics/antiplatelets: Secondary | ICD-10-CM | POA: Diagnosis not present

## 2019-07-07 ENCOUNTER — Other Ambulatory Visit: Payer: Self-pay | Admitting: Family Medicine

## 2019-07-07 DIAGNOSIS — I10 Essential (primary) hypertension: Secondary | ICD-10-CM | POA: Diagnosis not present

## 2019-07-07 DIAGNOSIS — I6931 Attention and concentration deficit following cerebral infarction: Secondary | ICD-10-CM | POA: Diagnosis not present

## 2019-07-07 DIAGNOSIS — Z9181 History of falling: Secondary | ICD-10-CM | POA: Diagnosis not present

## 2019-07-07 DIAGNOSIS — E782 Mixed hyperlipidemia: Secondary | ICD-10-CM | POA: Diagnosis not present

## 2019-07-07 DIAGNOSIS — Z7902 Long term (current) use of antithrombotics/antiplatelets: Secondary | ICD-10-CM | POA: Diagnosis not present

## 2019-07-07 DIAGNOSIS — M545 Low back pain: Secondary | ICD-10-CM | POA: Diagnosis not present

## 2019-07-07 DIAGNOSIS — E119 Type 2 diabetes mellitus without complications: Secondary | ICD-10-CM | POA: Diagnosis not present

## 2019-07-07 DIAGNOSIS — R1310 Dysphagia, unspecified: Secondary | ICD-10-CM | POA: Diagnosis not present

## 2019-07-07 DIAGNOSIS — I69322 Dysarthria following cerebral infarction: Secondary | ICD-10-CM | POA: Diagnosis not present

## 2019-07-07 DIAGNOSIS — I69391 Dysphagia following cerebral infarction: Secondary | ICD-10-CM | POA: Diagnosis not present

## 2019-07-07 MED ORDER — PIOGLITAZONE HCL 30 MG PO TABS: 30.0000 mg | ORAL_TABLET | Freq: Every day | ORAL | 0 refills | Status: DC

## 2019-07-07 MED ORDER — SPIRONOLACTONE 25 MG PO TABS: 25.0000 mg | ORAL_TABLET | Freq: Every day | ORAL | 0 refills | Status: DC

## 2019-07-07 MED ORDER — ROSUVASTATIN CALCIUM 40 MG PO TABS: ORAL_TABLET | ORAL | 0 refills | Status: DC

## 2019-07-07 NOTE — Telephone Encounter (Signed)
Verbal orders given to [facility]. No further action needed.

## 2019-07-08 ENCOUNTER — Encounter: Payer: Self-pay | Admitting: Family Medicine

## 2019-07-08 ENCOUNTER — Other Ambulatory Visit: Payer: Self-pay

## 2019-07-08 ENCOUNTER — Ambulatory Visit (INDEPENDENT_AMBULATORY_CARE_PROVIDER_SITE_OTHER): Payer: Medicare HMO | Admitting: Family Medicine

## 2019-07-08 DIAGNOSIS — R252 Cramp and spasm: Secondary | ICD-10-CM

## 2019-07-08 DIAGNOSIS — E782 Mixed hyperlipidemia: Secondary | ICD-10-CM

## 2019-07-08 DIAGNOSIS — Z8673 Personal history of transient ischemic attack (TIA), and cerebral infarction without residual deficits: Secondary | ICD-10-CM

## 2019-07-08 DIAGNOSIS — I69322 Dysarthria following cerebral infarction: Secondary | ICD-10-CM | POA: Diagnosis not present

## 2019-07-08 DIAGNOSIS — I6931 Attention and concentration deficit following cerebral infarction: Secondary | ICD-10-CM | POA: Diagnosis not present

## 2019-07-08 DIAGNOSIS — Z9181 History of falling: Secondary | ICD-10-CM | POA: Diagnosis not present

## 2019-07-08 DIAGNOSIS — E119 Type 2 diabetes mellitus without complications: Secondary | ICD-10-CM

## 2019-07-08 DIAGNOSIS — I69391 Dysphagia following cerebral infarction: Secondary | ICD-10-CM | POA: Diagnosis not present

## 2019-07-08 DIAGNOSIS — I10 Essential (primary) hypertension: Secondary | ICD-10-CM | POA: Diagnosis not present

## 2019-07-08 DIAGNOSIS — R1310 Dysphagia, unspecified: Secondary | ICD-10-CM | POA: Diagnosis not present

## 2019-07-08 DIAGNOSIS — Z7902 Long term (current) use of antithrombotics/antiplatelets: Secondary | ICD-10-CM | POA: Diagnosis not present

## 2019-07-08 DIAGNOSIS — M545 Low back pain: Secondary | ICD-10-CM | POA: Diagnosis not present

## 2019-07-08 DIAGNOSIS — I639 Cerebral infarction, unspecified: Secondary | ICD-10-CM

## 2019-07-08 NOTE — Patient Outreach (Signed)
[name]) Care Management  07/08/2019  [name] 1945/12/31 [id number]   Medication Adherence call to [name] [facility] Identifiers Verify spoke with patient she is past due on Rosuvastatin 40 mg patient explain she has been in the hospital patient had a stroke,patient is no longer under [place] has [employer]. [name] is showing past due under [place].   [facility] Management Direct Dial [phone]  Fax [phone] [name]

## 2019-07-08 NOTE — Progress Notes (Signed)
   Subjective:    Patient ID: [name], female    DOB: [birth year], 73 y.o.   MRN: [id number]  HPI Virtual Visit via Telephone Note  I connected with the patient on 07/08/19 at  3:30 PM EDT by telephone and verified that I am speaking with the correct person using two identifiers. We attempted to connect virtually but we had technical difficulties with the audio and video.     I discussed the limitations, risks, security and privacy concerns of performing an evaluation and management service by telephone and the availability of in person appointments. I also discussed with the patient that there may be a patient responsible charge related to this service. The patient expressed understanding and agreed to proceed.  Location patient: home Location provider: work or home office Participants present for the call: patient, provider Patient did not have a visit in the prior 7 days to address this/these issue(s).   History of Present Illness: Here with her daughter to follow up a hospital stay from 06-26-19 to 06-29-19 for a left frontoparietal nonhemorrhagic stroke. She presented with generalized weakness and slurred speech. A head CT showed several old strokes but nothing new. However a brain MRA showed acute ischemia as above. She was quickly stabilized and she has done well since then. Her speech is now back to normal and she feels fine. She was sent home on Plavix and aspirin. Her BP has been stable. Her diabetes has been a bit out of control, and her A1c in the hospital was 7.6. Her creatinine was normal at DC. She is on a statin. She does complain of frequent leg cramps, especially at night. Her recent potassium was normal.    Observations/Objective: Patient sounds cheerful and well on the phone. I do not appreciate any SOB. Speech and thought processing are grossly intact. Patient reported vitals:  Assessment and Plan: She is recovering from a recent stroke. Her speech is back to  normal. She will stay on Plavix and aspirin long term. Her HTN and dyslipidemia are stable. As for the diabetes, we decided to keep her medications as they are, but I strongly suggested she do a better job of reducing the carbs and starchy foods in her diet. For the leg cramps I suggetsed she take 2 tablets of 400 mg magnesium each night before bed. We will arrange for her to follow up with Neurology. Alysia Penna, MD   Follow Up Instructions:     [phone] [phone] 11-20 [phone] 21-30 I did not refer this patient for an OV in the next 24 hours for this/these issue(s).  I discussed the assessment and treatment plan with the patient. The patient was provided an opportunity to ask questions and all were answered. The patient agreed with the plan and demonstrated an understanding of the instructions.   The patient was advised to call back or seek an in-person evaluation if the symptoms worsen or if the condition fails to improve as anticipated.  I provided 26 minutes of non-face-to-face time during this encounter.   Alysia Penna, MD    Review of Systems     Objective:   Physical Exam        Assessment & Plan:

## 2019-07-09 DIAGNOSIS — I69391 Dysphagia following cerebral infarction: Secondary | ICD-10-CM | POA: Diagnosis not present

## 2019-07-09 DIAGNOSIS — R1310 Dysphagia, unspecified: Secondary | ICD-10-CM | POA: Diagnosis not present

## 2019-07-09 DIAGNOSIS — Z7902 Long term (current) use of antithrombotics/antiplatelets: Secondary | ICD-10-CM | POA: Diagnosis not present

## 2019-07-09 DIAGNOSIS — M545 Low back pain: Secondary | ICD-10-CM | POA: Diagnosis not present

## 2019-07-09 DIAGNOSIS — I69322 Dysarthria following cerebral infarction: Secondary | ICD-10-CM | POA: Diagnosis not present

## 2019-07-09 DIAGNOSIS — I6931 Attention and concentration deficit following cerebral infarction: Secondary | ICD-10-CM | POA: Diagnosis not present

## 2019-07-09 DIAGNOSIS — E782 Mixed hyperlipidemia: Secondary | ICD-10-CM | POA: Diagnosis not present

## 2019-07-09 DIAGNOSIS — I10 Essential (primary) hypertension: Secondary | ICD-10-CM | POA: Diagnosis not present

## 2019-07-09 DIAGNOSIS — Z9181 History of falling: Secondary | ICD-10-CM | POA: Diagnosis not present

## 2019-07-09 DIAGNOSIS — E119 Type 2 diabetes mellitus without complications: Secondary | ICD-10-CM | POA: Diagnosis not present

## 2019-07-10 ENCOUNTER — Telehealth: Payer: Self-pay | Admitting: Family Medicine

## 2019-07-10 DIAGNOSIS — E119 Type 2 diabetes mellitus without complications: Secondary | ICD-10-CM | POA: Diagnosis not present

## 2019-07-10 DIAGNOSIS — I6931 Attention and concentration deficit following cerebral infarction: Secondary | ICD-10-CM | POA: Diagnosis not present

## 2019-07-10 DIAGNOSIS — I10 Essential (primary) hypertension: Secondary | ICD-10-CM | POA: Diagnosis not present

## 2019-07-10 DIAGNOSIS — I69391 Dysphagia following cerebral infarction: Secondary | ICD-10-CM | POA: Diagnosis not present

## 2019-07-10 DIAGNOSIS — Z7902 Long term (current) use of antithrombotics/antiplatelets: Secondary | ICD-10-CM | POA: Diagnosis not present

## 2019-07-10 DIAGNOSIS — I69322 Dysarthria following cerebral infarction: Secondary | ICD-10-CM | POA: Diagnosis not present

## 2019-07-10 DIAGNOSIS — E782 Mixed hyperlipidemia: Secondary | ICD-10-CM | POA: Diagnosis not present

## 2019-07-10 DIAGNOSIS — M545 Low back pain: Secondary | ICD-10-CM | POA: Diagnosis not present

## 2019-07-10 DIAGNOSIS — Z9181 History of falling: Secondary | ICD-10-CM | POA: Diagnosis not present

## 2019-07-10 DIAGNOSIS — R1310 Dysphagia, unspecified: Secondary | ICD-10-CM | POA: Diagnosis not present

## 2019-07-10 NOTE — Telephone Encounter (Unsigned)
Copied from [place] [id number]. Topic: Quick Communication - Home Health Verbal Orders >> Jul 10, 2019 11:33 AM Yvette Rack wrote: Caller/Agency: Merry Proud with [facility] Callback Number: [phone] Requesting OT/PT/Skilled Nursing/Social Work/Speech Therapy: PT  Frequency: 2 week 3 and 1 week 4

## 2019-07-10 NOTE — Telephone Encounter (Signed)
Please okay the orders  

## 2019-07-10 NOTE — Telephone Encounter (Signed)
Ok for orders? 

## 2019-07-10 NOTE — Telephone Encounter (Signed)
See note

## 2019-07-10 NOTE — Telephone Encounter (Signed)
Verbal orders have been given. Nothing further needed.

## 2019-07-13 DIAGNOSIS — Z9181 History of falling: Secondary | ICD-10-CM | POA: Diagnosis not present

## 2019-07-13 DIAGNOSIS — E119 Type 2 diabetes mellitus without complications: Secondary | ICD-10-CM | POA: Diagnosis not present

## 2019-07-13 DIAGNOSIS — I69322 Dysarthria following cerebral infarction: Secondary | ICD-10-CM | POA: Diagnosis not present

## 2019-07-13 DIAGNOSIS — Z7902 Long term (current) use of antithrombotics/antiplatelets: Secondary | ICD-10-CM | POA: Diagnosis not present

## 2019-07-13 DIAGNOSIS — I69391 Dysphagia following cerebral infarction: Secondary | ICD-10-CM | POA: Diagnosis not present

## 2019-07-13 DIAGNOSIS — E782 Mixed hyperlipidemia: Secondary | ICD-10-CM | POA: Diagnosis not present

## 2019-07-13 DIAGNOSIS — R1310 Dysphagia, unspecified: Secondary | ICD-10-CM | POA: Diagnosis not present

## 2019-07-13 DIAGNOSIS — I6931 Attention and concentration deficit following cerebral infarction: Secondary | ICD-10-CM | POA: Diagnosis not present

## 2019-07-13 DIAGNOSIS — I10 Essential (primary) hypertension: Secondary | ICD-10-CM | POA: Diagnosis not present

## 2019-07-13 DIAGNOSIS — M545 Low back pain: Secondary | ICD-10-CM | POA: Diagnosis not present

## 2019-07-14 DIAGNOSIS — I10 Essential (primary) hypertension: Secondary | ICD-10-CM | POA: Diagnosis not present

## 2019-07-14 DIAGNOSIS — I69391 Dysphagia following cerebral infarction: Secondary | ICD-10-CM | POA: Diagnosis not present

## 2019-07-14 DIAGNOSIS — Z9181 History of falling: Secondary | ICD-10-CM | POA: Diagnosis not present

## 2019-07-14 DIAGNOSIS — E119 Type 2 diabetes mellitus without complications: Secondary | ICD-10-CM | POA: Diagnosis not present

## 2019-07-14 DIAGNOSIS — I6931 Attention and concentration deficit following cerebral infarction: Secondary | ICD-10-CM | POA: Diagnosis not present

## 2019-07-14 DIAGNOSIS — M545 Low back pain: Secondary | ICD-10-CM | POA: Diagnosis not present

## 2019-07-14 DIAGNOSIS — R1310 Dysphagia, unspecified: Secondary | ICD-10-CM | POA: Diagnosis not present

## 2019-07-14 DIAGNOSIS — Z7902 Long term (current) use of antithrombotics/antiplatelets: Secondary | ICD-10-CM | POA: Diagnosis not present

## 2019-07-14 DIAGNOSIS — E782 Mixed hyperlipidemia: Secondary | ICD-10-CM | POA: Diagnosis not present

## 2019-07-14 DIAGNOSIS — I69322 Dysarthria following cerebral infarction: Secondary | ICD-10-CM | POA: Diagnosis not present

## 2019-07-15 DIAGNOSIS — E119 Type 2 diabetes mellitus without complications: Secondary | ICD-10-CM | POA: Diagnosis not present

## 2019-07-15 DIAGNOSIS — Z7902 Long term (current) use of antithrombotics/antiplatelets: Secondary | ICD-10-CM | POA: Diagnosis not present

## 2019-07-15 DIAGNOSIS — R1310 Dysphagia, unspecified: Secondary | ICD-10-CM | POA: Diagnosis not present

## 2019-07-15 DIAGNOSIS — I69322 Dysarthria following cerebral infarction: Secondary | ICD-10-CM | POA: Diagnosis not present

## 2019-07-15 DIAGNOSIS — Z9181 History of falling: Secondary | ICD-10-CM | POA: Diagnosis not present

## 2019-07-15 DIAGNOSIS — E782 Mixed hyperlipidemia: Secondary | ICD-10-CM | POA: Diagnosis not present

## 2019-07-15 DIAGNOSIS — I10 Essential (primary) hypertension: Secondary | ICD-10-CM | POA: Diagnosis not present

## 2019-07-15 DIAGNOSIS — I69391 Dysphagia following cerebral infarction: Secondary | ICD-10-CM | POA: Diagnosis not present

## 2019-07-15 DIAGNOSIS — I6931 Attention and concentration deficit following cerebral infarction: Secondary | ICD-10-CM | POA: Diagnosis not present

## 2019-07-15 DIAGNOSIS — M545 Low back pain: Secondary | ICD-10-CM | POA: Diagnosis not present

## 2019-07-16 ENCOUNTER — Telehealth: Payer: Self-pay | Admitting: Family Medicine

## 2019-07-16 DIAGNOSIS — E119 Type 2 diabetes mellitus without complications: Secondary | ICD-10-CM | POA: Diagnosis not present

## 2019-07-16 DIAGNOSIS — M545 Low back pain: Secondary | ICD-10-CM | POA: Diagnosis not present

## 2019-07-16 DIAGNOSIS — E782 Mixed hyperlipidemia: Secondary | ICD-10-CM | POA: Diagnosis not present

## 2019-07-16 DIAGNOSIS — I69391 Dysphagia following cerebral infarction: Secondary | ICD-10-CM | POA: Diagnosis not present

## 2019-07-16 DIAGNOSIS — Z7902 Long term (current) use of antithrombotics/antiplatelets: Secondary | ICD-10-CM | POA: Diagnosis not present

## 2019-07-16 DIAGNOSIS — I10 Essential (primary) hypertension: Secondary | ICD-10-CM | POA: Diagnosis not present

## 2019-07-16 DIAGNOSIS — Z9181 History of falling: Secondary | ICD-10-CM | POA: Diagnosis not present

## 2019-07-16 DIAGNOSIS — I69322 Dysarthria following cerebral infarction: Secondary | ICD-10-CM | POA: Diagnosis not present

## 2019-07-16 DIAGNOSIS — R1310 Dysphagia, unspecified: Secondary | ICD-10-CM | POA: Diagnosis not present

## 2019-07-16 DIAGNOSIS — I6931 Attention and concentration deficit following cerebral infarction: Secondary | ICD-10-CM | POA: Diagnosis not present

## 2019-07-16 NOTE — Telephone Encounter (Signed)
Dena with [facility] calling for clarification of the CVA pt experienced.. And clarification of the dx and what this may have attributed to: like dysphasia , muscle weakness Their coders are looking for this.  direct cb [phone]

## 2019-07-16 NOTE — Telephone Encounter (Signed)
Please advise 

## 2019-07-17 NOTE — Telephone Encounter (Signed)
Her speech is back to normal. The stroke left her with generalized weakness however

## 2019-07-17 NOTE — Telephone Encounter (Signed)
Spoke with Hinton Dyer, information was given. Nothing further needed.

## 2019-07-21 ENCOUNTER — Ambulatory Visit: Payer: Medicare HMO | Admitting: Neurology

## 2019-07-21 ENCOUNTER — Encounter: Payer: Self-pay | Admitting: Neurology

## 2019-07-21 ENCOUNTER — Other Ambulatory Visit: Payer: Self-pay

## 2019-07-21 VITALS — BP 147/68 | HR 72 | Temp 98.0°F | Wt 247.0 lb

## 2019-07-21 DIAGNOSIS — I63522 Cerebral infarction due to unspecified occlusion or stenosis of left anterior cerebral artery: Secondary | ICD-10-CM | POA: Diagnosis not present

## 2019-07-21 DIAGNOSIS — R269 Unspecified abnormalities of gait and mobility: Secondary | ICD-10-CM | POA: Diagnosis not present

## 2019-07-21 DIAGNOSIS — I10 Essential (primary) hypertension: Secondary | ICD-10-CM | POA: Diagnosis not present

## 2019-07-21 DIAGNOSIS — I69391 Dysphagia following cerebral infarction: Secondary | ICD-10-CM | POA: Diagnosis not present

## 2019-07-21 DIAGNOSIS — Z9181 History of falling: Secondary | ICD-10-CM | POA: Diagnosis not present

## 2019-07-21 DIAGNOSIS — E782 Mixed hyperlipidemia: Secondary | ICD-10-CM | POA: Diagnosis not present

## 2019-07-21 DIAGNOSIS — I69322 Dysarthria following cerebral infarction: Secondary | ICD-10-CM | POA: Diagnosis not present

## 2019-07-21 DIAGNOSIS — M545 Low back pain: Secondary | ICD-10-CM | POA: Diagnosis not present

## 2019-07-21 DIAGNOSIS — Z7902 Long term (current) use of antithrombotics/antiplatelets: Secondary | ICD-10-CM | POA: Diagnosis not present

## 2019-07-21 DIAGNOSIS — I6931 Attention and concentration deficit following cerebral infarction: Secondary | ICD-10-CM | POA: Diagnosis not present

## 2019-07-21 DIAGNOSIS — E119 Type 2 diabetes mellitus without complications: Secondary | ICD-10-CM | POA: Diagnosis not present

## 2019-07-21 DIAGNOSIS — R1310 Dysphagia, unspecified: Secondary | ICD-10-CM | POA: Diagnosis not present

## 2019-07-21 NOTE — Patient Instructions (Signed)
I had a long d/w patient and her daughter about her recent embolic stroke, risk for recurrent stroke/TIAs, personally independently reviewed imaging studies and stroke evaluation results and answered questions.Continue aspirin 81 mg daily alone  for secondary stroke prevention and stop plavix now and maintain strict control of hypertension with blood pressure goal below 130/90, diabetes with hemoglobin A1c goal below 6.5% and lipids with LDL cholesterol goal below 70 mg/dL. I also advised the patient to eat a healthy diet with plenty of whole grains, cereals, fruits and vegetables, exercise regularly and maintain ideal body weight . Consider possible participation in [facility] trial if interested.Followup in the future with me in 3 months or call earlier if needed.   Stroke Prevention Some medical conditions and behaviors are associated with a higher chance of having a stroke. You can help prevent a stroke by making nutrition, lifestyle, and other changes, including managing any medical conditions you may have. What nutrition changes can be made?   Eat healthy foods. You can do this by: ? Choosing foods high in fiber, such as fresh fruits and vegetables and whole grains. ? Eating at least 5 or more servings of fruits and vegetables a day. Try to fill half of your plate at each meal with fruits and vegetables. ? Choosing lean protein foods, such as lean cuts of meat, poultry without skin, fish, tofu, beans, and nuts. ? Eating low-fat dairy products. ? Avoiding foods that are high in salt (sodium). This can help lower blood pressure. ? Avoiding foods that have saturated fat, trans fat, and cholesterol. This can help prevent high cholesterol. ? Avoiding processed and premade foods.  Follow your health care provider's specific guidelines for losing weight, controlling high blood pressure (hypertension), lowering high cholesterol, and managing diabetes. These may include: ? Reducing your daily calorie  intake. ? Limiting your daily sodium intake to 1,500 milligrams (mg). ? Using only healthy fats for cooking, such as olive oil, canola oil, or sunflower oil. ? Counting your daily carbohydrate intake. What lifestyle changes can be made?  Maintain a healthy weight. Talk to your health care provider about your ideal weight.  Get at least 30 minutes of moderate physical activity at least 5 days a week. Moderate activity includes brisk walking, biking, and swimming.  Do not use any products that contain nicotine or tobacco, such as cigarettes and e-cigarettes. If you need help quitting, ask your health care provider. It may also be helpful to avoid exposure to secondhand smoke.  Limit alcohol intake to no more than 1 drink a day for nonpregnant women and 2 drinks a day for men. One drink equals 12 oz of beer, 5 oz of wine, or 1 oz of hard liquor.  Stop any illegal drug use.  Avoid taking birth control pills. Talk to your health care provider about the risks of taking birth control pills if: ? You are over 42 years old. ? You smoke. ? You get migraines. ? You have ever had a blood clot. What other changes can be made?  Manage your cholesterol levels. ? Eating a healthy diet is important for preventing high cholesterol. If cholesterol cannot be managed through diet alone, you may also need to take medicines. ? Take any prescribed medicines to control your cholesterol as told by your health care provider.  Manage your diabetes. ? Eating a healthy diet and exercising regularly are important parts of managing your blood sugar. If your blood sugar cannot be managed through diet and exercise, you  may need to take medicines. ? Take any prescribed medicines to control your diabetes as told by your health care provider.  Control your hypertension. ? To reduce your risk of stroke, try to keep your blood pressure below 130/80. ? Eating a healthy diet and exercising regularly are an important part  of controlling your blood pressure. If your blood pressure cannot be managed through diet and exercise, you may need to take medicines. ? Take any prescribed medicines to control hypertension as told by your health care provider. ? Ask your health care provider if you should monitor your blood pressure at home. ? Have your blood pressure checked every year, even if your blood pressure is normal. Blood pressure increases with age and some medical conditions.  Get evaluated for sleep disorders (sleep apnea). Talk to your health care provider about getting a sleep evaluation if you snore a lot or have excessive sleepiness.  Take over-the-counter and prescription medicines only as told by your health care provider. Aspirin or blood thinners (antiplatelets or anticoagulants) may be recommended to reduce your risk of forming blood clots that can lead to stroke.  Make sure that any other medical conditions you have, such as atrial fibrillation or atherosclerosis, are managed. What are the warning signs of a stroke? The warning signs of a stroke can be easily remembered as BEFAST.  B is for balance. Signs include: ? Dizziness. ? Loss of balance or coordination. ? Sudden trouble walking.  E is for eyes. Signs include: ? A sudden change in vision. ? Trouble seeing.  F is for face. Signs include: ? Sudden weakness or numbness of the face. ? The face or eyelid drooping to one side.  A is for arms. Signs include: ? Sudden weakness or numbness of the arm, usually on one side of the body.  S is for speech. Signs include: ? Trouble speaking (aphasia). ? Trouble understanding.  T is for time. ? These symptoms may represent a serious problem that is an emergency. Do not wait to see if the symptoms will go away. Get medical help right away. Call your local emergency services (911 in the [place] Do not drive yourself to the hospital.  Other signs of stroke may include: ? A sudden, severe headache  with no known cause. ? Nausea or vomiting. ? Seizure. Where to find more information For more information, visit:  American Stroke Association: www.strokeassociation.org  National Stroke Association: www.stroke.org Summary  You can prevent a stroke by eating healthy, exercising, not smoking, limiting alcohol intake, and managing any medical conditions you may have.  Do not use any products that contain nicotine or tobacco, such as cigarettes and e-cigarettes. If you need help quitting, ask your health care provider. It may also be helpful to avoid exposure to secondhand smoke.  Remember BEFAST for warning signs of stroke. Get help right away if you or a loved one has any of these signs. This information is not intended to replace advice given to you by your health care provider. Make sure you discuss any questions you have with your health care provider. Document Released: 12/20/2004 Document Revised: 10/25/2017 Document Reviewed: 12/18/2016 Elsevier Patient Education  2020 [employer].

## 2019-07-21 NOTE — Progress Notes (Signed)
[facility] [address] [place]. [place] [address] [phone]       OFFICE CONSULT NOTE  [name] Date of Birth:  19-Feb-1946 Medical Record Number:  [id number]   Referring MD: Alysia Penna  Reason for Referral: Stroke  HPI: [name] is a 73 year old African-American lady seen today for initial office consultation visit for stroke.  She is accompanied by her daughter.  History is obtained from them, review of electronic medical records and have personally reviewed imaging films in PACS.  [name] is a 73 year old pleasant African-American lady with past medical history of diabetes, prior hypertension, hyperlipidemia presented with sudden onset of expressive language difficulties and dysarthria on 06/27/2019.  CT scan of the head on admission showed multiple lacunar infarcts of chronic and possibly subacute ages however MRI scan revealed an acute nonhemorrhagic left anterior cerebral artery infarct involving left parasagittal frontoparietal region.  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  MRI of the brain showed no significant large vessel stenosis or occlusion.  MRA of the neck was read as showing 50% right ICA stenosis but per my review there is clearly no significant stenosis noted.  This was confirmed on carotid ultrasound which shows bilateral only 1-39% stenosis.  Transthoracic echo showed normal ejection fraction with severe left ventricular hypertrophy and aortic valve calcification.  Hemoglobin A1c was elevated at 7.6.  LDL cholesterol was 89 mg percent.  Patient was started on dual antiplatelet therapy as well as seen by physical occupational and speech therapy.  She was discharged home and has made gradual improvement.  She is finished speech and physical therapy.  She still has some word hesitancy and trouble speaking but is gradually getting better.  She is now able to ambulate with a wheeled walker.  She  is pretty safe and has no falls or injuries.  She feels chronic knee pain from arthritis despite knee replacement is the more reason for her walker.  She is tolerating aspirin Plavix well without bruising or bleeding.  She states her blood pressures under good control.  Patient is working with primary physician to get her sugar and cholesterol under better control.  She has no new complaints today.  She is living at home with daughter providing support.  She had no prior known history of stroke and her right occipital infarct is apparently clinically silent as she does not have peripheral visual field loss.  She denies any history of atrial fibrillation, palpitations, cardiac arrhythmias or significant cardiac disease.  ROS:   14 system review of systems is positive for speech difficulty, word hesitancy, gait difficulty, balance problems, knee pain, joint pain, hand weakness and all other systems negative PMH:  Past Medical History:  Diagnosis Date   Acute bronchitis 07/22/2008   Diabetes mellitus without complication ([place]) 4/00/8676   HYPERLIPIDEMIA, MIXED 07/18/2007   Hypertension     Social History:  Social History   Socioeconomic History   Marital status: Widowed    Spouse name: Not on file   Number of children: 0   Years of education: Not on file   Highest education level: Not on file  Occupational History   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  Social Needs   Financial resource strain: Not hard at all   Food insecurity    Worry: Never true    Inability: Never true   Transportation needs    Medical: No    Non-medical: No  Tobacco Use  Smoking status: Former Smoker    Packs/day: 0.50    Years: 48.00    Pack years: 24.00    Types: Cigarettes    Start date: 11/26/1968   Smokeless tobacco: Never Used   Tobacco comment: trying to quit;  not really motivated at present  Substance and Sexual Activity   Alcohol use: Not Currently    Alcohol/week: 0.0 standard drinks    Comment:  rare   Drug use: No   Sexual activity: Not on file  Lifestyle   Physical activity    Days per week: 0 days    Minutes per session: 0 min   Stress: Not at all  Relationships   Social connections    Talks on phone: Twice a week    Gets together: Once a week    Attends religious service: More than 4 times per year    Active member of club or organization: No    Attends meetings of clubs or organizations: Never    Relationship status: Widowed   Intimate partner violence    Fear of current or ex partner: Not on file    Emotionally abused: Not on file    Physically abused: Not on file    Forced sexual activity: Not on file  Other Topics Concern   Not on file  Social History Narrative   01/23/2019:    Lives with "great-grandson" in one level with new dog, Brandon.    Hx raising of her deceased sister's 3 children, and is now raising/supporting 10yo  'great-grandson'.    Attends church, church community as support system. Many of her family has passed away.   Enjoys watching TV; used to enjoy going fishing          Medications:   Current Outpatient Medications on File Prior to Visit  Medication Sig Dispense Refill   acetaminophen (TYLENOL) 325 MG tablet Take 650 mg by mouth every 6 (six) hours as needed for headache (pain).     Alcohol Swabs (ALCOHOL PREP) PADS Use to test 1 X day and diagnosis code is E 11.9 100 each 1   amLODipine (NORVASC) 10 MG tablet TAKE 1 TABLET BY MOUTH ONCE DAILY (Patient taking differently: Take 10 mg by mouth daily. ) 90 tablet 3   aspirin 81 MG chewable tablet Chew 1 tablet (81 mg total) by mouth daily. 30 tablet 3   diclofenac (VOLTAREN) 75 MG EC tablet Take 1 tablet by mouth twice daily (Patient taking differently: Take 75 mg by mouth 2 (two) times daily. ) 60 tablet 0   glipiZIDE (GLUCOTROL) 10 MG tablet TAKE 1 TABLET BY MOUTH TWICE DAILY BEFORE  A  MEAL (Patient taking differently: Take 10 mg by mouth 2 (two) times daily before a meal. )  180 tablet 0   glucose blood (ONETOUCH VERIO) test strip Test 1 x day and diagnosis code is E 11.9 100 each 1   Lancets (ONETOUCH ULTRASOFT) lancets Test 1 X day and diagnosis code is E 11.9 100 each 3   lisinopril (PRINIVIL,ZESTRIL) 10 MG tablet Take 2 tablets (20 mg total) by mouth daily. 60 tablet 11   metFORMIN (GLUCOPHAGE) 500 MG tablet TAKE 1 TABLET BY MOUTH TWICE DAILY WITH MEALS 180 tablet 0   metoprolol succinate (TOPROL-XL) 50 MG 24 hr tablet Take 1 tablet (50 mg total) by mouth daily. Take with or immediately following a meal. 30 tablet 11   pioglitazone (ACTOS) 30 MG tablet Take 1 tablet (30 mg total) by mouth daily.  90 tablet 0   rosuvastatin (CRESTOR) 40 MG tablet Take one tablet by mouth daily. Patient must have an OV for further refills. 30 tablet 0   spironolactone (ALDACTONE) 25 MG tablet Take 1 tablet (25 mg total) by mouth daily. 90 tablet 0   No current facility-administered medications on file prior to visit.     Allergies:   Allergies  Allergen Reactions   Hydromet [Hydrocodone-Homatropine] Itching    Physical Exam General: Obese elderly African-American lady, seated, in no evident distress Head: head normocephalic and atraumatic.   Neck: supple with no carotid or supraclavicular bruits Cardiovascular: regular rate and rhythm, no murmurs Musculoskeletal: no deformity Skin:  no rash/petichiae Vascular:  Normal pulses all extremities  Neurologic Exam Mental Status: Awake and fully alert. Oriented to place and time. Recent and remote memory intact. Attention span, concentration and fund of knowledge appropriate. Mood and affect appropriate.  Cranial Nerves: Fundoscopic exam reveals sharp disc margins. Pupils equal, briskly reactive to light. Extraocular movements full without nystagmus. Visual fields full to confrontation. Hearing intact. Facial sensation intact.  Mild right lower nasolabial fold asymmetry when she smiles., tongue, palate moves normally and  symmetrically.  Motor: Normal bulk and tone. Normal strength in all tested extremity muscles except diminished fine finger movements on the right, mild right grip and hip flexor weakness.  Orbits left over right upper extremity.. Sensory.: intact to touch , pinprick , position and vibratory sensation.  Coordination: Rapid alternating movements normal in all extremities. Finger-to-nose and heel-to-shin performed accurately bilaterally. Gait and Station: Arises from chair without difficulty. Stance is normal. Gait demonstrates normal stride length and balance . Able to heel, toe and tandem walk without difficulty.  Reflexes: 1+ and symmetric. Toes downgoing.   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    PLAN: I had a long d/w patient and her daughter about her recent embolic stroke, risk for recurrent stroke/TIAs, personally independently reviewed imaging studies and stroke evaluation results and answered questions.Continue aspirin 81 mg daily alone  for secondary stroke prevention and stop plavix now and maintain strict control of hypertension with blood pressure goal below 130/90, diabetes with hemoglobin A1c goal below 6.5% and lipids with LDL cholesterol goal below 70 mg/dL. I also advised the patient to eat a healthy diet with plenty of whole grains, cereals, fruits and vegetables, exercise regularly and maintain ideal body weight . Consider possible participation in [place] trial if interested.greater than 50% time during this 45-minute consultation visit was spent on counseling and coordination of care about her recurrent embolic cryptogenic strokes and discussion about evaluation and treatment  and answering questions.  Followup in the future with me in 3 months or call earlier if needed. Antony Contras, MD  [facility] [address] [place], [place] [address]  Phone [phone] Fax [phone] Note: This document was prepared with digital dictation and possible smart phrase technology. Any transcriptional errors that result from this process are unintentional.

## 2019-07-23 DIAGNOSIS — R1310 Dysphagia, unspecified: Secondary | ICD-10-CM | POA: Diagnosis not present

## 2019-07-23 DIAGNOSIS — I6931 Attention and concentration deficit following cerebral infarction: Secondary | ICD-10-CM | POA: Diagnosis not present

## 2019-07-23 DIAGNOSIS — M545 Low back pain: Secondary | ICD-10-CM | POA: Diagnosis not present

## 2019-07-23 DIAGNOSIS — E782 Mixed hyperlipidemia: Secondary | ICD-10-CM | POA: Diagnosis not present

## 2019-07-23 DIAGNOSIS — E119 Type 2 diabetes mellitus without complications: Secondary | ICD-10-CM | POA: Diagnosis not present

## 2019-07-23 DIAGNOSIS — I10 Essential (primary) hypertension: Secondary | ICD-10-CM | POA: Diagnosis not present

## 2019-07-23 DIAGNOSIS — I69322 Dysarthria following cerebral infarction: Secondary | ICD-10-CM | POA: Diagnosis not present

## 2019-07-23 DIAGNOSIS — Z9181 History of falling: Secondary | ICD-10-CM | POA: Diagnosis not present

## 2019-07-23 DIAGNOSIS — Z7902 Long term (current) use of antithrombotics/antiplatelets: Secondary | ICD-10-CM | POA: Diagnosis not present

## 2019-07-23 DIAGNOSIS — I69391 Dysphagia following cerebral infarction: Secondary | ICD-10-CM | POA: Diagnosis not present

## 2019-07-24 DIAGNOSIS — E119 Type 2 diabetes mellitus without complications: Secondary | ICD-10-CM | POA: Diagnosis not present

## 2019-07-24 DIAGNOSIS — I6931 Attention and concentration deficit following cerebral infarction: Secondary | ICD-10-CM | POA: Diagnosis not present

## 2019-07-24 DIAGNOSIS — M545 Low back pain: Secondary | ICD-10-CM | POA: Diagnosis not present

## 2019-07-24 DIAGNOSIS — R1310 Dysphagia, unspecified: Secondary | ICD-10-CM | POA: Diagnosis not present

## 2019-07-24 DIAGNOSIS — I69322 Dysarthria following cerebral infarction: Secondary | ICD-10-CM | POA: Diagnosis not present

## 2019-07-24 DIAGNOSIS — E782 Mixed hyperlipidemia: Secondary | ICD-10-CM | POA: Diagnosis not present

## 2019-07-24 DIAGNOSIS — I10 Essential (primary) hypertension: Secondary | ICD-10-CM | POA: Diagnosis not present

## 2019-07-24 DIAGNOSIS — Z7902 Long term (current) use of antithrombotics/antiplatelets: Secondary | ICD-10-CM | POA: Diagnosis not present

## 2019-07-24 DIAGNOSIS — I69391 Dysphagia following cerebral infarction: Secondary | ICD-10-CM | POA: Diagnosis not present

## 2019-07-24 DIAGNOSIS — Z9181 History of falling: Secondary | ICD-10-CM | POA: Diagnosis not present

## 2019-07-26 ENCOUNTER — Other Ambulatory Visit: Payer: Self-pay | Admitting: Family Medicine

## 2019-07-27 DIAGNOSIS — E119 Type 2 diabetes mellitus without complications: Secondary | ICD-10-CM | POA: Diagnosis not present

## 2019-07-27 DIAGNOSIS — I10 Essential (primary) hypertension: Secondary | ICD-10-CM | POA: Diagnosis not present

## 2019-07-27 DIAGNOSIS — Z7902 Long term (current) use of antithrombotics/antiplatelets: Secondary | ICD-10-CM | POA: Diagnosis not present

## 2019-07-27 DIAGNOSIS — I69322 Dysarthria following cerebral infarction: Secondary | ICD-10-CM | POA: Diagnosis not present

## 2019-07-27 DIAGNOSIS — M545 Low back pain: Secondary | ICD-10-CM | POA: Diagnosis not present

## 2019-07-27 DIAGNOSIS — I6931 Attention and concentration deficit following cerebral infarction: Secondary | ICD-10-CM | POA: Diagnosis not present

## 2019-07-27 DIAGNOSIS — I69391 Dysphagia following cerebral infarction: Secondary | ICD-10-CM | POA: Diagnosis not present

## 2019-07-27 DIAGNOSIS — Z9181 History of falling: Secondary | ICD-10-CM | POA: Diagnosis not present

## 2019-07-27 DIAGNOSIS — E782 Mixed hyperlipidemia: Secondary | ICD-10-CM | POA: Diagnosis not present

## 2019-07-27 DIAGNOSIS — R1310 Dysphagia, unspecified: Secondary | ICD-10-CM | POA: Diagnosis not present

## 2019-07-28 ENCOUNTER — Telehealth: Payer: Self-pay | Admitting: Family Medicine

## 2019-07-28 NOTE — Telephone Encounter (Signed)
Glenard Haring, from [facility], calling to speak with someone about pts medications. She states pts daughter is concerned about Actos and Norvasc. Please advise.   [phone] secure

## 2019-07-28 NOTE — Telephone Encounter (Signed)
Left message for Glenard Haring to call back. Please see what concerns they have with the medications.  CRM created.

## 2019-07-29 ENCOUNTER — Telehealth: Payer: Self-pay | Admitting: Family Medicine

## 2019-07-29 DIAGNOSIS — E119 Type 2 diabetes mellitus without complications: Secondary | ICD-10-CM | POA: Diagnosis not present

## 2019-07-29 DIAGNOSIS — M545 Low back pain: Secondary | ICD-10-CM | POA: Diagnosis not present

## 2019-07-29 DIAGNOSIS — I69391 Dysphagia following cerebral infarction: Secondary | ICD-10-CM | POA: Diagnosis not present

## 2019-07-29 DIAGNOSIS — E782 Mixed hyperlipidemia: Secondary | ICD-10-CM | POA: Diagnosis not present

## 2019-07-29 DIAGNOSIS — Z9181 History of falling: Secondary | ICD-10-CM | POA: Diagnosis not present

## 2019-07-29 DIAGNOSIS — I10 Essential (primary) hypertension: Secondary | ICD-10-CM | POA: Diagnosis not present

## 2019-07-29 DIAGNOSIS — I6931 Attention and concentration deficit following cerebral infarction: Secondary | ICD-10-CM | POA: Diagnosis not present

## 2019-07-29 DIAGNOSIS — I69322 Dysarthria following cerebral infarction: Secondary | ICD-10-CM | POA: Diagnosis not present

## 2019-07-29 DIAGNOSIS — R1310 Dysphagia, unspecified: Secondary | ICD-10-CM | POA: Diagnosis not present

## 2019-07-29 DIAGNOSIS — Z7902 Long term (current) use of antithrombotics/antiplatelets: Secondary | ICD-10-CM | POA: Diagnosis not present

## 2019-07-29 MED ORDER — LISINOPRIL 10 MG PO TABS: 20.0000 mg | ORAL_TABLET | Freq: Every day | ORAL | 3 refills | Status: DC

## 2019-07-29 MED ORDER — METOPROLOL SUCCINATE ER 50 MG PO TB24: 50.0000 mg | ORAL_TABLET | Freq: Every day | ORAL | 3 refills | Status: DC

## 2019-07-29 NOTE — Telephone Encounter (Signed)
lisinopril (PRINIVIL,ZESTRIL) 10 MG tablet metoprolol succinate (TOPROL-XL) 50 MG 24 hr tablet pioglitazone (ACTOS) 30 MG tablet   [phone] [facility] called regarding questions concerning listed medications. Pt is unsure of what she is currently taking. Please advise

## 2019-07-29 NOTE — Telephone Encounter (Signed)
Spoke to the nurse. She stated the patient does not have these medications and that she needs refills. Actos was sent in on 07/07/2019, the nurse is aware. Refills for lisinopril and metoprolol have been sent in.

## 2019-07-30 DIAGNOSIS — Z96653 Presence of artificial knee joint, bilateral: Secondary | ICD-10-CM

## 2019-07-30 DIAGNOSIS — E782 Mixed hyperlipidemia: Secondary | ICD-10-CM

## 2019-07-30 DIAGNOSIS — I10 Essential (primary) hypertension: Secondary | ICD-10-CM

## 2019-07-30 DIAGNOSIS — E119 Type 2 diabetes mellitus without complications: Secondary | ICD-10-CM

## 2019-07-30 DIAGNOSIS — M545 Low back pain: Secondary | ICD-10-CM

## 2019-07-30 DIAGNOSIS — Z9181 History of falling: Secondary | ICD-10-CM | POA: Diagnosis not present

## 2019-07-30 DIAGNOSIS — Z87891 Personal history of nicotine dependence: Secondary | ICD-10-CM

## 2019-07-30 DIAGNOSIS — R1310 Dysphagia, unspecified: Secondary | ICD-10-CM

## 2019-07-30 DIAGNOSIS — Z7902 Long term (current) use of antithrombotics/antiplatelets: Secondary | ICD-10-CM

## 2019-07-30 DIAGNOSIS — I6931 Attention and concentration deficit following cerebral infarction: Secondary | ICD-10-CM

## 2019-07-30 DIAGNOSIS — Z7984 Long term (current) use of oral hypoglycemic drugs: Secondary | ICD-10-CM

## 2019-07-30 DIAGNOSIS — I69322 Dysarthria following cerebral infarction: Secondary | ICD-10-CM

## 2019-07-30 DIAGNOSIS — Z7982 Long term (current) use of aspirin: Secondary | ICD-10-CM

## 2019-07-30 DIAGNOSIS — I69391 Dysphagia following cerebral infarction: Secondary | ICD-10-CM

## 2019-07-31 DIAGNOSIS — E782 Mixed hyperlipidemia: Secondary | ICD-10-CM | POA: Diagnosis not present

## 2019-07-31 DIAGNOSIS — I69391 Dysphagia following cerebral infarction: Secondary | ICD-10-CM | POA: Diagnosis not present

## 2019-07-31 DIAGNOSIS — E119 Type 2 diabetes mellitus without complications: Secondary | ICD-10-CM | POA: Diagnosis not present

## 2019-07-31 DIAGNOSIS — I6931 Attention and concentration deficit following cerebral infarction: Secondary | ICD-10-CM | POA: Diagnosis not present

## 2019-07-31 DIAGNOSIS — I69322 Dysarthria following cerebral infarction: Secondary | ICD-10-CM | POA: Diagnosis not present

## 2019-07-31 DIAGNOSIS — R1310 Dysphagia, unspecified: Secondary | ICD-10-CM | POA: Diagnosis not present

## 2019-07-31 DIAGNOSIS — Z7902 Long term (current) use of antithrombotics/antiplatelets: Secondary | ICD-10-CM | POA: Diagnosis not present

## 2019-07-31 DIAGNOSIS — I10 Essential (primary) hypertension: Secondary | ICD-10-CM | POA: Diagnosis not present

## 2019-07-31 DIAGNOSIS — Z9181 History of falling: Secondary | ICD-10-CM | POA: Diagnosis not present

## 2019-07-31 DIAGNOSIS — M545 Low back pain: Secondary | ICD-10-CM | POA: Diagnosis not present

## 2019-08-06 DIAGNOSIS — Z7902 Long term (current) use of antithrombotics/antiplatelets: Secondary | ICD-10-CM | POA: Diagnosis not present

## 2019-08-06 DIAGNOSIS — I10 Essential (primary) hypertension: Secondary | ICD-10-CM | POA: Diagnosis not present

## 2019-08-06 DIAGNOSIS — E782 Mixed hyperlipidemia: Secondary | ICD-10-CM | POA: Diagnosis not present

## 2019-08-06 DIAGNOSIS — Z9181 History of falling: Secondary | ICD-10-CM | POA: Diagnosis not present

## 2019-08-06 DIAGNOSIS — R1310 Dysphagia, unspecified: Secondary | ICD-10-CM | POA: Diagnosis not present

## 2019-08-06 DIAGNOSIS — E119 Type 2 diabetes mellitus without complications: Secondary | ICD-10-CM | POA: Diagnosis not present

## 2019-08-06 DIAGNOSIS — I69322 Dysarthria following cerebral infarction: Secondary | ICD-10-CM | POA: Diagnosis not present

## 2019-08-06 DIAGNOSIS — I6931 Attention and concentration deficit following cerebral infarction: Secondary | ICD-10-CM | POA: Diagnosis not present

## 2019-08-06 DIAGNOSIS — M545 Low back pain: Secondary | ICD-10-CM | POA: Diagnosis not present

## 2019-08-06 DIAGNOSIS — I69391 Dysphagia following cerebral infarction: Secondary | ICD-10-CM | POA: Diagnosis not present

## 2019-08-10 ENCOUNTER — Other Ambulatory Visit: Payer: Self-pay | Admitting: Family Medicine

## 2019-08-13 ENCOUNTER — Other Ambulatory Visit: Payer: Self-pay | Admitting: Family Medicine

## 2019-08-13 ENCOUNTER — Telehealth: Payer: Self-pay | Admitting: Family Medicine

## 2019-08-13 DIAGNOSIS — Z9181 History of falling: Secondary | ICD-10-CM | POA: Diagnosis not present

## 2019-08-13 DIAGNOSIS — I69322 Dysarthria following cerebral infarction: Secondary | ICD-10-CM | POA: Diagnosis not present

## 2019-08-13 DIAGNOSIS — Z7902 Long term (current) use of antithrombotics/antiplatelets: Secondary | ICD-10-CM | POA: Diagnosis not present

## 2019-08-13 DIAGNOSIS — E119 Type 2 diabetes mellitus without complications: Secondary | ICD-10-CM | POA: Diagnosis not present

## 2019-08-13 DIAGNOSIS — R1310 Dysphagia, unspecified: Secondary | ICD-10-CM | POA: Diagnosis not present

## 2019-08-13 DIAGNOSIS — M545 Low back pain: Secondary | ICD-10-CM | POA: Diagnosis not present

## 2019-08-13 DIAGNOSIS — R69 Illness, unspecified: Secondary | ICD-10-CM | POA: Diagnosis not present

## 2019-08-13 DIAGNOSIS — E782 Mixed hyperlipidemia: Secondary | ICD-10-CM | POA: Diagnosis not present

## 2019-08-13 DIAGNOSIS — I6931 Attention and concentration deficit following cerebral infarction: Secondary | ICD-10-CM | POA: Diagnosis not present

## 2019-08-13 DIAGNOSIS — I10 Essential (primary) hypertension: Secondary | ICD-10-CM | POA: Diagnosis not present

## 2019-08-13 DIAGNOSIS — I69391 Dysphagia following cerebral infarction: Secondary | ICD-10-CM | POA: Diagnosis not present

## 2019-08-13 MED ORDER — ONETOUCH VERIO VI STRP: ORAL_STRIP | 1 refills | Status: DC

## 2019-08-13 NOTE — Telephone Encounter (Signed)
Home Health Verbal Orders - Caller/Agency: [place] Number: [phone] Requesting OT/PT/Skilled Nursing/Social Work/Speech Therapy: Heart rate reported at 52, pt needs diabetic testing strips for glucometer. OneTouch Verio

## 2019-08-13 NOTE — Telephone Encounter (Signed)
Medication Refill - Medication: glucose blood (ONETOUCH VERIO) test strip    Has the patient contacted their pharmacy? Yes.   (Agent: If no, request that the patient contact the pharmacy for the refill.) (Agent: If yes, when and what did the pharmacy advise?)  Preferred Pharmacy (with phone number or street name):  [facility], [place] [address]  Phone: [phone] Fax: [phone]     Agent: Please be advised that RX refills may take up to 3 business days. We ask that you follow-up with your pharmacy.

## 2019-08-14 DIAGNOSIS — I10 Essential (primary) hypertension: Secondary | ICD-10-CM | POA: Diagnosis not present

## 2019-08-14 DIAGNOSIS — I69322 Dysarthria following cerebral infarction: Secondary | ICD-10-CM | POA: Diagnosis not present

## 2019-08-14 DIAGNOSIS — I6931 Attention and concentration deficit following cerebral infarction: Secondary | ICD-10-CM | POA: Diagnosis not present

## 2019-08-14 DIAGNOSIS — E119 Type 2 diabetes mellitus without complications: Secondary | ICD-10-CM | POA: Diagnosis not present

## 2019-08-14 DIAGNOSIS — E782 Mixed hyperlipidemia: Secondary | ICD-10-CM | POA: Diagnosis not present

## 2019-08-14 DIAGNOSIS — M545 Low back pain: Secondary | ICD-10-CM | POA: Diagnosis not present

## 2019-08-14 DIAGNOSIS — R1310 Dysphagia, unspecified: Secondary | ICD-10-CM | POA: Diagnosis not present

## 2019-08-14 DIAGNOSIS — I69391 Dysphagia following cerebral infarction: Secondary | ICD-10-CM | POA: Diagnosis not present

## 2019-08-14 DIAGNOSIS — Z7902 Long term (current) use of antithrombotics/antiplatelets: Secondary | ICD-10-CM | POA: Diagnosis not present

## 2019-08-14 DIAGNOSIS — Z9181 History of falling: Secondary | ICD-10-CM | POA: Diagnosis not present

## 2019-08-14 NOTE — Telephone Encounter (Signed)
Okay for verbal orders? Please advise 

## 2019-08-17 ENCOUNTER — Ambulatory Visit: Payer: Self-pay

## 2019-08-17 ENCOUNTER — Telehealth: Payer: Self-pay | Admitting: Family Medicine

## 2019-08-17 DIAGNOSIS — I10 Essential (primary) hypertension: Secondary | ICD-10-CM | POA: Diagnosis not present

## 2019-08-17 DIAGNOSIS — I6931 Attention and concentration deficit following cerebral infarction: Secondary | ICD-10-CM | POA: Diagnosis not present

## 2019-08-17 DIAGNOSIS — I69322 Dysarthria following cerebral infarction: Secondary | ICD-10-CM | POA: Diagnosis not present

## 2019-08-17 DIAGNOSIS — E119 Type 2 diabetes mellitus without complications: Secondary | ICD-10-CM | POA: Diagnosis not present

## 2019-08-17 DIAGNOSIS — I69391 Dysphagia following cerebral infarction: Secondary | ICD-10-CM | POA: Diagnosis not present

## 2019-08-17 DIAGNOSIS — M545 Low back pain: Secondary | ICD-10-CM | POA: Diagnosis not present

## 2019-08-17 DIAGNOSIS — Z9181 History of falling: Secondary | ICD-10-CM | POA: Diagnosis not present

## 2019-08-17 DIAGNOSIS — E782 Mixed hyperlipidemia: Secondary | ICD-10-CM | POA: Diagnosis not present

## 2019-08-17 DIAGNOSIS — Z7902 Long term (current) use of antithrombotics/antiplatelets: Secondary | ICD-10-CM | POA: Diagnosis not present

## 2019-08-17 DIAGNOSIS — R1310 Dysphagia, unspecified: Secondary | ICD-10-CM | POA: Diagnosis not present

## 2019-08-17 NOTE — Telephone Encounter (Signed)
Home health RN  Glenard Haring [place] to report that patient BS today when checked was 327.  Glenard Haring report that at 1715 she had the patient take her glipizide. Patient is with her daughter. Per daughter Ms Lafata has not been taking her actos.  She will pick it up tonight from the pharmacy. She is asking what additional medication her mother should take tonight. Per On Call Dr Larose Kells patients daughter Mateo Flow was told to have her mother take her evening dose of Actos and Metformin.  Per Dr Larose Kells patient should go to the ER if BS recheck is 400or above. Patient daughter verbalized understanding of all instructions. Per Home Health nurse patient is noncompliant with blood sugar checks and diet as well as medications.  She states that teaching has been reinforced.  Reason for Disposition . [1] Caller has URGENT medication question about med that PCP or specialist prescribed AND [2] triager unable to answer question  Answer Assessment - Initial Assessment Questions 1. REASON FOR CALL or QUESTION: "What is your reason for calling today?" or "How can I best help you?" or "What question do you have that I can help answer?"     Home health RN reporting a blood glucose of 327.  Protocols used: MEDICATION QUESTION CALL-A-AH, INFORMATION ONLY CALL-A-AH

## 2019-08-17 NOTE — Telephone Encounter (Signed)
Yes okay for all these orders

## 2019-08-17 NOTE — Telephone Encounter (Signed)
Verbal orders given to [facility].

## 2019-08-17 NOTE — Telephone Encounter (Signed)
Angel from [facility] called stating patients heart rate was 50-53 Angel listened to patients heart for one whole minute and she states it was 55.  Glenard Haring wanted to let PCP know Call back [phone]

## 2019-08-18 ENCOUNTER — Encounter: Payer: Self-pay | Admitting: Family Medicine

## 2019-08-18 ENCOUNTER — Other Ambulatory Visit: Payer: Self-pay

## 2019-08-18 ENCOUNTER — Ambulatory Visit (INDEPENDENT_AMBULATORY_CARE_PROVIDER_SITE_OTHER): Payer: Medicare HMO | Admitting: Family Medicine

## 2019-08-18 VITALS — BP 132/80 | HR 58 | Temp 97.8°F | Wt 249.0 lb

## 2019-08-18 DIAGNOSIS — R600 Localized edema: Secondary | ICD-10-CM | POA: Diagnosis not present

## 2019-08-18 DIAGNOSIS — E119 Type 2 diabetes mellitus without complications: Secondary | ICD-10-CM

## 2019-08-18 DIAGNOSIS — Z23 Encounter for immunization: Secondary | ICD-10-CM

## 2019-08-18 DIAGNOSIS — R001 Bradycardia, unspecified: Secondary | ICD-10-CM

## 2019-08-18 DIAGNOSIS — I10 Essential (primary) hypertension: Secondary | ICD-10-CM

## 2019-08-18 NOTE — Telephone Encounter (Signed)
Noted. FYI 

## 2019-08-18 NOTE — Telephone Encounter (Signed)
FYI

## 2019-08-18 NOTE — Progress Notes (Signed)
   Subjective:    Patient ID: [name], female    DOB: 08-14-1946, 73 y.o.   MRN: [id number]  HPI Here with her daughter to follow up on HTN and diabetes. She feels well in general but she does complain of ankle swelling. No chest pain or SOB. Her BP has been stable in the range of 130s to 150s over 90s. Her pulse rate has been low in the 50s. She denies any lightheadedness or dizziness. She has gained a fair amount of weight and she admits to not watching her diet very closely. Her glucoses have been creeping up to the range of 130 to 180 fasting. Her last A1c on 06-27-19 was up to 7.6.    Review of Systems  Constitutional: Negative.   Respiratory: Negative.   Cardiovascular: Positive for leg swelling. Negative for chest pain and palpitations.  Gastrointestinal: Negative.   Genitourinary: Negative.        Objective:   Physical Exam Constitutional:      Appearance: Normal appearance. She is obese.  Cardiovascular:     Rate and Rhythm: Normal rate and regular rhythm.     Pulses: Normal pulses.     Heart sounds: Normal heart sounds.  Pulmonary:     Effort: Pulmonary effort is normal.     Breath sounds: Normal breath sounds.  Musculoskeletal:     Comments: 3+ edema in both feet and lower legs   Neurological:     Mental Status: She is alert.           Assessment & Plan:  Her BP is stable but her pulse rate is low, so we will decrease the dose of her beta blocker. Decrease Metoprolol succinate to 1/2 tab (25 mg) daily. To balance this we will increase the Spironolactone to 2 tabs (50 mg) daily. This may help with edema as well. As for her diabetes, we had a discussion about her diet. She will focus on reducing starchy foods like bread, pasta, and potatoes, and we will keep her medications the same. Recheck an A1c in 4-6 weeks.  Alysia Penna, MD

## 2019-08-21 DIAGNOSIS — Z9181 History of falling: Secondary | ICD-10-CM | POA: Diagnosis not present

## 2019-08-21 DIAGNOSIS — I10 Essential (primary) hypertension: Secondary | ICD-10-CM | POA: Diagnosis not present

## 2019-08-21 DIAGNOSIS — E782 Mixed hyperlipidemia: Secondary | ICD-10-CM | POA: Diagnosis not present

## 2019-08-21 DIAGNOSIS — R1310 Dysphagia, unspecified: Secondary | ICD-10-CM | POA: Diagnosis not present

## 2019-08-21 DIAGNOSIS — I6931 Attention and concentration deficit following cerebral infarction: Secondary | ICD-10-CM | POA: Diagnosis not present

## 2019-08-21 DIAGNOSIS — M545 Low back pain: Secondary | ICD-10-CM | POA: Diagnosis not present

## 2019-08-21 DIAGNOSIS — I69391 Dysphagia following cerebral infarction: Secondary | ICD-10-CM | POA: Diagnosis not present

## 2019-08-21 DIAGNOSIS — E119 Type 2 diabetes mellitus without complications: Secondary | ICD-10-CM | POA: Diagnosis not present

## 2019-08-21 DIAGNOSIS — I69322 Dysarthria following cerebral infarction: Secondary | ICD-10-CM | POA: Diagnosis not present

## 2019-08-21 DIAGNOSIS — Z7902 Long term (current) use of antithrombotics/antiplatelets: Secondary | ICD-10-CM | POA: Diagnosis not present

## 2019-08-25 DIAGNOSIS — Z7902 Long term (current) use of antithrombotics/antiplatelets: Secondary | ICD-10-CM | POA: Diagnosis not present

## 2019-08-25 DIAGNOSIS — M545 Low back pain: Secondary | ICD-10-CM | POA: Diagnosis not present

## 2019-08-25 DIAGNOSIS — I6931 Attention and concentration deficit following cerebral infarction: Secondary | ICD-10-CM | POA: Diagnosis not present

## 2019-08-25 DIAGNOSIS — E782 Mixed hyperlipidemia: Secondary | ICD-10-CM | POA: Diagnosis not present

## 2019-08-25 DIAGNOSIS — R1310 Dysphagia, unspecified: Secondary | ICD-10-CM | POA: Diagnosis not present

## 2019-08-25 DIAGNOSIS — Z9181 History of falling: Secondary | ICD-10-CM | POA: Diagnosis not present

## 2019-08-25 DIAGNOSIS — E119 Type 2 diabetes mellitus without complications: Secondary | ICD-10-CM | POA: Diagnosis not present

## 2019-08-25 DIAGNOSIS — I69322 Dysarthria following cerebral infarction: Secondary | ICD-10-CM | POA: Diagnosis not present

## 2019-08-25 DIAGNOSIS — I10 Essential (primary) hypertension: Secondary | ICD-10-CM | POA: Diagnosis not present

## 2019-08-25 DIAGNOSIS — I69391 Dysphagia following cerebral infarction: Secondary | ICD-10-CM | POA: Diagnosis not present

## 2019-08-31 ENCOUNTER — Other Ambulatory Visit: Payer: Self-pay | Admitting: Family Medicine

## 2019-09-17 ENCOUNTER — Other Ambulatory Visit: Payer: Self-pay | Admitting: Family Medicine

## 2019-09-21 ENCOUNTER — Other Ambulatory Visit: Payer: Self-pay

## 2019-09-21 ENCOUNTER — Encounter: Payer: Self-pay | Admitting: Family Medicine

## 2019-09-21 ENCOUNTER — Ambulatory Visit (INDEPENDENT_AMBULATORY_CARE_PROVIDER_SITE_OTHER): Payer: Medicare HMO | Admitting: Family Medicine

## 2019-09-21 VITALS — BP 130/78 | HR 73 | Temp 98.4°F | Wt 245.4 lb

## 2019-09-21 DIAGNOSIS — E1142 Type 2 diabetes mellitus with diabetic polyneuropathy: Secondary | ICD-10-CM | POA: Diagnosis not present

## 2019-09-21 DIAGNOSIS — I10 Essential (primary) hypertension: Secondary | ICD-10-CM

## 2019-09-21 DIAGNOSIS — R600 Localized edema: Secondary | ICD-10-CM | POA: Diagnosis not present

## 2019-09-21 DIAGNOSIS — E1140 Type 2 diabetes mellitus with diabetic neuropathy, unspecified: Secondary | ICD-10-CM | POA: Diagnosis not present

## 2019-09-21 MED ORDER — FUROSEMIDE 20 MG PO TABS: 20.0000 mg | ORAL_TABLET | Freq: Every day | ORAL | 5 refills | Status: DC

## 2019-09-21 MED ORDER — AMLODIPINE BESYLATE 5 MG PO TABS: 5.0000 mg | ORAL_TABLET | Freq: Every day | ORAL | 5 refills | Status: DC

## 2019-09-21 NOTE — Progress Notes (Signed)
   Subjective:    Patient ID: [name], female    DOB: 1946-10-16, 73 y.o.   MRN: [id number]  HPI Here to discuss worsening swelling in both feet and lower legs. No SOB. As noted at our last visit, in August she was shown to have a normal creatinine and a normal EF by ECHO. Her BP has been stable at home. The swelling is a little worse and she complains of aching pains in the feet.    Review of Systems  Constitutional: Negative.   Respiratory: Negative.   Cardiovascular: Positive for leg swelling. Negative for chest pain and palpitations.       Objective:   Physical Exam Constitutional:      Appearance: Normal appearance.  Cardiovascular:     Rate and Rhythm: Normal rate and regular rhythm.     Pulses: Normal pulses.     Heart sounds: Normal heart sounds.  Pulmonary:     Effort: Pulmonary effort is normal.     Breath sounds: Normal breath sounds.  Musculoskeletal:     Comments: 3+ edema in both lower legs and feet   Neurological:     Mental Status: She is alert.           Assessment & Plan:  Her HTN is well controlled but her leg edema is worse. I think this is contributed to by her high dose Amlodipine and the Pioglitazone. We will decrease the Amlodipine to 5 mg daily and add Lasix 20 mg each morning. We will also stop the Pioglitazone. She will follow up with [place] in 3-4 weeks. Refer to Endocrine for the diabetes. She also has some neuropathy in the legs and feet, and we may need to start her on some Gabapentin at some point.  Alysia Penna, MD

## 2019-09-23 ENCOUNTER — Encounter: Payer: Self-pay | Admitting: Family Medicine

## 2019-09-23 DIAGNOSIS — Z1231 Encounter for screening mammogram for malignant neoplasm of breast: Secondary | ICD-10-CM | POA: Diagnosis not present

## 2019-10-01 DIAGNOSIS — R69 Illness, unspecified: Secondary | ICD-10-CM | POA: Diagnosis not present

## 2019-10-10 ENCOUNTER — Other Ambulatory Visit: Payer: Self-pay | Admitting: Family Medicine

## 2019-10-15 ENCOUNTER — Ambulatory Visit: Payer: Medicare HMO | Admitting: Neurology

## 2019-10-15 ENCOUNTER — Other Ambulatory Visit: Payer: Self-pay

## 2019-10-15 ENCOUNTER — Encounter: Payer: Self-pay | Admitting: Neurology

## 2019-10-15 VITALS — BP 147/69 | HR 86 | Temp 97.2°F | Wt 242.0 lb

## 2019-10-15 DIAGNOSIS — I639 Cerebral infarction, unspecified: Secondary | ICD-10-CM | POA: Diagnosis not present

## 2019-10-15 NOTE — Patient Instructions (Signed)
I had a long d/w patient about her recent stroke, risk for recurrent stroke/TIAs, personally independently reviewed imaging studies and stroke evaluation results and answered questions.Continue aspirin 81 mg daily  for secondary stroke prevention and maintain strict control of hypertension with blood pressure goal below 130/90, diabetes with hemoglobin A1c goal below 6.5% and lipids with LDL cholesterol goal below 70 mg/dL. I also advised the patient to eat a healthy diet with plenty of whole grains, cereals, fruits and vegetables, exercise regularly and maintain ideal body weight Followup in the future with my nurse practitioner [name] in 6 months or call earlier if necessary.

## 2019-10-15 NOTE — Progress Notes (Signed)
[facility] [address] [place]. [place] [address] [phone]       OFFICE FOLLOW UP VISIT NOTE  [name] Date of Birth:  Oct 06, 1946 Medical Record Number:  [id number]   Referring MD: Alysia Penna  Reason for Referral: Stroke  GMW:[id number] visit 07/21/2019 : [name] is a 73 year old African-American lady seen today for initial office consultation visit for stroke.  She is accompanied by her daughter.  History is obtained from them, review of electronic medical records and have personally reviewed imaging films in PACS.  [name] is a 73 year old pleasant African-American lady with past medical history of diabetes, prior hypertension, hyperlipidemia presented with sudden onset of expressive language difficulties and dysarthria on 06/27/2019.  CT scan of the head on admission showed multiple lacunar infarcts of chronic and possibly subacute ages however MRI scan revealed an acute nonhemorrhagic left anterior cerebral artery infarct involving left parasagittal frontoparietal region.  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  MRI of the brain showed no significant large vessel stenosis or occlusion.  MRA of the neck was read as showing 50% right ICA stenosis but per my review there is clearly no significant stenosis noted.  This was confirmed on carotid ultrasound which shows bilateral only 1-39% stenosis.  Transthoracic echo showed normal ejection fraction with severe left ventricular hypertrophy and aortic valve calcification.  Hemoglobin A1c was elevated at 7.6.  LDL cholesterol was 89 mg percent.  Patient was started on dual antiplatelet therapy as well as seen by physical occupational and speech therapy.  She was discharged home and has made gradual improvement.  She is finished speech and physical therapy.  She still has some word hesitancy and trouble speaking but is gradually getting better.  She is now able to  ambulate with a wheeled walker.  She is pretty safe and has no falls or injuries.  She feels chronic knee pain from arthritis despite knee replacement is the more reason for her walker.  She is tolerating aspirin Plavix well without bruising or bleeding.  She states her blood pressures under good control.  Patient is working with primary physician to get her sugar and cholesterol under better control.  She has no new complaints today.  She is living at home with daughter providing support.  She had no prior known history of stroke and her right occipital infarct is apparently clinically silent as she does not have peripheral visual field loss.  She denies any history of atrial fibrillation, palpitations, cardiac arrhythmias or significant cardiac disease. Update 10/15/2019 : She returns for follow-up after last visit 2-1/2 months ago.  She states she is doing well.  She has had no recurrent stroke or TIA symptoms.  She has noticed improvement in her right-sided strength.  She can walk short distances by herself but uses a 4 pronged cane for long distances.  She has had no falls or injuries.  She is stopped Plavix and remains on aspirin which is tolerating well without bruising or bleeding.  Blood pressure is well controlled.  She states her fasting sugars range from 110-140.  She is tolerating Crestor without muscle aches and pains.  She had no new complaints. ROS:   14 system review of systems is positive for speech difficulty, word hesitancy, gait difficulty, balance problems, knee pain, joint pain, hand weakness and all other systems negative PMH:  Past Medical History:  Diagnosis Date   Acute bronchitis 07/22/2008  Diabetes mellitus without complication ([place]) 03/09/2439   HYPERLIPIDEMIA, MIXED 07/18/2007   Hypertension     Social History:  Social History   Socioeconomic History   Marital status: Widowed    Spouse name: Not on file   Number of children: 0   Years of education: Not on file    Highest education level: Not on file  Occupational History   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  Social Needs   Financial resource strain: Not hard at all   Food insecurity    Worry: Never true    Inability: Never true   Transportation needs    Medical: No    Non-medical: No  Tobacco Use   Smoking status: Former Smoker    Packs/day: 0.50    Years: 48.00    Pack years: 24.00    Types: Cigarettes    Start date: 11/26/1968   Smokeless tobacco: Never Used   Tobacco comment: trying to quit;  not really motivated at present  Substance and Sexual Activity   Alcohol use: Not Currently    Alcohol/week: 0.0 standard drinks    Comment: rare   Drug use: No   Sexual activity: Not on file  Lifestyle   Physical activity    Days per week: 0 days    Minutes per session: 0 min   Stress: Not at all  Relationships   Social connections    Talks on phone: Twice a week    Gets together: Once a week    Attends religious service: More than 4 times per year    Active member of club or organization: No    Attends meetings of clubs or organizations: Never    Relationship status: Widowed   Intimate partner violence    Fear of current or ex partner: Not on file    Emotionally abused: Not on file    Physically abused: Not on file    Forced sexual activity: Not on file  Other Topics Concern   Not on file  Social History Narrative   01/23/2019:    Lives with "great-grandson" in one level with new dog, Brandon.    Hx raising of her deceased sister's 3 children, and is now raising/supporting 10yo  'great-grandson'.    Attends church, church community as support system. Many of her family has passed away.   Enjoys watching TV; used to enjoy going fishing          Medications:   Current Outpatient Medications on File Prior to Visit  Medication Sig Dispense Refill   acetaminophen (TYLENOL) 325 MG tablet Take 650 mg by mouth every 6 (six) hours as needed for headache (pain).     Alcohol  Swabs (ALCOHOL PREP) PADS Use to test 1 X day and diagnosis code is E 11.9 100 each 1   amLODipine (NORVASC) 5 MG tablet Take 1 tablet (5 mg total) by mouth daily. 30 tablet 5   aspirin 81 MG chewable tablet Chew 1 tablet (81 mg total) by mouth daily. 30 tablet 3   diclofenac (VOLTAREN) 75 MG EC tablet Take 1 tablet by mouth twice daily 60 tablet 0   furosemide (LASIX) 20 MG tablet Take 1 tablet (20 mg total) by mouth daily. 30 tablet 5   glipiZIDE (GLUCOTROL) 10 MG tablet TAKE 1 TABLET BY MOUTH TWICE DAILY BEFORE  A  MEAL (Patient taking differently: Take 10 mg by mouth 2 (two) times daily before a meal. ) 180 tablet 0   glucose blood (ONETOUCH VERIO) test strip Test  1 x day and diagnosis code is E 11.9 100 each 1   Lancets (ONETOUCH ULTRASOFT) lancets Test 1 X day and diagnosis code is E 11.9 100 each 3   lisinopril (ZESTRIL) 10 MG tablet Take 2 tablets (20 mg total) by mouth daily. 60 tablet 3   metFORMIN (GLUCOPHAGE) 500 MG tablet TAKE 1 TABLET BY MOUTH TWICE DAILY WITH MEALS 180 tablet 0   metoprolol succinate (TOPROL-XL) 50 MG 24 hr tablet Take 1 tablet (50 mg total) by mouth daily. Take with or immediately following a meal. 30 tablet 3   rosuvastatin (CRESTOR) 40 MG tablet TAKE 1 TABLET BY MOUTH ONCE DAILY *PATIENT NEEDS OFFICE VISIT FOR FUTURE REFILLS* 30 tablet 0   spironolactone (ALDACTONE) 25 MG tablet Take 1 tablet (25 mg total) by mouth daily. 90 tablet 0   No current facility-administered medications on file prior to visit.     Allergies:   Allergies  Allergen Reactions   Hydromet [Hydrocodone-Homatropine] Itching    Physical Exam General: Obese elderly African-American lady, seated, in no evident distress Head: head normocephalic and atraumatic.   Neck: supple with no carotid or supraclavicular bruits Cardiovascular: regular rate and rhythm, no murmurs Musculoskeletal: no deformity Skin:  no rash/petichiae Vascular:  Normal pulses all  extremities  Neurologic Exam Mental Status: Awake and fully alert. Oriented to place and time. Recent and remote memory intact. Attention span, concentration and fund of knowledge appropriate. Mood and affect appropriate.  Cranial Nerves: Fundoscopic exam reveals sharp disc margins. Pupils equal, briskly reactive to light. Extraocular movements full without nystagmus. Visual fields full to confrontation. Hearing intact. Facial sensation intact.  Mild right lower nasolabial fold asymmetry when she smiles., tongue, palate moves normally and symmetrically.  Motor: Normal bulk and tone. Normal strength in all tested extremity muscles except diminished fine finger movements on the right, mild right grip and hip flexor weakness.  Orbits left over right upper extremity.. Sensory.: intact to touch , pinprick , position and vibratory sensation.  Coordination: Rapid alternating movements normal in all extremities. Finger-to-nose and heel-to-shin performed accurately bilaterally. Gait and Station: Arises from chair without difficulty. Stance is normal. Gait demonstrates normal stride length and balance . Able to heel, toe and tandem walk without difficulty.  Reflexes: 1+ and symmetric. Toes downgoing.   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    PLAN: I had a long d/w patient about her recent stroke, risk for recurrent stroke/TIAs, personally independently reviewed imaging studies and stroke evaluation results and answered questions.Continue aspirin 81 mg daily  for secondary stroke prevention and maintain strict control of hypertension with blood pressure goal below 130/90, diabetes with hemoglobin A1c  goal below 6.5% and lipids with LDL cholesterol goal below 70 mg/dL. I also advised the patient to eat a healthy diet with plenty of whole grains, cereals, fruits and vegetables, exercise regularly and maintain ideal body weight Followup in the future with my nurse practitioner Janett Billow in 6 months or call earlier if necessary.greater than 50% time during this 25-minute   visit was spent on counseling and coordination of care about her recurrent embolic cryptogenic strokes and discussion about evaluation and treatment and answering questions. Antony Contras, MD  [facility] [address] [place], [place]  [address]  Phone [phone] Fax [phone] Note: This document was prepared with digital dictation and possible smart phrase technology. Any transcriptional errors that result from this process are unintentional.

## 2019-10-19 ENCOUNTER — Other Ambulatory Visit: Payer: Self-pay

## 2019-10-21 ENCOUNTER — Ambulatory Visit (INDEPENDENT_AMBULATORY_CARE_PROVIDER_SITE_OTHER): Payer: Medicare HMO | Admitting: Endocrinology

## 2019-10-21 ENCOUNTER — Other Ambulatory Visit: Payer: Self-pay

## 2019-10-21 ENCOUNTER — Encounter: Payer: Self-pay | Admitting: Endocrinology

## 2019-10-21 VITALS — BP 120/60 | HR 56 | Temp 97.4°F | Ht 65.0 in | Wt 240.0 lb

## 2019-10-21 DIAGNOSIS — E1140 Type 2 diabetes mellitus with diabetic neuropathy, unspecified: Secondary | ICD-10-CM

## 2019-10-21 LAB — POCT GLYCOSYLATED HEMOGLOBIN (HGB A1C): Hemoglobin A1C: 6.1 % — AB (ref 4.0–5.6)

## 2019-10-21 MED ORDER — GLIPIZIDE 5 MG PO TABS: 5.0000 mg | ORAL_TABLET | Freq: Every day | ORAL | 11 refills | Status: DC

## 2019-10-21 NOTE — Progress Notes (Signed)
Subjective:    Patient ID: [name], female    DOB: 16-Sep-1946, 73 y.o.   MRN: [id number]  HPI pt is referred by Dr Sarajane Jews, for diabetes.  Pt states DM was dx'ed in 2009; she has mild neuropathy of the lower extremities, and associated CVA; she has never been on insulin; pt says her diet and exercise are fair; she has never had GDM (G0), pancreatitis, pancreatic surgery, severe hypoglycemia or DKA.  She does not check cbg's.     Review of Systems denies blurry vision, headache, chest pain, sob, n/v, muscle cramps, excessive diaphoresis, memory loss, depression, cold intolerance, rhinorrhea, and easy bruising.  She has weight gain and frequent urination.      Objective:   Physical Exam VS: see vs page GEN: no distress.  In wheelchair HEAD: head: no deformity eyes: no periorbital swelling, no proptosis external nose and ears are normal NECK: supple, thyroid is not enlarged CHEST WALL: no deformity LUNGS: clear to auscultation CV: reg rate and rhythm, no murmur ABD: abdomen is soft, nontender.  no hepatosplenomegaly.  not distended.  no hernia MUSCULOSKELETAL: muscle bulk and strength are grossly normal.  no obvious joint swelling.  gait is not tested EXTEMITIES: no deformity.  no ulcer on the feet.  feet are of normal color and temp.  1+ bilat edema PULSES: dorsalis pedis intact bilat.  no carotid bruit NEURO:  cn 2-12 grossly intact.   readily moves all 4's.  sensation is intact to touch on the feet SKIN:  Normal texture and temperature.  No rash or suspicious lesion is visible.   NODES:  None palpable at the neck PSYCH: alert, well-oriented.  Does not appear anxious nor depressed.  A1c=6.1%  Lab Results  Component Value Date   CREATININE 0.97 06/29/2019   BUN 19 06/29/2019   NA 141 06/29/2019   K 4.3 06/29/2019   CL 108 06/29/2019   CO2 24 06/29/2019   I have reviewed outside records, and summarized: Pt was noted to have elevated a1c, and referred here.  She was  admitted with acute CVA.  Main symptom was loss of speech.        Assessment & Plan:  Type 2 DM, with CVA, new to me: overcontrolled, given this SU-containing regimen. Edema: This limits rx options  Patient Instructions  good diet and exercise significantly improve the control of your diabetes.  please let me know if you wish to be referred to a dietician.  high blood sugar is very risky to your health.  you should see an eye doctor and dentist every year.  It is very important to get all recommended vaccinations.  Controlling your blood pressure and cholesterol drastically reduces the damage diabetes does to your body.  Those who smoke should quit.  Please discuss these with your doctor.  check your blood sugar once a day.  vary the time of day when you check, between before the 3 meals, and at bedtime.  also check if you have symptoms of your blood sugar being too high or too low.  please keep a record of the readings and bring it to your next appointment here (or you can bring the meter itself).  You can write it on any piece of paper.  please call us sooner if your blood sugar goes below 70, or if you have a lot of readings over 200.  For now, please: Decrease the glipizide to 5 mg in the morninig, and none in the evening, and:  Please continue the same metformin. Please come back for a follow-up appointment in 2 months.  Please see our education the same day, to go over checking your blood sugar.

## 2019-10-21 NOTE — Patient Instructions (Addendum)
good diet and exercise significantly improve the control of your diabetes.  please let me know if you wish to be referred to a dietician.  high blood sugar is very risky to your health.  you should see an eye doctor and dentist every year.  It is very important to get all recommended vaccinations.  Controlling your blood pressure and cholesterol drastically reduces the damage diabetes does to your body.  Those who smoke should quit.  Please discuss these with your doctor.  check your blood sugar once a day.  vary the time of day when you check, between before the 3 meals, and at bedtime.  also check if you have symptoms of your blood sugar being too high or too low.  please keep a record of the readings and bring it to your next appointment here (or you can bring the meter itself).  You can write it on any piece of paper.  please call us sooner if your blood sugar goes below 70, or if you have a lot of readings over 200.  For now, please: Decrease the glipizide to 5 mg in the morninig, and none in the evening, and: Please continue the same metformin. Please come back for a follow-up appointment in 2 months.  Please see our education the same day, to go over checking your blood sugar.

## 2019-11-12 ENCOUNTER — Other Ambulatory Visit: Payer: Self-pay | Admitting: Family Medicine

## 2019-11-30 ENCOUNTER — Other Ambulatory Visit: Payer: Self-pay | Admitting: Family Medicine

## 2019-12-03 ENCOUNTER — Other Ambulatory Visit: Payer: Self-pay | Admitting: Family Medicine

## 2019-12-03 DIAGNOSIS — R69 Illness, unspecified: Secondary | ICD-10-CM | POA: Diagnosis not present

## 2019-12-08 ENCOUNTER — Other Ambulatory Visit: Payer: Self-pay | Admitting: Family Medicine

## 2019-12-22 ENCOUNTER — Other Ambulatory Visit: Payer: Self-pay

## 2019-12-24 ENCOUNTER — Ambulatory Visit: Payer: Medicare HMO | Admitting: Endocrinology

## 2019-12-24 ENCOUNTER — Encounter: Payer: Medicare HMO | Admitting: Dietician

## 2019-12-25 ENCOUNTER — Other Ambulatory Visit: Payer: Self-pay | Admitting: Family Medicine

## 2020-01-04 DIAGNOSIS — Z6841 Body Mass Index (BMI) 40.0 and over, adult: Secondary | ICD-10-CM | POA: Diagnosis not present

## 2020-01-04 DIAGNOSIS — M1990 Unspecified osteoarthritis, unspecified site: Secondary | ICD-10-CM | POA: Diagnosis not present

## 2020-01-04 DIAGNOSIS — R609 Edema, unspecified: Secondary | ICD-10-CM | POA: Diagnosis not present

## 2020-01-04 DIAGNOSIS — I10 Essential (primary) hypertension: Secondary | ICD-10-CM | POA: Diagnosis not present

## 2020-01-04 DIAGNOSIS — E785 Hyperlipidemia, unspecified: Secondary | ICD-10-CM | POA: Diagnosis not present

## 2020-01-04 DIAGNOSIS — E1140 Type 2 diabetes mellitus with diabetic neuropathy, unspecified: Secondary | ICD-10-CM | POA: Diagnosis not present

## 2020-01-04 DIAGNOSIS — R32 Unspecified urinary incontinence: Secondary | ICD-10-CM | POA: Diagnosis not present

## 2020-01-04 DIAGNOSIS — Z791 Long term (current) use of non-steroidal anti-inflammatories (NSAID): Secondary | ICD-10-CM | POA: Diagnosis not present

## 2020-01-04 DIAGNOSIS — E1165 Type 2 diabetes mellitus with hyperglycemia: Secondary | ICD-10-CM | POA: Diagnosis not present

## 2020-01-14 ENCOUNTER — Other Ambulatory Visit: Payer: Self-pay | Admitting: Family Medicine

## 2020-01-14 DIAGNOSIS — R69 Illness, unspecified: Secondary | ICD-10-CM | POA: Diagnosis not present

## 2020-01-15 DIAGNOSIS — R69 Illness, unspecified: Secondary | ICD-10-CM | POA: Diagnosis not present

## 2020-01-17 ENCOUNTER — Other Ambulatory Visit: Payer: Self-pay | Admitting: Family Medicine

## 2020-01-26 ENCOUNTER — Other Ambulatory Visit: Payer: Self-pay | Admitting: Family Medicine

## 2020-01-28 ENCOUNTER — Ambulatory Visit: Payer: Medicare HMO | Attending: Internal Medicine

## 2020-01-28 DIAGNOSIS — Z23 Encounter for immunization: Secondary | ICD-10-CM

## 2020-01-28 NOTE — Progress Notes (Signed)
   Covid-19 Vaccination Clinic  Name:  [name]    MRN: [id number] DOB: [birth year]  01/28/2020  [name] was observed post Covid-19 immunization for 15 minutes without incident. She was provided with Vaccine Information Sheet and instruction to access the V-Safe system.   [name] was instructed to call 911 with any severe reactions post vaccine: Marland Kitchen Difficulty breathing  . Swelling of face and throat  . A fast heartbeat  . A bad rash all over body  . Dizziness and weakness   Immunizations Administered    Name Date Dose VIS Date Route   Pfizer COVID-19 Vaccine 01/28/2020  4:07 PM 0.3 mL 11/06/2019 Intramuscular   Manufacturer: [place]   Lot: [id number]   [place]: [address]-5

## 2020-02-16 ENCOUNTER — Other Ambulatory Visit: Payer: Self-pay | Admitting: Family Medicine

## 2020-02-18 ENCOUNTER — Other Ambulatory Visit: Payer: Self-pay | Admitting: Family Medicine

## 2020-02-19 ENCOUNTER — Other Ambulatory Visit: Payer: Self-pay | Admitting: Family Medicine

## 2020-02-19 NOTE — Telephone Encounter (Signed)
Pt stated that she is out of both of the medications listed and needs them refilled today.   Refills have been requested for the following medications:      metoprolol succinate (TOPROL-XL) 50 MG 24 hr tablet     diclofenca (VOLTAREN) 75 MG EC tablet   Preferred pharmacy: [facility], [place]: [phone]

## 2020-02-19 NOTE — Telephone Encounter (Signed)
Patient need to schedule an ov for more refills. 

## 2020-02-22 NOTE — Telephone Encounter (Signed)
Pt is scheduled is for a CPE on May 5th at 9:00am. She would like to know if her prescriptions can be refilled until then?  Pt can be reached at [phone]

## 2020-02-24 ENCOUNTER — Ambulatory Visit: Payer: Medicare HMO | Attending: Internal Medicine

## 2020-02-24 DIAGNOSIS — Z23 Encounter for immunization: Secondary | ICD-10-CM

## 2020-02-24 NOTE — Progress Notes (Signed)
   Covid-19 Vaccination Clinic  Name:  [name]    MRN: [id number] DOB: 1946/05/15  02/24/2020  [name] was observed post Covid-19 immunization for 15 minutes without incident. She was provided with Vaccine Information Sheet and instruction to access the V-Safe system.   [name] was instructed to call 911 with any severe reactions post vaccine: Marland Kitchen Difficulty breathing  . Swelling of face and throat  . A fast heartbeat  . A bad rash all over body  . Dizziness and weakness   Immunizations Administered    Name Date Dose VIS Date Route   Pfizer COVID-19 Vaccine 02/24/2020  4:04 PM 0.3 mL 11/06/2019 Intramuscular   Manufacturer: [place]   Lot: [id number]   [place]: [id number]

## 2020-03-10 ENCOUNTER — Other Ambulatory Visit: Payer: Self-pay | Admitting: Family Medicine

## 2020-03-11 ENCOUNTER — Other Ambulatory Visit: Payer: Self-pay | Admitting: Family Medicine

## 2020-03-14 MED ORDER — ROSUVASTATIN CALCIUM 40 MG PO TABS: ORAL_TABLET | ORAL | 0 refills | Status: DC

## 2020-03-14 MED ORDER — DICLOFENAC SODIUM 75 MG PO TBEC: 75.0000 mg | DELAYED_RELEASE_TABLET | Freq: Two times a day (BID) | ORAL | 0 refills | Status: DC

## 2020-03-14 MED ORDER — LISINOPRIL 10 MG PO TABS: 20.0000 mg | ORAL_TABLET | Freq: Every day | ORAL | 0 refills | Status: DC

## 2020-03-14 MED ORDER — ONETOUCH ULTRASOFT LANCETS MISC: 0 refills | Status: AC

## 2020-03-14 MED ORDER — METFORMIN HCL 500 MG PO TABS: 500.0000 mg | ORAL_TABLET | Freq: Two times a day (BID) | ORAL | 0 refills | Status: DC

## 2020-03-14 MED ORDER — METOPROLOL SUCCINATE ER 50 MG PO TB24: 50.0000 mg | ORAL_TABLET | Freq: Every day | ORAL | 0 refills | Status: DC

## 2020-03-14 MED ORDER — AMLODIPINE BESYLATE 5 MG PO TABS: 5.0000 mg | ORAL_TABLET | Freq: Every day | ORAL | 0 refills | Status: DC

## 2020-03-22 ENCOUNTER — Other Ambulatory Visit: Payer: Self-pay

## 2020-03-24 ENCOUNTER — Encounter: Payer: Self-pay | Admitting: *Deleted

## 2020-03-24 ENCOUNTER — Other Ambulatory Visit: Payer: Self-pay | Admitting: Family Medicine

## 2020-03-24 ENCOUNTER — Other Ambulatory Visit: Payer: Self-pay | Admitting: *Deleted

## 2020-03-24 NOTE — Patient Outreach (Signed)
[facility]) Care Management  03/24/2020   GABBI WHETSTONE [birth year] [id number]   Successful telephone outreach call to patient. HIPAA identifiers obtained.  Social: Patient lives alone. She is independent with all ADLs , IADLs, and currently transports herself to most of her medical appointments unless it is during rush hour. Patient denies having any recent falls and reports that her home environment is safe. Her friend Fulton Reek would be able to provide care for the patient if she were to need assistance and emotionally the patient is doing well at this time.  Subjective:  Patient reports that she is doing well at this time. She shared that she has rehabilitated pretty well after her stroke. Reporting she would like to work towards building up her strength and would like to lose weight to become steadier on her feet and for her overall health. Patient admits that she has not been exercising and that she does overeat at times. Patient had not taken her FBS today but states it usually ranges from 115-140. Her last A1c was 7.6 and she explained she has an upcoming appointment with PCP on 03/30/20 and anticipates he will order an A1c. Patient stated that she does have B/P cuff but does not take her B/P routinely. Nurse encouraged patient to both take FBS and B/P daily and record for her health and as  preventive measures against stroke. Patient did not have any further concerns or questions.   Current Medications:  Current Outpatient Medications  Medication Sig Dispense Refill  . acetaminophen (TYLENOL) 325 MG tablet Take 650 mg by mouth every 6 (six) hours as needed for headache (pain).    . Alcohol Swabs (ALCOHOL PREP) PADS Use to test 1 X day and diagnosis code is E 11.9 100 each 1  . amLODipine (NORVASC) 5 MG tablet Take 1 tablet (5 mg total) by mouth daily. 30 tablet 0  . aspirin 81 MG chewable tablet Chew 1 tablet (81 mg total) by mouth daily. 30 tablet 3  . diclofenac  (VOLTAREN) 75 MG EC tablet Take 1 tablet (75 mg total) by mouth 2 (two) times daily. 60 tablet 0  . furosemide (LASIX) 20 MG tablet Take 1 tablet (20 mg total) by mouth daily. 30 tablet 5  . glipiZIDE (GLUCOTROL) 5 MG tablet Take 1 tablet (5 mg total) by mouth daily before breakfast. 30 tablet 11  . glucose blood (ONETOUCH VERIO) test strip Test 1 x day and diagnosis code is E 11.9 100 each 1  . Lancets (ONETOUCH ULTRASOFT) lancets USE   TO CHECK GLUCOSE ONCE DAILY 100 each 0  . lisinopril (ZESTRIL) 10 MG tablet Take 2 tablets (20 mg total) by mouth daily. 120 tablet 0  . metFORMIN (GLUCOPHAGE) 500 MG tablet Take 1 tablet (500 mg total) by mouth 2 (two) times daily with a meal. 180 tablet 0  . metoprolol succinate (TOPROL-XL) 50 MG 24 hr tablet Take 1 tablet (50 mg total) by mouth daily. Take with or immediately following a meal. 30 tablet 0  . rosuvastatin (CRESTOR) 40 MG tablet Take one tablet by mouth daily 30 tablet 0  . spironolactone (ALDACTONE) 25 MG tablet Take 1 tablet (25 mg total) by mouth daily. 90 tablet 0   No current facility-administered medications for this visit.    Functional Status:  In your present state of health, do you have any difficulty performing the following activities: 03/24/2020 06/27/2019  Hearing? N N  Vision? N N  Difficulty concentrating or making decisions?  N N  Walking or climbing stairs? N N  Dressing or bathing? N N  Doing errands, shopping? N N  Preparing Food and eating ? N -  Using the Toilet? N -  In the past six months, have you accidently leaked urine? Y -  Comment occassionally if I wait too long -  Do you have problems with loss of bowel control? Y -  Comment Occassionally, it may come out a little -  Managing your Medications? N -  Managing your Finances? N -  Housekeeping or managing your Housekeeping? N -  Some recent data might be hidden    Fall/Depression Screening: Fall Risk  03/24/2020 07/21/2019 01/23/2019  Falls in the past year? 0  0 0  Comment - - -  Number falls in past yr: 0 - -  Injury with Fall? 0 - -  Risk for fall due to : Impaired balance/gait - History of fall(s)  Follow up Falls prevention discussed;Education provided;Falls evaluation completed - Falls prevention discussed   PHQ 2/9 Scores 03/24/2020 01/23/2019 01/15/2018 02/14/2017 11/28/2016 07/23/2016 10/11/2014  PHQ - 2 Score 0 0 0 0 0 0 0  PHQ- 9 Score - 1 - - - - -   [name] CM Care Plan Problem One     Most Recent Value  Care Plan Problem One  Knowledge deficient related to diabetes condition, treatment plan, and lifestyle changes as evidenced by patient's request for diabetes education  Role Documenting the Problem One  [place] for Problem One  Active  [name] Long Term Goal   Patient will report reduction in A1c by 0.5-1 points within the next 90 days.  [name] Long Term Goal Start Date  03/24/20  Interventions for Problem One Long Term Goal  RN encouraged patient to continue with daily FBS monitoring and record data, encouraged medication adherence, discussed reflecting back to diet when FBS values are high, sent Diabetes information packet, sent calendar booklet to record values, encouraged patient to follow diabetic and low sodium diet.  [name] CM Short Term Goal #1   Patient will start to exercise x2 weekly and lose 1-2 pounds within the next 30 days  [name] CM Short Term Goal #1 Start Date  03/24/20  Interventions for Short Term Goal #1  Patient has a goal of losing weight and improving strength. RN discussed how exercise relates to diabetes, maintaining strength, and weight loss, discussed weight management, encouraged patient to use portion control and maintain a healthy diet, sent EMMI Weight loss tips and chair exercise printouts.  [name] CM Short Term Goal #2   Patient will take her B/P daily and record within 30 days  [name] CM Short Term Goal #2 Start Date  03/24/20  Interventions for Short Term Goal #2  Nurse educated about the importance of monitoring  B/P daily and recording values as a preventative measure against stroke, encouraged patient to take her B/P daily and record, discussed whatching You Tube video regarding how to take her B/P as patient states she was confused about how to use equipment but states she does know how to use and whatch videos on You Tube      Interventions: [place] provided education regarding diabetes, hypertension, weight loss, and stroke prevention.   Plan: RN Health Coach will send introductory letters to provider and patient, will send diabetes educational materials to patient, and will follow-up with patient within the month of June. Patient agrees to future outreach calls.   Sharee Pimple  Lakeeta Dobosz RN, [place] [phone] [email or web address]

## 2020-03-25 DIAGNOSIS — R69 Illness, unspecified: Secondary | ICD-10-CM | POA: Diagnosis not present

## 2020-03-28 DIAGNOSIS — R69 Illness, unspecified: Secondary | ICD-10-CM | POA: Diagnosis not present

## 2020-03-30 ENCOUNTER — Other Ambulatory Visit: Payer: Self-pay

## 2020-03-30 ENCOUNTER — Encounter: Payer: Self-pay | Admitting: Family Medicine

## 2020-03-30 ENCOUNTER — Ambulatory Visit (INDEPENDENT_AMBULATORY_CARE_PROVIDER_SITE_OTHER): Payer: Medicare HMO | Admitting: Family Medicine

## 2020-03-30 VITALS — BP 140/60 | HR 75 | Temp 97.5°F | Ht 65.0 in | Wt 238.9 lb

## 2020-03-30 DIAGNOSIS — E1140 Type 2 diabetes mellitus with diabetic neuropathy, unspecified: Secondary | ICD-10-CM

## 2020-03-30 DIAGNOSIS — Z0000 Encounter for general adult medical examination without abnormal findings: Secondary | ICD-10-CM | POA: Diagnosis not present

## 2020-03-30 LAB — BASIC METABOLIC PANEL
BUN: 29 mg/dL — ABNORMAL HIGH (ref 6–23)
CO2: 29 mEq/L (ref 19–32)
Calcium: 10.3 mg/dL (ref 8.4–10.5)
Chloride: 107 mEq/L (ref 96–112)
Creatinine, Ser: 0.96 mg/dL (ref 0.40–1.20)
GFR: 68.88 mL/min (ref >60.00)
Glucose, Bld: 161 mg/dL — ABNORMAL HIGH (ref 70–99)
Potassium: 4.7 mEq/L (ref 3.5–5.1)
Sodium: 143 mEq/L (ref 135–145)

## 2020-03-30 LAB — LIPID PANEL
Cholesterol: 179 mg/dL (ref 0–200)
HDL: 59 mg/dL (ref >39.00)
LDL Cholesterol: 94 mg/dL (ref 0–99)
NonHDL: 119.57
Total CHOL/HDL Ratio: 3
Triglycerides: 126 mg/dL (ref 0.0–149.0)
VLDL: 25.2 mg/dL (ref 0.0–40.0)

## 2020-03-30 LAB — HEPATIC FUNCTION PANEL
ALT: 16 U/L (ref 0–35)
AST: 15 U/L (ref 0–37)
Albumin: 4.2 g/dL (ref 3.5–5.2)
Alkaline Phosphatase: 59 U/L (ref 39–117)
Bilirubin, Direct: 0.1 mg/dL (ref 0.0–0.3)
Total Bilirubin: 0.4 mg/dL (ref 0.2–1.2)
Total Protein: 7.3 g/dL (ref 6.0–8.3)

## 2020-03-30 LAB — CBC WITH DIFFERENTIAL/PLATELET
Basophils Absolute: 0 10*3/uL (ref 0.0–0.1)
Basophils Relative: 0.5 % (ref 0.0–3.0)
Eosinophils Absolute: 0.3 10*3/uL (ref 0.0–0.7)
Eosinophils Relative: 3.8 % (ref 0.0–5.0)
HCT: 38.9 % (ref 36.0–46.0)
Hemoglobin: 12.6 g/dL (ref 12.0–15.0)
Lymphocytes Relative: 31.5 % (ref 12.0–46.0)
Lymphs Abs: 2.2 10*3/uL (ref 0.7–4.0)
MCHC: 32.4 g/dL (ref 30.0–36.0)
MCV: 92.9 fl (ref 78.0–100.0)
Monocytes Absolute: 0.9 10*3/uL (ref 0.1–1.0)
Monocytes Relative: 12.4 % — ABNORMAL HIGH (ref 3.0–12.0)
Neutro Abs: 3.5 10*3/uL (ref 1.4–7.7)
Neutrophils Relative %: 51.8 % (ref 43.0–77.0)
Platelets: 201 10*3/uL (ref 150.0–400.0)
RBC: 4.18 Mil/uL (ref 3.87–5.11)
RDW: 16 % — ABNORMAL HIGH (ref 11.5–15.5)
WBC: 6.8 10*3/uL (ref 4.0–10.5)

## 2020-03-30 LAB — HEMOGLOBIN A1C: Hgb A1c MFr Bld: 8.2 % — ABNORMAL HIGH (ref 4.6–6.5)

## 2020-03-30 LAB — TSH: TSH: 1.16 u[IU]/mL (ref 0.35–4.50)

## 2020-03-30 MED ORDER — SPIRONOLACTONE 25 MG PO TABS: 25.0000 mg | ORAL_TABLET | Freq: Every day | ORAL | 3 refills | Status: DC

## 2020-03-30 MED ORDER — ROSUVASTATIN CALCIUM 40 MG PO TABS: ORAL_TABLET | ORAL | 3 refills | Status: DC

## 2020-03-30 MED ORDER — DICLOFENAC SODIUM 75 MG PO TBEC: 75.0000 mg | DELAYED_RELEASE_TABLET | Freq: Two times a day (BID) | ORAL | 3 refills | Status: DC

## 2020-03-30 MED ORDER — GLIPIZIDE 5 MG PO TABS: 5.0000 mg | ORAL_TABLET | Freq: Every day | ORAL | 3 refills | Status: DC

## 2020-03-30 MED ORDER — METFORMIN HCL 500 MG PO TABS: 500.0000 mg | ORAL_TABLET | Freq: Two times a day (BID) | ORAL | 3 refills | Status: DC

## 2020-03-30 MED ORDER — LISINOPRIL 10 MG PO TABS: 20.0000 mg | ORAL_TABLET | Freq: Every day | ORAL | 3 refills | Status: DC

## 2020-03-30 MED ORDER — METOPROLOL SUCCINATE ER 50 MG PO TB24: 50.0000 mg | ORAL_TABLET | Freq: Every day | ORAL | 3 refills | Status: DC

## 2020-03-30 MED ORDER — FUROSEMIDE 20 MG PO TABS: 20.0000 mg | ORAL_TABLET | Freq: Every day | ORAL | 3 refills | Status: DC

## 2020-03-30 MED ORDER — AMLODIPINE BESYLATE 5 MG PO TABS: 5.0000 mg | ORAL_TABLET | Freq: Every day | ORAL | 3 refills | Status: DC

## 2020-03-30 NOTE — Progress Notes (Signed)
   Subjective:    Patient ID: [name], female    DOB: 1946-08-27, 74 y.o.   MRN: [id number]  HPI Here for a well exam. She is doing well. She still drives but only in the daytime. She gets her mammogram yearly. She has never had a colonoscopy. She has received both doses of the Covid vaccine.    Review of Systems  Constitutional: Negative.   HENT: Negative.   Eyes: Negative.   Respiratory: Negative.   Cardiovascular: Negative.   Gastrointestinal: Negative.   Genitourinary: Negative for decreased urine volume, difficulty urinating, dyspareunia, dysuria, enuresis, flank pain, frequency, hematuria, pelvic pain and urgency.  Musculoskeletal: Positive for gait problem.  Skin: Negative.   Neurological: Positive for weakness.  Psychiatric/Behavioral: Negative.        Objective:   Physical Exam Constitutional:      General: She is not in acute distress.    Appearance: She is well-developed. She is obese.     Comments: Walks with a cane   HENT:     Head: Normocephalic and atraumatic.     Right Ear: External ear normal.     Left Ear: External ear normal.     Nose: Nose normal.     Mouth/Throat:     Pharynx: No oropharyngeal exudate.  Eyes:     General: No scleral icterus.    Conjunctiva/sclera: Conjunctivae normal.     Pupils: Pupils are equal, round, and reactive to light.  Neck:     Thyroid: No thyromegaly.     Vascular: No JVD.  Cardiovascular:     Rate and Rhythm: Normal rate and regular rhythm.     Heart sounds: Normal heart sounds. No murmur. No friction rub. No gallop.   Pulmonary:     Effort: Pulmonary effort is normal. No respiratory distress.     Breath sounds: Normal breath sounds. No wheezing or rales.  Chest:     Chest wall: No tenderness.  Abdominal:     General: Bowel sounds are normal. There is no distension.     Palpations: Abdomen is soft. There is no mass.     Tenderness: There is no abdominal tenderness. There is no guarding or rebound.   Musculoskeletal:        General: No tenderness. Normal range of motion.     Cervical back: Normal range of motion and neck supple.  Lymphadenopathy:     Cervical: No cervical adenopathy.  Skin:    General: Skin is warm and dry.     Findings: No erythema or rash.  Neurological:     Mental Status: She is alert and oriented to person, place, and time.     Cranial Nerves: No cranial nerve deficit.     Motor: No abnormal muscle tone.     Coordination: Coordination normal.     Deep Tendon Reflexes: Reflexes are normal and symmetric. Reflexes normal.  Psychiatric:        Behavior: Behavior normal.        Thought Content: Thought content normal.        Judgment: Judgment normal.           Assessment & Plan:  Well exam. We discussed diet and exercise. Get fasting labs including an A1c. Set up a colonoscopy.  Alysia Penna, MD

## 2020-04-04 ENCOUNTER — Encounter: Payer: Self-pay | Admitting: Gastroenterology

## 2020-04-11 DIAGNOSIS — M169 Osteoarthritis of hip, unspecified: Secondary | ICD-10-CM | POA: Diagnosis not present

## 2020-04-14 ENCOUNTER — Ambulatory Visit: Payer: Medicare HMO | Admitting: Adult Health

## 2020-04-14 ENCOUNTER — Encounter: Payer: Self-pay | Admitting: Adult Health

## 2020-04-14 ENCOUNTER — Other Ambulatory Visit: Payer: Self-pay

## 2020-04-14 VITALS — BP 160/78 | HR 68 | Ht 65.0 in | Wt 239.0 lb

## 2020-04-14 DIAGNOSIS — E782 Mixed hyperlipidemia: Secondary | ICD-10-CM

## 2020-04-14 DIAGNOSIS — E1140 Type 2 diabetes mellitus with diabetic neuropathy, unspecified: Secondary | ICD-10-CM | POA: Diagnosis not present

## 2020-04-14 DIAGNOSIS — Z8673 Personal history of transient ischemic attack (TIA), and cerebral infarction without residual deficits: Secondary | ICD-10-CM | POA: Diagnosis not present

## 2020-04-14 DIAGNOSIS — I10 Essential (primary) hypertension: Secondary | ICD-10-CM

## 2020-04-14 NOTE — Progress Notes (Signed)
[facility] [address] [place] [address] [phone]       OFFICE FOLLOW UP VISIT NOTE  Ms. DEBRA COLON Date of Birth:  29-Apr-1946 Medical Record Number:  [id number]   Referring MD: Alysia Penna  Reason for Referral: Stroke  Chief complaint: Chief Complaint  Patient presents with  . Follow-up    tx room here for a f/u from a stroke. Ptis having no new sx or concerns.      HPI:  Today, 04/14/2020, Ms. Noorani returns for stroke follow-up accompanied by a friend.  Residual deficits of right hemiparesis with ongoing improvement. Ambulates with cane.  Denies new stroke/TIA symptoms.  Continues on aspirin and Crestor for secondary stroke prevention.  Blood pressure today elevated at 160/78, asymptomatic.  Does not routinely monitor at home.  Reports glucose levels stable but recent A1c 8.2.  Reports occasional difficulty with neuropathy and chronic lower intermittent extremity cramping.  No stroke or neurological concerns at this time.   History provided for reference purposes only Update 10/15/2019 Dr. Leonie Man : She returns for follow-up after last visit 2-1/2 months ago.  She states she is doing well.  She has had no recurrent stroke or TIA symptoms.  She has noticed improvement in her right-sided strength.  She can walk short distances by herself but uses a 4 pronged cane for long distances.  She has had no falls or injuries.  She is stopped Plavix and remains on aspirin which is tolerating well without bruising or bleeding.  Blood pressure is well controlled.  She states her fasting sugars range from 110-140.  She is tolerating Crestor without muscle aches and pains.  She had no new complaints.  Initial visit 07/21/2019 Dr. Leonie Man : Ms. Lighty is a 74 year old African-American lady seen today for initial office consultation visit for stroke.  She is accompanied by her daughter.  History is obtained from them, review of electronic medical records and have  personally reviewed imaging films in PACS.  Ms. Ariel is a 74 year old pleasant African-American lady with past medical history of diabetes, prior hypertension, hyperlipidemia presented with sudden onset of expressive language difficulties and dysarthria on 06/27/2019.  CT scan of the head on admission showed multiple lacunar infarcts of chronic and possibly subacute ages however MRI scan revealed an acute nonhemorrhagic left anterior cerebral artery infarct involving left parasagittal frontoparietal region.  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  MRI of the brain showed no significant large vessel stenosis or occlusion.  MRA of the neck was read as showing 50% right ICA stenosis but per my review there is clearly no significant stenosis noted.  This was confirmed on carotid ultrasound which shows bilateral only 1-39% stenosis.  Transthoracic echo showed normal ejection fraction with severe left ventricular hypertrophy and aortic valve calcification.  Hemoglobin A1c was elevated at 7.6.  LDL cholesterol was 89 mg percent.  Patient was started on dual antiplatelet therapy as well as seen by physical occupational and speech therapy.  She was discharged home and has made gradual improvement.  She is finished speech and physical therapy.  She still has some word hesitancy and trouble speaking but is gradually getting better.  She is now able to ambulate with a wheeled walker.  She is pretty safe and has no falls or injuries.  She feels chronic knee pain from arthritis despite knee replacement is the more reason for her walker.  She is tolerating aspirin  Plavix well without bruising or bleeding.  She states her blood pressures under good control.  Patient is working with primary physician to get her sugar and cholesterol under better control.  She has no new complaints today.  She is living at home with daughter providing support.  She had no prior known history of  stroke and her right occipital infarct is apparently clinically silent as she does not have peripheral visual field loss.  She denies any history of atrial fibrillation, palpitations, cardiac arrhythmias or significant cardiac disease.  ROS:   14 system review of systems is positive for gait impairment, numbness/tingling, altered sensation, cramping, joint pain and all other systems negative PMH:  Past Medical History:  Diagnosis Date  . Acute bronchitis 07/22/2008  . Diabetes mellitus without complication ([place]) 01/21/3334  . 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 Hypertension     Social History:  Social History   Socioeconomic History  . Marital status: Widowed    Spouse name: Not on file  . Number of children: 0  . Years of education: 41  . Highest education level: Not on file  Occupational History  . 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 Smoking status: Former Smoker    Packs/day: 0.50    Years: 48.00    Pack years: 24.00    Types: Cigarettes    Start date: 11/26/1968  . Smokeless tobacco: Never Used  . Tobacco comment: trying to quit;  not really motivated at present  Substance and Sexual Activity  . Alcohol use: Not Currently    Alcohol/week: 0.0 standard drinks    Comment: rare  . Drug use: No  . Sexual activity: Never  Other Topics Concern  . Not on file  Social History Narrative   01/23/2019:    Lives with "great-grandson" in one level with new dog, Brandon.    Hx raising of her deceased sister's 3 children, and is now raising/supporting 10yo  'great-grandson'.    Attends church, church community as support system. Many of her family has passed away.   Enjoys watching TV; used to enjoy going fishing         Social Determinants of Radio broadcast assistant Strain: Low Risk   . Difficulty of Paying Living Expenses: Not very hard  Food Insecurity: No Food Insecurity  . Worried About Charity fundraiser in the Last Year: Never true  . Ran Out of Food in the Last  Year: Never true  Transportation Needs:   . Lack of Transportation (Medical):   Marland Kitchen Lack of Transportation (Non-Medical):   Physical Activity:   . Days of Exercise per Week:   . Minutes of Exercise per Session:   Stress:   . Feeling of Stress :   Social Connections:   . Frequency of Communication with Friends and Family:   . Frequency of Social Gatherings with Friends and Family:   . Attends Religious Services:   . Active Member of Clubs or Organizations:   . Attends Archivist Meetings:   Marland Kitchen Marital Status:   Intimate Partner Violence:   . Fear of Current or Ex-Partner:   . Emotionally Abused:   Marland Kitchen Physically Abused:   . Sexually Abused:     Medications:   Current Outpatient Medications on File Prior to Visit  Medication Sig Dispense Refill  . acetaminophen (TYLENOL) 325 MG tablet Take 650 mg by mouth every 6 (six) hours as needed for headache (pain).    . Alcohol Swabs (ALCOHOL PREP)  PADS Use to test 1 X day and diagnosis code is E 11.9 100 each 1  . amLODipine (NORVASC) 5 MG tablet Take 1 tablet (5 mg total) by mouth daily. 90 tablet 3  . aspirin 81 MG chewable tablet Chew 1 tablet (81 mg total) by mouth daily. 30 tablet 3  . diclofenac (VOLTAREN) 75 MG EC tablet Take 1 tablet (75 mg total) by mouth 2 (two) times daily. 180 tablet 3  . furosemide (LASIX) 20 MG tablet Take 1 tablet (20 mg total) by mouth daily. 90 tablet 3  . glipiZIDE (GLUCOTROL) 5 MG tablet Take 1 tablet (5 mg total) by mouth daily before breakfast. 90 tablet 3  . glucose blood (ONETOUCH VERIO) test strip USE FOR TESTING ONCE DAILY 100 each 0  . Lancets (ONETOUCH ULTRASOFT) lancets USE   TO CHECK GLUCOSE ONCE DAILY 100 each 0  . lisinopril (ZESTRIL) 10 MG tablet Take 2 tablets (20 mg total) by mouth daily. 180 tablet 3  . metFORMIN (GLUCOPHAGE) 500 MG tablet Take 1 tablet (500 mg total) by mouth 2 (two) times daily with a meal. 180 tablet 3  . metoprolol succinate (TOPROL-XL) 50 MG 24 hr tablet Take 1  tablet (50 mg total) by mouth daily. Take with or immediately following a meal. 90 tablet 3  . rosuvastatin (CRESTOR) 40 MG tablet Take one tablet by mouth daily 90 tablet 3  . spironolactone (ALDACTONE) 25 MG tablet Take 1 tablet (25 mg total) by mouth daily. 90 tablet 3   No current facility-administered medications on file prior to visit.    Allergies:   Allergies  Allergen Reactions  . Hydromet [Hydrocodone-Homatropine] Itching    Vitals Today's Vitals   04/14/20 1243  BP: (!) 169/73  Pulse: 68  Weight: 239 lb (108.4 kg)  Height: 5\' 5"  (1.651 m)   Body mass index is 39.77 kg/m.    Physical Exam General: Obese pleasant elderly African-American lady, seated, in no evident distress Head: head normocephalic and atraumatic.   Neck: supple with no carotid or supraclavicular bruits Cardiovascular: regular rate and rhythm, no murmurs Musculoskeletal: no deformity Skin:  no rash/petichiae Vascular:  Normal pulses all extremities  Neurologic Exam Mental Status: Awake and fully alert.  Fluent speech and language.  Oriented to place and time. Recent and remote memory intact. Attention span, concentration and fund of knowledge appropriate. Mood and affect appropriate.  Cranial Nerves: Pupils equal, briskly reactive to light. Extraocular movements full without nystagmus. Visual fields full to confrontation. Hearing intact. Facial sensation intact.  Mild right lower nasolabial fold asymmetry when she smiles., tongue, palate moves normally and symmetrically.  Motor: Normal bulk and tone. Normal strength in all tested extremity muscles except diminished fine finger movements on the right.  Sensory.: intact to touch , pinprick , position and vibratory sensation.  Coordination: Rapid alternating movements normal in all extremities except slightly decreased right hand. Finger-to-nose and heel-to-shin performed accurately bilaterally. Gait and Station: Arises from chair without difficulty.  Stance is normal. Gait demonstrates normal stride length and balance with use of cane Reflexes: 1+ and symmetric. Toes downgoing.     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    PLAN: Continue aspirin 81 mg daily and Crestor 40 mg daily for secondary stroke prevention Continue to follow PCP for  HTN, HLD and DM with neuropathy management Advised to monitor blood pressure at home and if remains elevated to follow-up with PCP maintain strict control of hypertension with blood pressure goal below 130/90, diabetes with hemoglobin A1c goal below 6.5% and lipids with LDL cholesterol goal below 70 mg/dL. I also advised the patient to eat a healthy diet with plenty of whole grains, cereals, fruits and vegetables, exercise regularly and maintain ideal body weight   Overall stable from stroke standpoint and recommend follow-up as needed   I spent 26 minutes of face-to-face and non-face-to-face time with patient.  This included previsit chart review, lab review, study review, order entry, electronic health record documentation, patient education   Frann Rider, [facility]  [facility] [address] [place], [place] [address]  Phone [phone] Fax [phone] Note: This document was prepared with digital dictation and possible smart phrase technology. Any transcriptional errors that result from this process are unintentional.

## 2020-04-14 NOTE — Patient Instructions (Signed)
Continue aspirin 81 mg daily  and Crestor for secondary stroke prevention  Continue to follow up with PCP regarding cholesterol, blood pressure and diabetes management   Continue to monitor blood pressure at home  Maintain strict control of hypertension with blood pressure goal below 130/90, diabetes with hemoglobin A1c goal below 6.5% and cholesterol with LDL cholesterol (bad cholesterol) goal below 70 mg/dL. I also advised the patient to eat a healthy diet with plenty of whole grains, cereals, fruits and vegetables, exercise regularly and maintain ideal body weight.  As you have been doing well from a stroke standpoint, recommend following up as needed but please do not hesitate to call with any questions or concerns       Thank you for coming to see [place] at [facility]. I hope we have been able to provide you high quality care today.  You may receive a patient satisfaction survey over the next few weeks. We would appreciate your feedback and comments so that we may continue to improve ourselves and the health of our patients.

## 2020-04-15 NOTE — Progress Notes (Signed)
I agree with the above plan 

## 2020-04-28 ENCOUNTER — Other Ambulatory Visit: Payer: Self-pay | Admitting: *Deleted

## 2020-04-28 NOTE — Patient Outreach (Addendum)
[facility]) Care Management  [place]  04/28/2020   MEYA CLUTTER 1946/09/15 [id number]  Subjective: Successful telephone outreach call to patient. HIPAA identifiers obtained. Patient states she feels good and denies having any falls or pain. Patient reports that she did receive the diabetes education and Advance Directives nurse sent her previously. Per patient she has not been doing very well with being healthy to control her diabetes. Explaining that she is eating too much and she has not started to do any chair exercises or increased her physical activity. Patient did say that she is limiting the amount of bread she eats and has stopped drinking sugary drinks. Her FBS was 141 today and her ranges have been 130-140's. When she spot checks her CBG in the afternoon ranges are 140->200 and as a result her A1c on 03/30/20 increase from 7.6 - 8.2. Patient also stated that she is not taking her B/P at home because the arm cuff is painful. Through conversation, patient remembered that she has a wrist B/P cuff and states she will start to use the wrist cuff daily. Nurse praised patient for limiting the bread and sugary drinks and encouraged her that small steps add up to be big steps which will improve her diabetes, B/P, overall health, and wellness. Patient did not have any further concerns at this time.   Encounter Medications:  Outpatient Encounter Medications as of 04/28/2020  Medication Sig   acetaminophen (TYLENOL) 325 MG tablet Take 650 mg by mouth every 6 (six) hours as needed for headache (pain).   Alcohol Swabs (ALCOHOL PREP) PADS Use to test 1 X day and diagnosis code is E 11.9   amLODipine (NORVASC) 5 MG tablet Take 1 tablet (5 mg total) by mouth daily.   aspirin 81 MG chewable tablet Chew 1 tablet (81 mg total) by mouth daily.   diclofenac (VOLTAREN) 75 MG EC tablet Take 1 tablet (75 mg total) by mouth 2 (two) times daily.   furosemide (LASIX) 20 MG tablet  Take 1 tablet (20 mg total) by mouth daily.   glipiZIDE (GLUCOTROL) 5 MG tablet Take 1 tablet (5 mg total) by mouth daily before breakfast.   glucose blood (ONETOUCH VERIO) test strip USE FOR TESTING ONCE DAILY   Lancets (ONETOUCH ULTRASOFT) lancets USE   TO CHECK GLUCOSE ONCE DAILY   lisinopril (ZESTRIL) 10 MG tablet Take 2 tablets (20 mg total) by mouth daily.   metFORMIN (GLUCOPHAGE) 500 MG tablet Take 1 tablet (500 mg total) by mouth 2 (two) times daily with a meal.   metoprolol succinate (TOPROL-XL) 50 MG 24 hr tablet Take 1 tablet (50 mg total) by mouth daily. Take with or immediately following a meal.   rosuvastatin (CRESTOR) 40 MG tablet Take one tablet by mouth daily   spironolactone (ALDACTONE) 25 MG tablet Take 1 tablet (25 mg total) by mouth daily.   No facility-administered encounter medications on file as of 04/28/2020.    Functional Status:  In your present state of health, do you have any difficulty performing the following activities: 03/24/2020 06/27/2019  Hearing? N N  Vision? N N  Difficulty concentrating or making decisions? N N  Walking or climbing stairs? N N  Dressing or bathing? N N  Doing errands, shopping? N N  Preparing Food and eating ? N -  Using the Toilet? N -  In the past six months, have you accidently leaked urine? Y -  Comment occassionally if I wait too long -  Do you have problems with loss of bowel control? Y -  Comment Occassionally, it may come out a little -  Managing your Medications? N -  Managing your Finances? N -  Housekeeping or managing your Housekeeping? N -  Some recent data might be hidden    Fall/Depression Screening: Fall Risk  04/28/2020 03/24/2020 07/21/2019  Falls in the past year? 0 0 0  Comment - - -  Number falls in past yr: 0 0 -  Injury with Fall? 0 0 -  Risk for fall due to : Impaired balance/gait Impaired balance/gait -  Follow up Falls prevention discussed;Education provided;Falls evaluation completed Falls  prevention discussed;Education provided;Falls evaluation completed -   PHQ 2/9 Scores 03/24/2020 01/23/2019 01/15/2018 02/14/2017 11/28/2016 07/23/2016 10/11/2014  PHQ - 2 Score 0 0 0 0 0 0 0  PHQ- 9 Score - 1 - - - - -   [name] CM Care Plan Problem One     Most Recent Value  Care Plan Problem One  Knowledge deficient related to diabetes condition, treatment plan, and lifestyle changes  Role Documenting the Problem One  [place] for Problem One  Active  [name] Long Term Goal   Patient will report reduction in A1c by 0.5-1 points within the next 90 days.  [name] Long Term Goal Start Date  03/24/20  Interventions for Problem One Long Term Goal  RN encouraged patient to continue with daily FBS monitoring and record data, encouraged medication adherence, discussed reflecting back to diet when FBS values are high, encouraged patient to follow diabetic and low sodium diet and to use diabetic plate method as a guide,  she states she has a picture of diabetic plate.  [name] CM Short Term Goal #1   Patient will start to exercise x2 weekly within the next 30 days  [name] CM Short Term Goal #1 Start Date  03/24/20  Interventions for Short Term Goal #1  RN discussed how exercise relates to diabetes, maintaining strength, and weight loss, discussed weight management, encouraged patient to use portion control and maintain a healthy diet, will send exercise program activity booklet, will send planning healthy meals booklet.  [name] CM Short Term Goal #2   Patient will take her B/P daily and record within 30 days  Interventions for Short Term Goal #2  Nurse educated about the importance of monitoring B/P daily and recording values as a preventative measure against her having another stroke, encouraged patient to take her B/P daily and record, discussed using her wrist monitor as patient states her arm monitor is too painful, will send hypertension education.      Plan: Nurse will send patient a exercise activity  booklet, a planning healthy meals booklet, and hypertension education. RN Health Coach will call patient within the month of July and patient agrees to future outreach calls.   Emelia Loron RN, BSN [place] [phone] [email or web address]

## 2020-05-04 ENCOUNTER — Telehealth: Payer: Self-pay | Admitting: *Deleted

## 2020-05-04 NOTE — Telephone Encounter (Signed)
Pt is here for her PV today- she is scheduled for her first screening colonoscopy for 05-18-20.  She arrives in a wheelchair.  She had a CVA November 2020 and hasn't been able to "walk well since then."  She needed a lot of assistance to even be able to stand on the scale for her weight.  She is here with her son.  They both state that she has had a change in bowel habits, possibly related to medications.  I made her an OV with Jessica 05-26-20 at 2:00pm

## 2020-05-05 DIAGNOSIS — Z961 Presence of intraocular lens: Secondary | ICD-10-CM | POA: Diagnosis not present

## 2020-05-05 DIAGNOSIS — H25812 Combined forms of age-related cataract, left eye: Secondary | ICD-10-CM | POA: Diagnosis not present

## 2020-05-05 DIAGNOSIS — H539 Unspecified visual disturbance: Secondary | ICD-10-CM | POA: Diagnosis not present

## 2020-05-13 IMAGING — MR MRA NECK WITHOUT AND WITH CONTRAST (IWTH CONTRAST)
5 of 8 series · 28 of 48 positions shown · IV contrast (Contrast agent)
Comparison: MRI studies earlier same day.

CLINICAL DATA: Follow-up acute stroke with slurred speech. Left
anterior cerebral artery territory infarctions.

EXAM:
MRA NECK WITHOUT AND WITH CONTRAST
TECHNIQUE: Multiplanar and multiecho pulse sequences of the neck were obtained
without and with intravenous contrast. Angiographic images of the
neck were obtained using MRA technique without and with intravenous
contrast.
CONTRAST:  9 cc Gadavist

[Series 7: tof_fl3d_tra_iso · axial · B · 0.6mm · 0.55mm/px · z∈[+8,+88]mm · 7 of 133 slices shown]
[im 1/133]
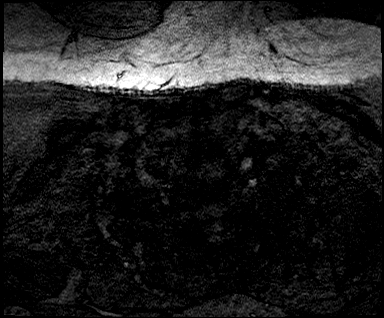
[im 23/133]
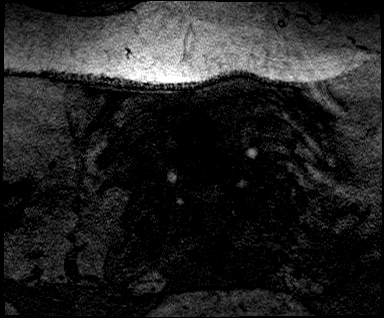
[im 45/133]
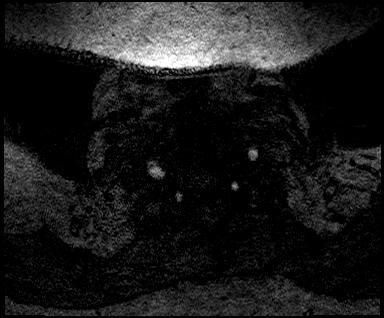
[im 67/133]
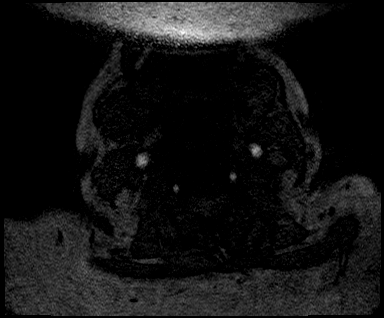
[im 89/133]
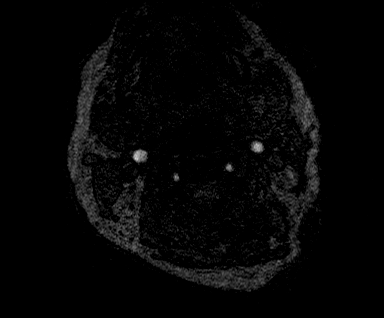
[im 111/133]
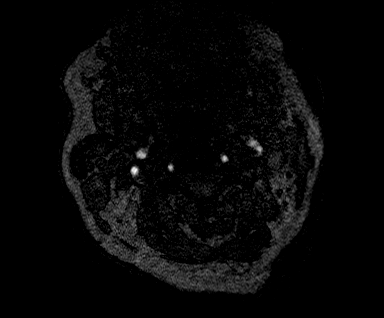
[im 133/133]
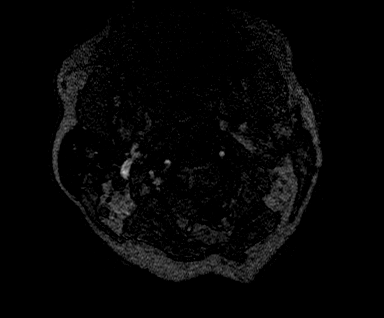

[Series 10: angio_fl3d_cor_highres_pre_ttc=3.0s · coronal · B · 0.9mm · 0.62mm/px · 5 of 96 slices shown]
[im 1/96]
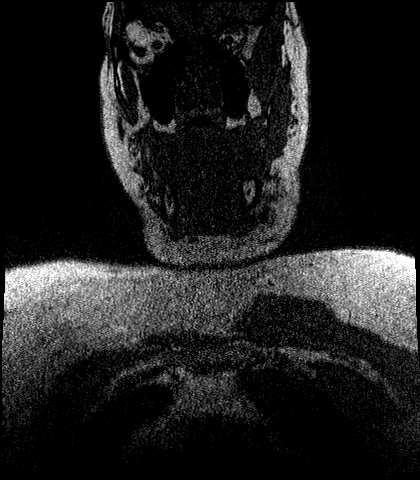
[im 24/96]
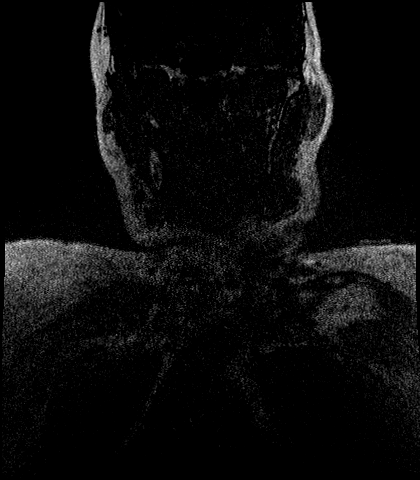
[im 48/96]
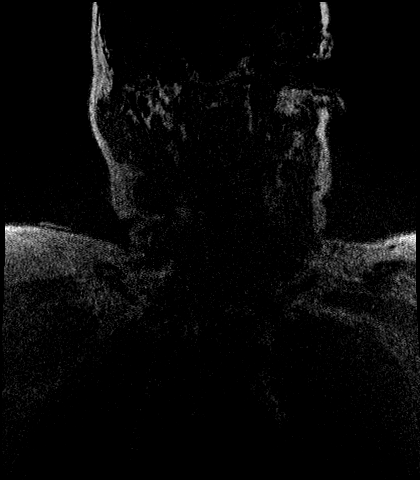
[im 72/96]
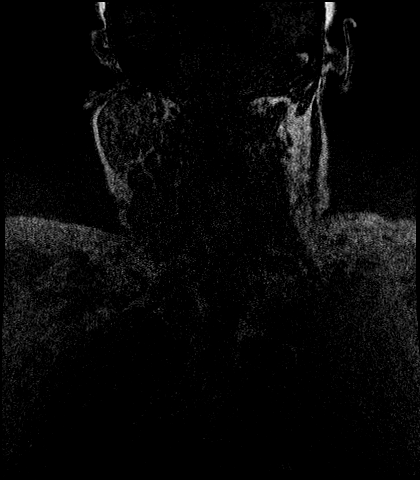
[im 96/96]
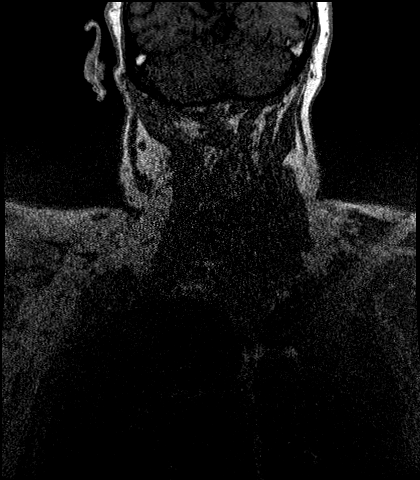

[Series 12: angio_fl3d_cor_highres_post_ttc=3.0s · coronal · B · 0.9mm · 0.62mm/px · 6 of 96 slices shown]
[im 1/96]
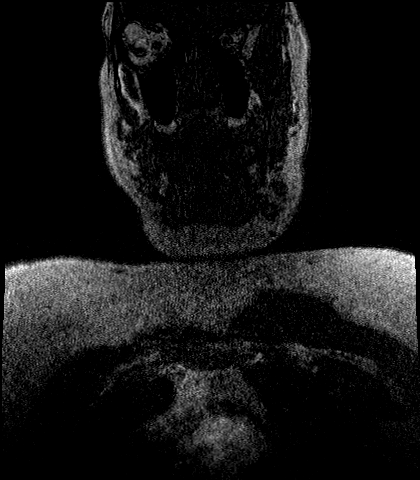
[im 20/96]
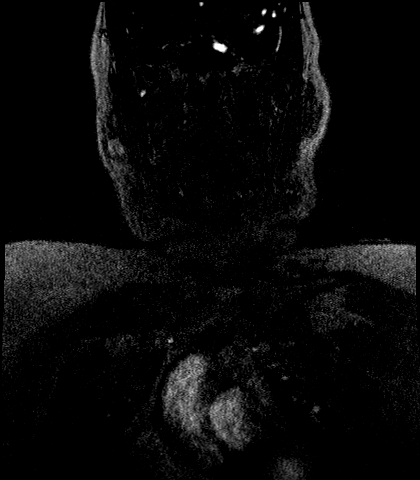
[im 39/96]
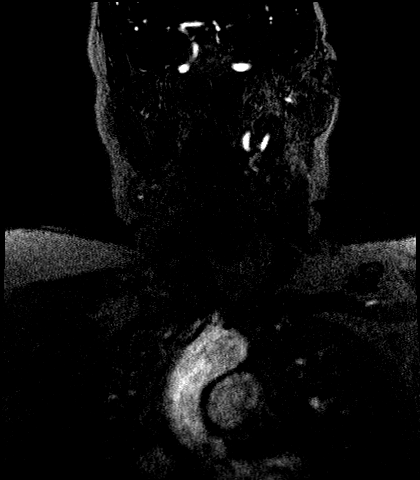
[im 58/96]
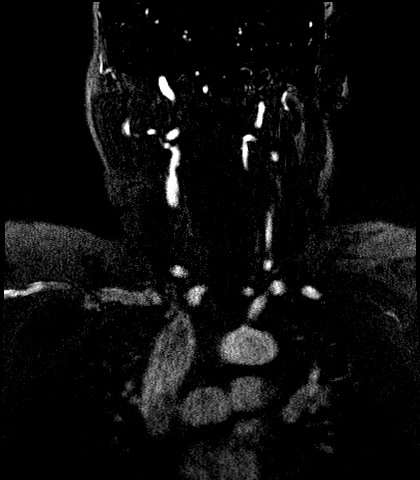
[im 77/96]
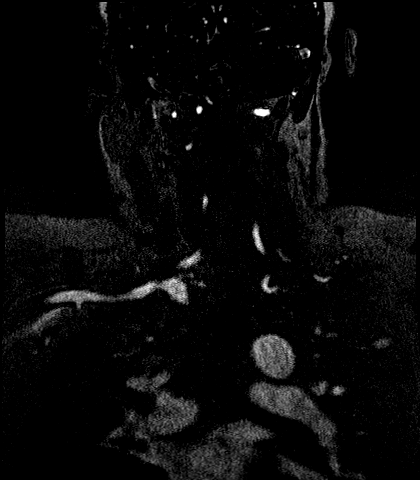
[im 96/96]
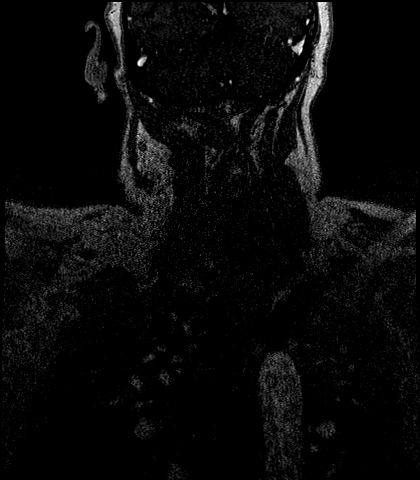

[Series 13: angio_fl3d_cor_highres_post_ttc=3.0s_moco-adv · coronal · B · 0.9mm · 0.62mm/px · 6 of 96 slices shown]
[im 1/96]
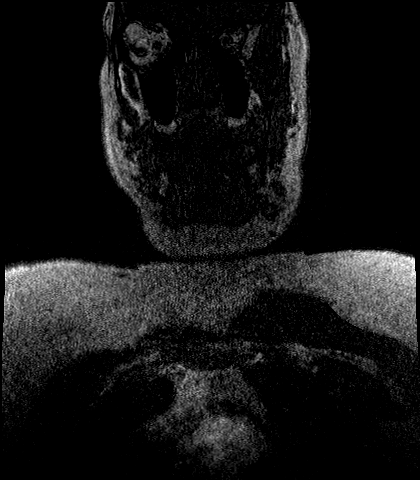
[im 20/96]
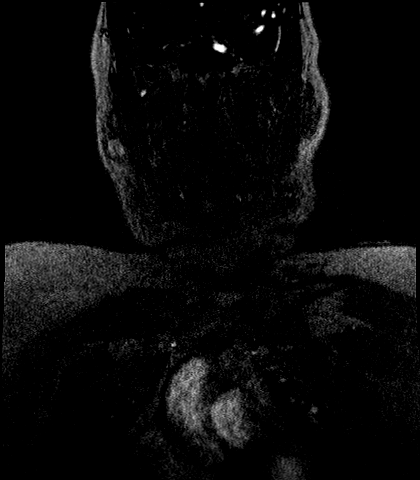
[im 39/96]
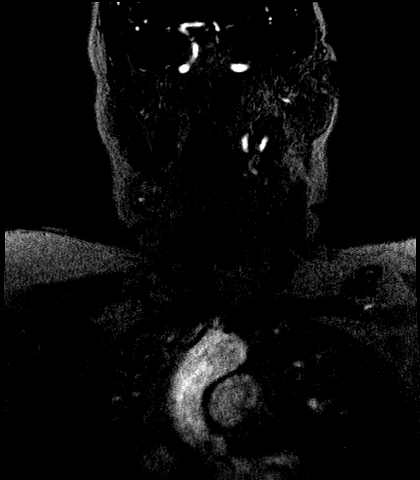
[im 58/96]
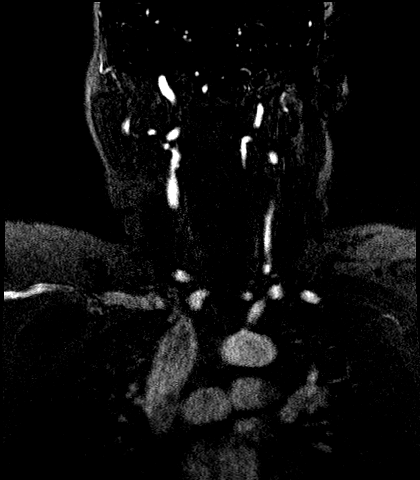
[im 77/96]
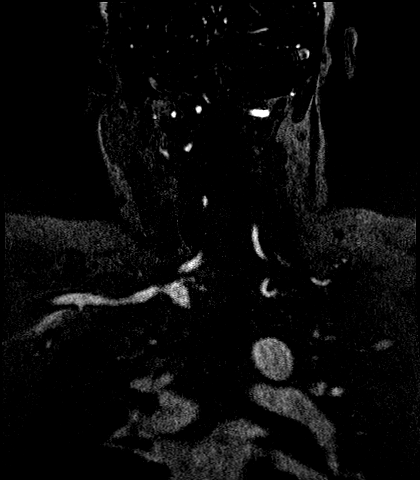
[im 96/96]
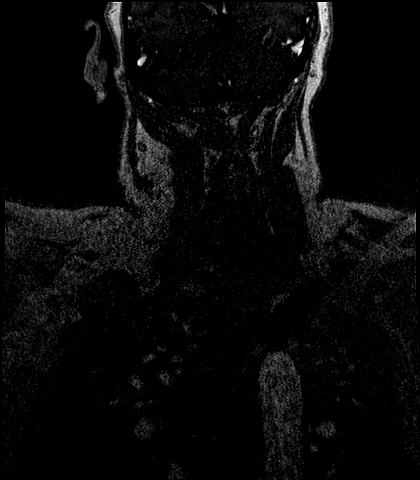

[Series 14: angio_fl3d_cor_highres_post_ttc=3.0s_moco-adv_sub · coronal · B · 0.9mm · 0.62mm/px · 4 of 96 slices shown]
[im 1/96]
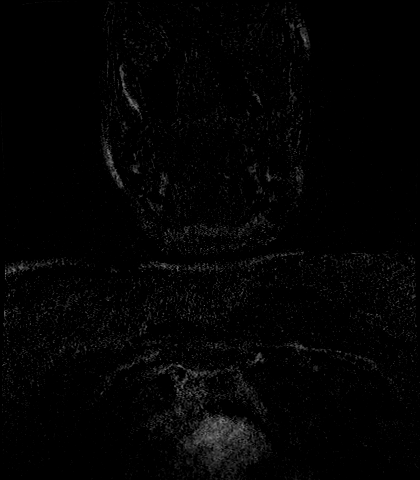
[im 20/96]
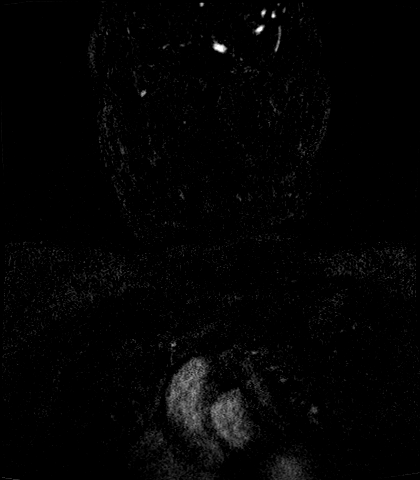
[im 39/96]
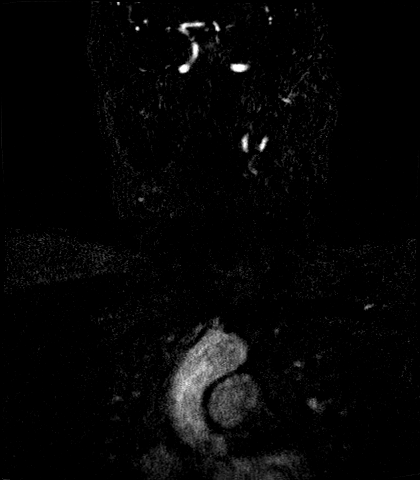
[im 58/96]
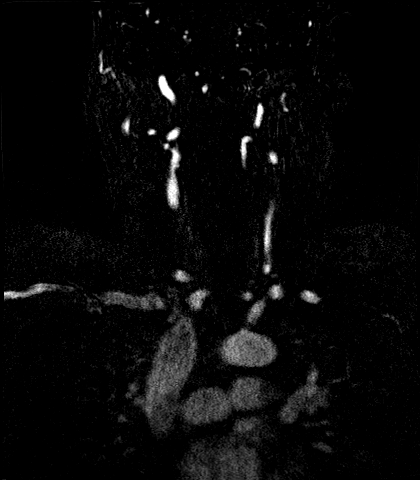

[28 of 48 positions shown; findings below may reference images not displayed]

FINDINGS: Aorta appears normal. Branching pattern of the brachiocephalic
vessels appears normal without origin stenosis. Both common carotid
arteries are widely patent to their respective bifurcation. The
right proximal internal carotid artery shows 50% stenosis but is
widely patent beyond that through the skull base to the
circle-of-Willis. Proximal left internal carotid artery shows 50%
stenosis and is patent beyond that to the circle-of-Willis. Both
vertebral arteries are patent with the left being dominant.
Vertebral artery origins are not accurately evaluated given chest
motion.
IMPRESSION: 50% stenoses at both proximal internal carotid arteries. Internal
carotid arteries widely patent beyond that. No posterior circulation
pathology evident.

## 2020-05-13 IMAGING — MR MRI HEAD WITHOUT CONTRAST
10 of 12 series · 32 of 48 positions shown · non-contrast
Comparison: Prior CT from 06/26/2019.

CLINICAL DATA: Initial evaluation for acute stroke, slurred speech.

EXAM:
MRI HEAD WITHOUT CONTRAST
MRA HEAD WITHOUT CONTRAST
TECHNIQUE: Multiplanar, multiecho pulse sequences of the brain and surrounding
structures were obtained without intravenous contrast. Angiographic
images of the head were obtained using MRA technique without
contrast.

[Series 5: DWI · axial · 3.0mm · 0.88mm/px · z∈[-69,+64]mm · 6 of 92 slices shown (1 of 4)]
[im 1/92]
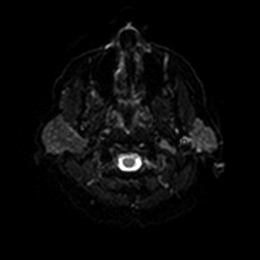
[im 19/92]
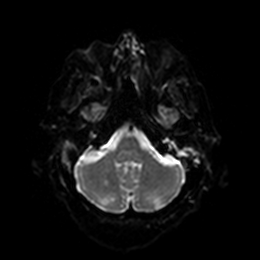
[im 37/92]
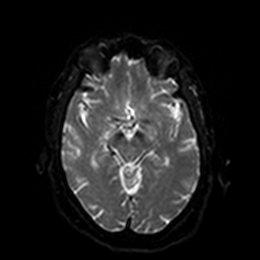
[im 55/92]
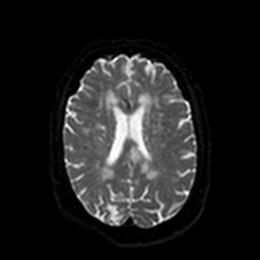
[im 73/92]
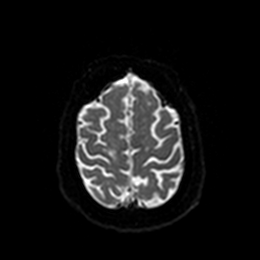
[im 92/92]
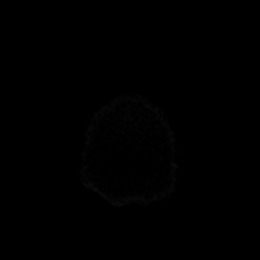

[Series 6: DWI · axial · 3.0mm · 0.88mm/px · z∈[-69,+64]mm · 3 of 46 slices shown (2 of 4)]
[im 1/46]
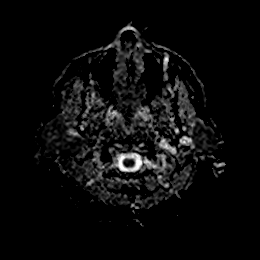
[im 23/46]
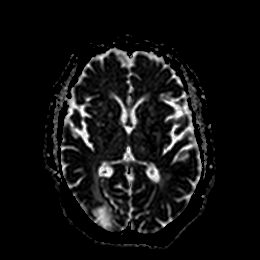
[im 46/46]
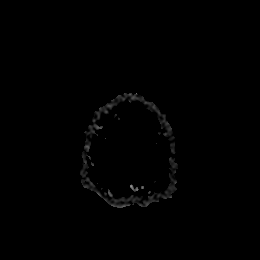

[Series 7: DWI · coronal · 4.0mm · 0.88mm/px · 5 of 68 slices shown (3 of 4)]
[im 1/68]
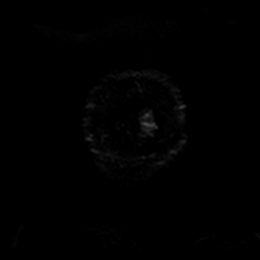
[im 17/68]
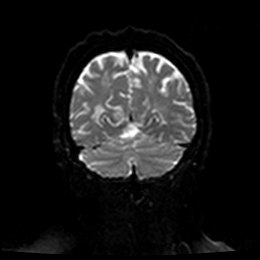
[im 34/68]
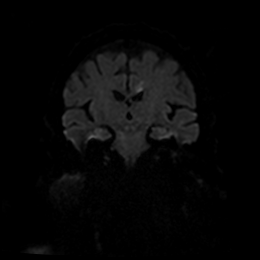
[im 51/68]
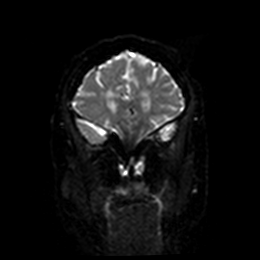
[im 68/68]
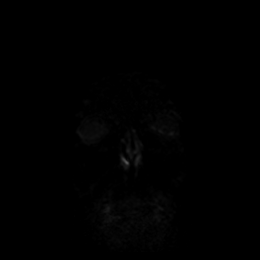

[Series 8: DWI · coronal · 4.0mm · 0.88mm/px · 2 of 34 slices shown (4 of 4)]
[im 1/34]
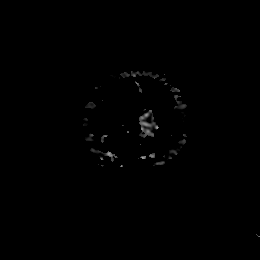
[im 34/34]
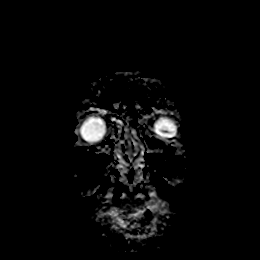

[Series 9: T1 · sagittal · 5.0mm · 0.75mm/px · 2 of 25 slices shown]
[im 1/25]
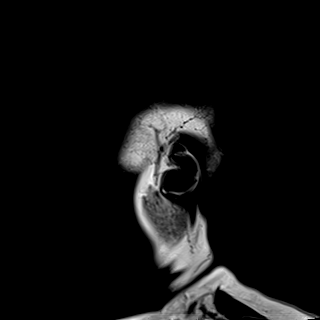
[im 25/25]
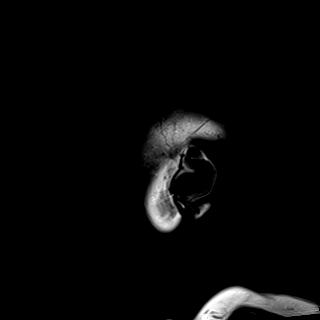

[Series 14: T2 · axial · 5.0mm · 0.72mm/px · z∈[-80,+62]mm · 2 of 25 slices shown (1 of 2)]
[im 1/25]
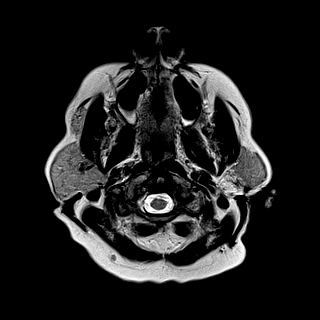
[im 25/25]
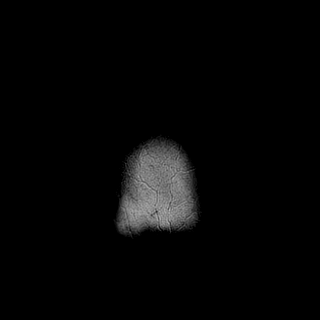

[Series 15: FLAIR · axial · 5.0mm · 0.45mm/px · z∈[-77,+65]mm · 2 of 25 slices shown]
[im 1/25]
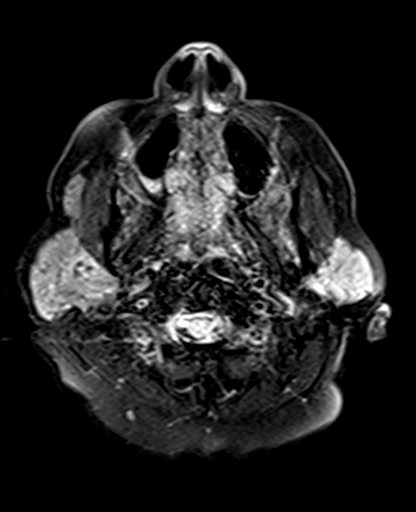
[im 25/25]
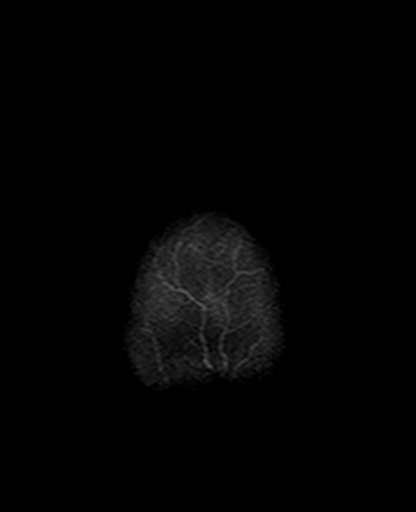

[Series 17: pha_images · axial · 3.0mm · 0.90mm/px · z∈[-92,+77]mm · 4 of 58 slices shown]
[im 1/58]
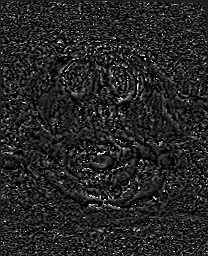
[im 20/58]
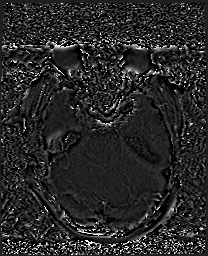
[im 39/58]
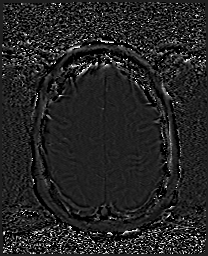
[im 58/58]
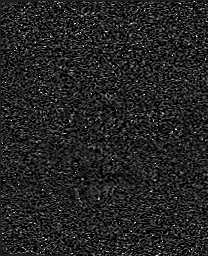

[Series 18: swi_images · axial · 3.0mm · 0.90mm/px · z∈[-97,+77]mm · 4 of 60 slices shown]
[im 1/60]
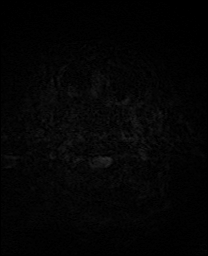
[im 20/60]
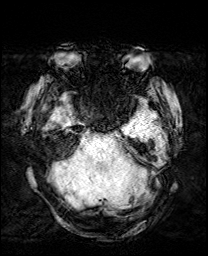
[im 40/60]
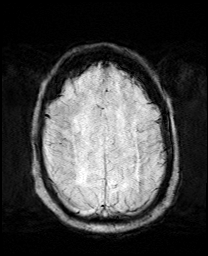
[im 60/60]
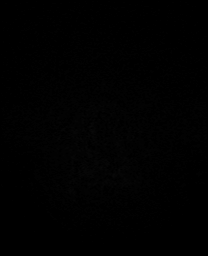

[Series 21: T2 · coronal · 5.0mm · 0.34mm/px · 2 of 29 slices shown (2 of 2)]
[im 1/29]
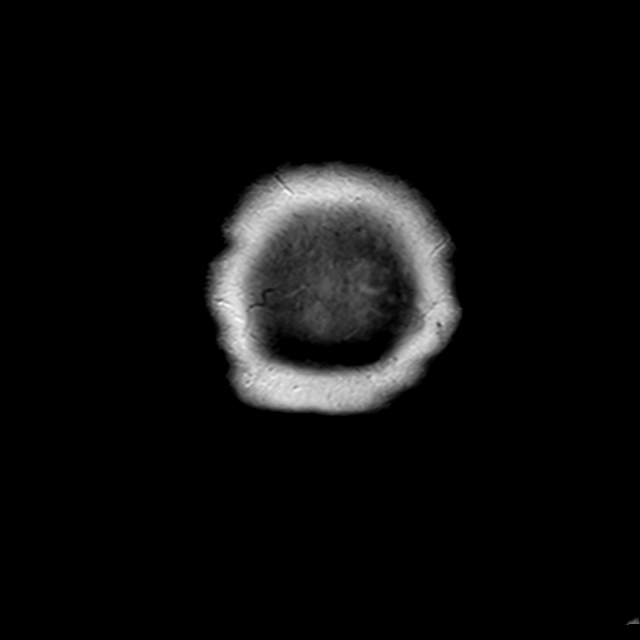
[im 29/29]
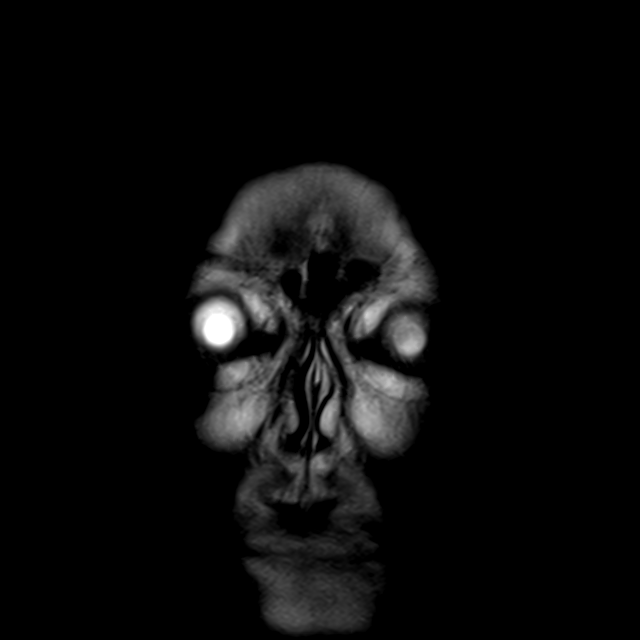

[32 of 48 positions shown; findings below may reference images not displayed]

FINDINGS: MRI HEAD FINDINGS

Brain: Diffuse prominence of the CSF containing spaces compatible
generalized age-related cerebral atrophy. Patchy and confluent
T2/FLAIR hyperintensity within the periventricular and deep white
matter both cerebral hemispheres most consistent with chronic small
vessel ischemic disease, moderate to advanced in nature. Chronic
microvascular ischemic changes present within the pons. Multiple
superimposed remote lacunar infarcts present within the bilateral
basal ganglia and thalami. Few small bilateral cerebellar infarcts
noted. Encephalomalacia and gliosis within the right occipital lobe
compatible with chronic right PCA territory infarct. Associated mild
chronic hemosiderin staining noted within this region.

Patchy multifocal areas of restricted diffusion seen involving the
parasagittal left frontoparietal region, compatible with acute left
ACA territory infarct. Involvement of the splenium noted. No
associated hemorrhage or mass effect. No other evidence for acute or
subacute ischemia. Gray-white matter differentiation otherwise
maintained.

No mass lesion, midline shift or mass effect. No hydrocephalus. No
extra-axial fluid collection. Pituitary gland suprasellar region
normal.

Vascular: Major intracranial vascular flow voids are maintained.

Skull and upper cervical spine: Craniocervical junction normal.
Upper cervical spine within normal limits. Bone marrow signal
intensity normal. No scalp soft tissue abnormality.

Sinuses/Orbits: Patient status post ocular lens replacement on the
right. Globes and orbital soft tissues demonstrate no acute finding.
Paranasal sinuses are clear. Bilateral mastoid effusions noted, left
larger than right. Inner ear structures grossly normal.

Other: None.

MRA HEAD FINDINGS

ANTERIOR CIRCULATION:

Distal cervical segments of the internal carotid arteries are patent
with symmetric antegrade flow. Petrous segments widely patent
bilaterally. Atheromatous irregularity within the
cavernous/supraclinoid ICAs without hemodynamically significant
stenosis. ICA termini well perfused. A1 segments patent bilaterally.
Left A1 hypoplastic, accounting for the slightly diminutive left ICA
is compared to the right. Anterior cerebral arteries widely patent
to their distal aspects without stenosis.

M1 segments widely patent bilaterally. Negative MCA bifurcations.
Distal MCA branches well perfused and symmetric.

POSTERIOR CIRCULATION:

Vertebral arteries patent to the vertebrobasilar junction without
stenosis. Left vertebral artery dominant. Patent right PICA. Left
PICA not seen. Basilar patent to its distal aspect without stenosis.
Dominant left anterior inferior cerebral artery. Superior cerebral
arteries patent bilaterally. Both of the posterior cerebral arteries
are well perfused to their distal aspects.

Mild distal small vessel atheromatous irregularity noted throughout
the intracranial circulation.
IMPRESSION: MRI HEAD IMPRESSION:

1. Patchy acute ischemic nonhemorrhagic left ACA territory infarcts
involving the left parasagittal frontoparietal region. No associated
mass effect.
2. Chronic right PCA territory infarct involving the right occipital
lobe.
3. Underlying age-related cerebral atrophy with moderate chronic
microvascular ischemic disease, with multiple additional remote
lacunar infarcts involving the bilateral basal ganglia, thalami, and
cerebellum.

MRA HEAD IMPRESSION:

1. Negative intracranial MRA for large vessel occlusion. No
hemodynamically significant or proximal correctable stenosis.
2. Distal small vessel atheromatous irregularity throughout the
intracranial circulation.

## 2020-05-18 ENCOUNTER — Encounter: Payer: Medicare HMO | Admitting: Gastroenterology

## 2020-05-25 ENCOUNTER — Other Ambulatory Visit: Payer: Self-pay | Admitting: Family Medicine

## 2020-05-26 ENCOUNTER — Encounter: Payer: Self-pay | Admitting: Gastroenterology

## 2020-05-26 ENCOUNTER — Ambulatory Visit: Payer: Medicare Other | Admitting: Gastroenterology

## 2020-05-26 VITALS — BP 138/80 | HR 65 | Ht 65.0 in | Wt 235.6 lb

## 2020-05-26 DIAGNOSIS — Z1211 Encounter for screening for malignant neoplasm of colon: Secondary | ICD-10-CM

## 2020-05-26 MED ORDER — PLENVU 140 G PO SOLR: 1.0000 | ORAL | 0 refills | Status: DC

## 2020-05-26 NOTE — Patient Instructions (Signed)
If you are age 73 or older, your body mass index should be between 23-30. Your Body mass index is 39.21 kg/m. If this is out of the aforementioned range listed, please consider follow up with your Primary Care Provider.  If you are age 86 or younger, your body mass index should be between 19-25. Your Body mass index is 39.21 kg/m. If this is out of the aformentioned range listed, please consider follow up with your Primary Care Provider.   You have been scheduled for a colonoscopy. Please follow written instructions given to you at your visit today.  Please pick up your prep supplies at the pharmacy within the next 1-3 days. If you use inhalers (even only as needed), please bring them with you on the day of your procedure.  Follow up pending the results or your Colonoscopy

## 2020-05-26 NOTE — Progress Notes (Signed)
05/26/2020 [name] [id number] 01-Jul-1946   HISTORY OF PRESENT ILLNESS:  This is a 74 year old female who is new to our office.  She is here today to discuss colonoscopy.  Never had one in the past.  Denies any family history of colon cancer.  She denies any issues with rectal bleeding.  Hgb is normal.  Says that she moves her bowels daily, except for occasional diarrhea that she thinks is usually due to something that she ate or even sometimes from the metformin.   Past Medical History:  Diagnosis Date  . Acute bronchitis 07/22/2008  . Diabetes mellitus without complication ([place]) 7/34/1937  . History of CVA (cerebrovascular accident)   . 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 Hypertension    Past Surgical History:  Procedure Laterality Date  . EYE SURGERY Right 11/2018   implant  . 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 NECK SURGERY     Remove a tumor  . TYMPANOSTOMY     Dr. Thornell Mule    reports that she has been smoking cigarettes. She started smoking about 51 years ago. She has a 24.00 pack-year smoking history. She has never used smokeless tobacco. She reports previous alcohol use. She reports that she does not use drugs. family history includes Heart disease in her father. Allergies  Allergen Reactions  . Hydromet [Hydrocodone-Homatropine] Itching      Outpatient Encounter Medications as of 05/26/2020  Medication Sig  . acetaminophen (TYLENOL) 325 MG tablet Take 650 mg by mouth every 6 (six) hours as needed for headache (pain).  . Alcohol Swabs (ALCOHOL PREP) PADS Use to test 1 X day and diagnosis code is E 11.9  . amLODipine (NORVASC) 5 MG tablet Take 1 tablet (5 mg total) by mouth daily.  Marland Kitchen aspirin 81 MG chewable tablet Chew 1 tablet (81 mg total) by mouth daily.  . diclofenac (VOLTAREN) 75 MG EC tablet Take 1 tablet (75 mg total) by mouth 2 (two) times daily.  . furosemide (LASIX) 20 MG tablet Take 1 tablet by mouth once daily  . glipiZIDE  (GLUCOTROL) 5 MG tablet Take 1 tablet (5 mg total) by mouth daily before breakfast.  . glucose blood (ONETOUCH VERIO) test strip USE FOR TESTING ONCE DAILY  . Lancets (ONETOUCH ULTRASOFT) lancets USE   TO CHECK GLUCOSE ONCE DAILY  . lisinopril (ZESTRIL) 10 MG tablet Take 2 tablets (20 mg total) by mouth daily.  . metFORMIN (GLUCOPHAGE) 500 MG tablet Take 1 tablet (500 mg total) by mouth 2 (two) times daily with a meal.  . metoprolol succinate (TOPROL-XL) 50 MG 24 hr tablet Take 1 tablet (50 mg total) by mouth daily. Take with or immediately following a meal.  . rosuvastatin (CRESTOR) 40 MG tablet Take one tablet by mouth daily  . spironolactone (ALDACTONE) 25 MG tablet Take 1 tablet (25 mg total) by mouth daily.   No facility-administered encounter medications on file as of 05/26/2020.     REVIEW OF SYSTEMS  : All other systems reviewed and negative except where noted in the History of Present Illness.   PHYSICAL EXAM: BP 138/80   Pulse 65   Ht 5\' 5"  (1.651 m)   Wt 235 lb 9.6 oz (106.9 kg)   SpO2 98%   BMI 39.21 kg/m  General: Well developed AA female in no acute distress; patient ambulated in here with her walker today and says that she does not use it at home. Head: Normocephalic  and atraumatic Eyes:  Sclerae anicteric, conjunctiva pink. Ears: Normal auditory acuity Lungs: Clear throughout to auscultation; no increased WOB. Heart: Regular rate and rhythm; no M/R/G. Abdomen: Soft, non-distended.  BS present.  Non-tender. Rectal:  Will be done at the time of colonoscopy. Musculoskeletal: Symmetrical with no gross deformities  Skin: No lesions on visible extremities Extremities: No edema  Neurological: Alert oriented x 4, grossly non-focal Psychological:  Alert and cooperative. Normal mood and affect  ASSESSMENT AND PLAN: *CRC screening:  Never had one in the past.  No FH colon cancer.  No bowel issues/complaints.  We discussed colonoscopy vs Cologuard.  She ultimately decided to  proceed with colonoscopy.  Scheduled with Dr. Loletha Carrow.  The risks, benefits, and alternatives to colonoscopy were discussed with the patient and she consents to proceed.   CC:  Laurey Morale, MD

## 2020-05-27 ENCOUNTER — Other Ambulatory Visit: Payer: Self-pay | Admitting: Family Medicine

## 2020-05-27 NOTE — Progress Notes (Signed)
____________________________________________________________  Attending physician addendum:  Thank you for sending this case to me. I have reviewed the entire note, and the outlined plan seems appropriate.  Anjelique Makar Danis, MD  ____________________________________________________________  

## 2020-06-06 ENCOUNTER — Other Ambulatory Visit: Payer: Self-pay | Admitting: *Deleted

## 2020-06-06 NOTE — Patient Outreach (Signed)
[facility]) Care Management  [place]  06/06/2020   [name] 02/14/1946 [id number]  Subjective: Successful telephone outreach call to patient. HIPAA identifiers obtained. Patient states she is doing well. Patient reports that she saw Dr. Katy Fitch and will have cataract surgery on 06/16/20 as well as a colonoscopy on 07/26/20. Her FBS range from 98-164 and her postprandial blood sugars range from 140 to the occasional > 200 per patient when she has not followed her diet closely. Patient has increased her physical activity and reports that she is eating healthier which has resulted in her losing 5 pounds recently. Nurse praised patient for her effort and dedication to her health and wellness. Patient explains that she slipped out of her chair onto her bottom area over the weekend. She denies hurting herself but adds she was not able to get up by herself and her friend had to help her. Nurse will e-mail patient  [facility] Medicare contact number to inquire about receiving a life alert system for safety.   Encounter Medications:  Outpatient Encounter Medications as of 06/06/2020  Medication Sig  . acetaminophen (TYLENOL) 325 MG tablet Take 650 mg by mouth every 6 (six) hours as needed for headache (pain).  . Alcohol Swabs (ALCOHOL PREP) PADS Use to test 1 X day and diagnosis code is E 11.9  . amLODipine (NORVASC) 5 MG tablet Take 1 tablet (5 mg total) by mouth daily.  Marland Kitchen aspirin 81 MG chewable tablet Chew 1 tablet (81 mg total) by mouth daily.  . diclofenac (VOLTAREN) 75 MG EC tablet Take 1 tablet (75 mg total) by mouth 2 (two) times daily.  . furosemide (LASIX) 20 MG tablet Take 1 tablet by mouth once daily  . glipiZIDE (GLUCOTROL) 5 MG tablet Take 1 tablet (5 mg total) by mouth daily before breakfast.  . glucose blood (ONETOUCH VERIO) test strip USE FOR TESTING ONCE DAILY  . Lancets (ONETOUCH ULTRASOFT) lancets USE   TO CHECK GLUCOSE ONCE DAILY  . lisinopril (ZESTRIL) 10 MG  tablet Take 2 tablets by mouth once daily  . metFORMIN (GLUCOPHAGE) 500 MG tablet Take 1 tablet (500 mg total) by mouth 2 (two) times daily with a meal.  . metoprolol succinate (TOPROL-XL) 50 MG 24 hr tablet Take 1 tablet (50 mg total) by mouth daily. Take with or immediately following a meal.  . PEG-KCl-NaCl-NaSulf-Na Asc-C (PLENVU) 140 g SOLR Take 1 kit by mouth as directed.  . rosuvastatin (CRESTOR) 40 MG tablet Take one tablet by mouth daily  . spironolactone (ALDACTONE) 25 MG tablet Take 1 tablet (25 mg total) by mouth daily.   No facility-administered encounter medications on file as of 06/06/2020.    Functional Status:  In your present state of health, do you have any difficulty performing the following activities: 03/24/2020 06/27/2019  Hearing? N N  Vision? N N  Difficulty concentrating or making decisions? N N  Walking or climbing stairs? N N  Dressing or bathing? N N  Doing errands, shopping? N N  Preparing Food and eating ? N -  Using the Toilet? N -  In the past six months, have you accidently leaked urine? Y -  Comment occassionally if I wait too long -  Do you have problems with loss of bowel control? Y -  Comment Occassionally, it may come out a little -  Managing your Medications? N -  Managing your Finances? N -  Housekeeping or managing your Housekeeping? N -  Some recent data  might be hidden    Fall/Depression Screening: Fall Risk  06/06/2020 04/28/2020 03/24/2020  Falls in the past year? 1 0 0  Comment - - -  Number falls in past yr: 0 0 0  Injury with Fall? 0 0 0  Risk for fall due to : Impaired balance/gait;Impaired mobility Impaired balance/gait Impaired balance/gait  Follow up Falls prevention discussed;Education provided;Falls evaluation completed Falls prevention discussed;Education provided;Falls evaluation completed Falls prevention discussed;Education provided;Falls evaluation completed   PHQ 2/9 Scores 03/24/2020 01/23/2019 01/15/2018 02/14/2017 11/28/2016  07/23/2016 10/11/2014  PHQ - 2 Score 0 0 0 0 0 0 0  PHQ- 9 Score - 1 - - - - -   Goals Addressed            This Visit's Progress   . Patient will report reduction in A1c by 0.5-1 points within the next 90 days       [place] (see longtitudinal plan of care for additional care plan information)  Objective:  Lab Results  Component Value Date   HGBA1C 8.2 (H) 03/30/2020 .   Lab Results  Component Value Date   CREATININE 0.96 03/30/2020   CREATININE 0.97 06/29/2019   CREATININE 0.86 06/28/2019 .   Marland Kitchen No results found for: EGFR  Current Barriers:  Marland Kitchen Knowledge Deficits related to basic Diabetes pathophysiology and self care/management  Case Manager Clinical Goal(s):  Over the next 90 days, patient will demonstrate improved adherence to prescribed treatment plan for diabetes self care/management as evidenced by:  Marland Kitchen Verbalize daily monitoring and recording of CBG within 90 days . Verbalize adherence to ADA/ carb modified diet within the next 90 days . Verbalize exercise 2-3 days/week RN encouraged patient to continue with daily FBS monitoring, increase physical activity as tolerated, and to monitor sugar and carbohydrates closely.  Interventions:  . Patient Self Care Activities:  . Self administers oral medications as prescribed . Attends all scheduled provider appointments . Checks blood sugars as prescribed and utilize hyper and hypoglycemia protocol as needed . Adheres to prescribed ADA/carb modified          Plan: [place] will call patient within the month of September, will e-mail patient [facility] contact number to inquire about getting a life alert system through her insurance, and patient agrees to future outreach calls.   Emelia Loron RN, BSN [place] [phone] [email or web address]

## 2020-06-08 ENCOUNTER — Other Ambulatory Visit: Payer: Self-pay

## 2020-06-08 ENCOUNTER — Ambulatory Visit (INDEPENDENT_AMBULATORY_CARE_PROVIDER_SITE_OTHER): Payer: Medicare Other

## 2020-06-08 DIAGNOSIS — Z0000 Encounter for general adult medical examination without abnormal findings: Secondary | ICD-10-CM | POA: Diagnosis not present

## 2020-06-08 DIAGNOSIS — Z780 Asymptomatic menopausal state: Secondary | ICD-10-CM

## 2020-06-08 NOTE — Patient Instructions (Signed)
[name] , Thank you for taking time to come for your Medicare Wellness Visit. I appreciate your ongoing commitment to your health goals. Please review the following plan we discussed and let me know if I can assist you in the future.   Screening recommendations/referrals: Colonoscopy: Due, scheduled for 07/26/2020 @ 8 am with Dr. Loletha Carrow Mammogram: Up to date, next due 09/23/2019 Bone Density: Due, ordered this visit Recommended yearly ophthalmology/optometry visit for glaucoma screening and checkup Recommended yearly dental visit for hygiene and checkup  Vaccinations: Influenza vaccine: Up to date, next due 06/2020 Pneumococcal vaccine: up to date, next due 08/17/2020 Tdap vaccine: up to date, next due 07/17/2027 Shingles vaccine: Due, would like to get at next office visit    Advanced directives: Advance directive discussed with you today. Even though you declined this today please call our office should you change your mind and we can give you the proper paperwork for you to fill out.   Conditions/risks identified: Exercise for at least 30 minutes without stopping at least 3-5 days per week  Next appointment: 06/12/2021 @ 10:00am with Nurse Health advisor   Preventive Care 65 Years and Older, Female Preventive care refers to lifestyle choices and visits with your health care provider that can promote health and wellness. What does preventive care include?  A yearly physical exam. This is also called an annual well check.  Dental exams once or twice a year.  Routine eye exams. Ask your health care provider how often you should have your eyes checked.  Personal lifestyle choices, including:  Daily care of your teeth and gums.  Regular physical activity.  Eating a healthy diet.  Avoiding tobacco and drug use.  Limiting alcohol use.  Practicing safe sex.  Taking low-dose aspirin every day.  Taking vitamin and mineral supplements as recommended by your health care  provider. What happens during an annual well check? The services and screenings done by your health care provider during your annual well check will depend on your age, overall health, lifestyle risk factors, and family history of disease. Counseling  Your health care provider may ask you questions about your:  Alcohol use.  Tobacco use.  Drug use.  Emotional well-being.  Home and relationship well-being.  Sexual activity.  Eating habits.  History of falls.  Memory and ability to understand (cognition).  Work and work Statistician.  Reproductive health. Screening  You may have the following tests or measurements:  Height, weight, and BMI.  Blood pressure.  Lipid and cholesterol levels. These may be checked every 5 years, or more frequently if you are over 33 years old.  Skin check.  Lung cancer screening. You may have this screening every year starting at age 33 if you have a 30-pack-year history of smoking and currently smoke or have quit within the past 15 years.  Fecal occult blood test (FOBT) of the stool. You may have this test every year starting at age 38.  Flexible sigmoidoscopy or colonoscopy. You may have a sigmoidoscopy every 5 years or a colonoscopy every 10 years starting at age 55.  Hepatitis C blood test.  Hepatitis B blood test.  Sexually transmitted disease (STD) testing.  Diabetes screening. This is done by checking your blood sugar (glucose) after you have not eaten for a while (fasting). You may have this done every 1-3 years.  Bone density scan. This is done to screen for osteoporosis. You may have this done starting at age 53.  Mammogram. This may be  done every 1-2 years. Talk to your health care provider about how often you should have regular mammograms. Talk with your health care provider about your test results, treatment options, and if necessary, the need for more tests. Vaccines  Your health care provider may recommend certain  vaccines, such as:  Influenza vaccine. This is recommended every year.  Tetanus, diphtheria, and acellular pertussis (Tdap, Td) vaccine. You may need a Td booster every 10 years.  Zoster vaccine. You may need this after age 42.  Pneumococcal 13-valent conjugate (PCV13) vaccine. One dose is recommended after age 92.  Pneumococcal polysaccharide (PPSV23) vaccine. One dose is recommended after age 69. Talk to your health care provider about which screenings and vaccines you need and how often you need them. This information is not intended to replace advice given to you by your health care provider. Make sure you discuss any questions you have with your health care provider. Document Released: 12/09/2015 Document Revised: 08/01/2016 Document Reviewed: 09/13/2015 Elsevier Interactive Patient Education  2017 [place] Prevention in the Home Falls can cause injuries. They can happen to people of all ages. There are many things you can do to make your home safe and to help prevent falls. What can I do on the outside of my home?  Regularly fix the edges of walkways and driveways and fix any cracks.  Remove anything that might make you trip as you walk through a door, such as a raised step or threshold.  Trim any bushes or trees on the path to your home.  Use bright outdoor lighting.  Clear any walking paths of anything that might make someone trip, such as rocks or tools.  Regularly check to see if handrails are loose or broken. Make sure that both sides of any steps have handrails.  Any raised decks and porches should have guardrails on the edges.  Have any leaves, snow, or ice cleared regularly.  Use sand or salt on walking paths during winter.  Clean up any spills in your garage right away. This includes oil or grease spills. What can I do in the bathroom?  Use night lights.  Install grab bars by the toilet and in the tub and shower. Do not use towel bars as grab  bars.  Use non-skid mats or decals in the tub or shower.  If you need to sit down in the shower, use a plastic, non-slip stool.  Keep the floor dry. Clean up any water that spills on the floor as soon as it happens.  Remove soap buildup in the tub or shower regularly.  Attach bath mats securely with double-sided non-slip rug tape.  Do not have throw rugs and other things on the floor that can make you trip. What can I do in the bedroom?  Use night lights.  Make sure that you have a light by your bed that is easy to reach.  Do not use any sheets or blankets that are too big for your bed. They should not hang down onto the floor.  Have a firm chair that has side arms. You can use this for support while you get dressed.  Do not have throw rugs and other things on the floor that can make you trip. What can I do in the kitchen?  Clean up any spills right away.  Avoid walking on wet floors.  Keep items that you use a lot in easy-to-reach places.  If you need to reach something above you, use  a strong step stool that has a grab bar.  Keep electrical cords out of the way.  Do not use floor polish or wax that makes floors slippery. If you must use wax, use non-skid floor wax.  Do not have throw rugs and other things on the floor that can make you trip. What can I do with my stairs?  Do not leave any items on the stairs.  Make sure that there are handrails on both sides of the stairs and use them. Fix handrails that are broken or loose. Make sure that handrails are as long as the stairways.  Check any carpeting to make sure that it is firmly attached to the stairs. Fix any carpet that is loose or worn.  Avoid having throw rugs at the top or bottom of the stairs. If you do have throw rugs, attach them to the floor with carpet tape.  Make sure that you have a light switch at the top of the stairs and the bottom of the stairs. If you do not have them, ask someone to add them for  you. What else can I do to help prevent falls?  Wear shoes that:  Do not have high heels.  Have rubber bottoms.  Are comfortable and fit you well.  Are closed at the toe. Do not wear sandals.  If you use a stepladder:  Make sure that it is fully opened. Do not climb a closed stepladder.  Make sure that both sides of the stepladder are locked into place.  Ask someone to hold it for you, if possible.  Clearly mark and make sure that you can see:  Any grab bars or handrails.  First and last steps.  Where the edge of each step is.  Use tools that help you move around (mobility aids) if they are needed. These include:  Canes.  Walkers.  Scooters.  Crutches.  Turn on the lights when you go into a dark area. Replace any light bulbs as soon as they burn out.  Set up your furniture so you have a clear path. Avoid moving your furniture around.  If any of your floors are uneven, fix them.  If there are any pets around you, be aware of where they are.  Review your medicines with your doctor. Some medicines can make you feel dizzy. This can increase your chance of falling. Ask your doctor what other things that you can do to help prevent falls. This information is not intended to replace advice given to you by your health care provider. Make sure you discuss any questions you have with your health care provider. Document Released: 09/08/2009 Document Revised: 04/19/2016 Document Reviewed: 12/17/2014 Elsevier Interactive Patient Education  2017 [employer].

## 2020-06-08 NOTE — Progress Notes (Signed)
Subjective:   [name] is a 74 y.o. female who presents for Medicare Annual (Subsequent) preventive examination.  I connected with [name]  today by telephone and verified that I am speaking with the correct person using two identifiers. Location patient: home Location provider: work Persons participating in the virtual visit: patient, provider.   I discussed the limitations, risks, security and privacy concerns of performing an evaluation and management service by telephone and the availability of in person appointments. I also discussed with the patient that there may be a patient responsible charge related to this service. The patient expressed understanding and verbally consented to this telephonic visit.    Interactive audio and video telecommunications were attempted between this provider and patient, however failed, due to patient having technical difficulties OR patient did not have access to video capability.  We continued and completed visit with audio only.     Review of Systems    N/A  Cardiac Risk Factors include: advanced age (>72mn, >>10women);diabetes mellitus;dyslipidemia;hypertension;smoking/ tobacco exposure     Objective:    Today's Vitals   There is no height or weight on file to calculate BMI.  Advanced Directives 06/08/2020 04/28/2020 03/24/2020 06/27/2019 01/23/2019 01/15/2018 02/14/2017  Does Patient Have a Medical Advance Directive? Yes No No No No No No  Type of AParamedicof ASourisLiving will - - - - - -  Does patient want to make changes to medical advance directive? No - Patient declined - - - - - -  Copy of [place] Chart? No - copy requested - - - - - -  Would patient like information on creating a medical advance directive? - No - Patient declined Yes (ED - Information included in AVS) No - Patient declined Yes (MAU/Ambulatory/Procedural Areas - Information given) - No - Patient declined     Current Medications (verified) Outpatient Encounter Medications as of 06/08/2020  Medication Sig   Alcohol Swabs (ALCOHOL PREP) PADS Use to test 1 X day and diagnosis code is E 11.9   amLODipine (NORVASC) 5 MG tablet Take 1 tablet (5 mg total) by mouth daily.   aspirin 81 MG chewable tablet Chew 1 tablet (81 mg total) by mouth daily.   diclofenac (VOLTAREN) 75 MG EC tablet Take 1 tablet (75 mg total) by mouth 2 (two) times daily.   furosemide (LASIX) 20 MG tablet Take 1 tablet by mouth once daily   glipiZIDE (GLUCOTROL) 5 MG tablet Take 1 tablet (5 mg total) by mouth daily before breakfast.   glucose blood (ONETOUCH VERIO) test strip USE FOR TESTING ONCE DAILY   Lancets (ONETOUCH ULTRASOFT) lancets USE   TO CHECK GLUCOSE ONCE DAILY   lisinopril (ZESTRIL) 10 MG tablet Take 2 tablets by mouth once daily   metFORMIN (GLUCOPHAGE) 500 MG tablet Take 1 tablet (500 mg total) by mouth 2 (two) times daily with a meal.   metoprolol succinate (TOPROL-XL) 50 MG 24 hr tablet Take 1 tablet (50 mg total) by mouth daily. Take with or immediately following a meal.   rosuvastatin (CRESTOR) 40 MG tablet Take one tablet by mouth daily   spironolactone (ALDACTONE) 25 MG tablet Take 1 tablet (25 mg total) by mouth daily.   acetaminophen (TYLENOL) 325 MG tablet Take 650 mg by mouth every 6 (six) hours as needed for headache (pain). (Patient not taking: Reported on 06/08/2020)   PEG-KCl-NaCl-NaSulf-Na Asc-C (PLENVU) 140 g SOLR Take 1 kit by mouth as directed. (Patient  not taking: Reported on 06/08/2020)   No facility-administered encounter medications on file as of 06/08/2020.    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    Hx raising of her deceased sister's 3 children, and is now raising/supporting 10yo  'great-grandson'.    Attends church, church community as support system. Many of her family has passed away.   Enjoys watching TV; used to enjoy going fishing         Social Determinants of Radio broadcast assistant Strain: Low Risk    Difficulty of Paying Living Expenses: Not hard at all  Food Insecurity: No Food Insecurity   Worried About Charity fundraiser in the Last Year: Never true   Arboriculturist in the Last Year: Never true  Transportation Needs:    Film/video editor (Medical):    Lack of Transportation (Non-Medical):   Physical Activity:    Days of Exercise per Week:    Minutes of Exercise per Session:    Stress: No Stress Concern Present   Feeling of Stress : Not at all  Social Connections: Moderately Isolated   Frequency of Communication with Friends and Family: More than three times a week   Frequency of Social Gatherings with Friends and Family: More than three times a week   Attends Religious Services: More than 4 times per year   Active Member of [employer] or Organizations: No   Attends Archivist Meetings: Never   Marital Status: Widowed    Tobacco Counseling Ready to quit: Yes Counseling given: Not Answered Comment: trying to quit;  not really motivated at present   Clinical Intake:  Pre-visit preparation completed: Yes  Pain : No/denies pain     Nutritional Risks: None  Diabetes: Yes CBG done?: No CBG resulted in Enter/ Edit results?: No Did pt. bring in CBG monitor from home?: No (Takes glucose 2x per day when she eats)  How often do you need to have someone help you when you read instructions, pamphlets, or other written materials from your doctor or pharmacy?: 1 - Never What is the last grade level you completed in school?: 12th grade  Diabetic?Yes  Interpreter Needed?: No  Information entered by :: [place] of Daily Living In your present state of health, do you have any difficulty performing the following activities: 06/08/2020 03/24/2020  Hearing? N N  Vision? Y N  Comment Has a cataract in left eye is scheduled next Thursday -  Difficulty concentrating or making decisions? Y N  Comment Has issues at times with short term memory -  Walking or climbing stairs? N N  Dressing or bathing? N N  Doing errands, shopping? N N  Preparing Food and eating ? N N  Using the Toilet? N N  In the past six months, have you accidently leaked urine? N Y  Comment - occassionally if I wait too long  Do you have problems with loss of bowel control? Y Y  Comment States has some issues controlling bowels since taking metformin Occassionally, it  may come out a little  Managing your Medications? N N  Managing your Finances? N N  Housekeeping or managing your Housekeeping? N N  Some recent data might be hidden    Patient Care Team: Laurey Morale, MD as PCP - [facility] Wine, Argie Ramming, RN as [place] any recent Medical Services you may have received from other than [facility] providers in the past year (date may be approximate).     Assessment:   This is a routine wellness examination for [name].  Hearing/Vision screen  Hearing Screening   '125Hz'  '250Hz'  '500Hz'  '1000Hz'  '2000Hz'  '3000Hz'  '4000Hz'  '6000Hz'  '8000Hz'   Right ear:           Left ear:           Vision Screening Comments: Patient gets annual eye exam   Dietary issues and exercise activities discussed: Current Exercise Habits: Home exercise routine (Patient does leg exercises), Type of exercise: stretching, Time (Minutes): 15, Frequency (Times/Week): 7, Weekly Exercise (Minutes/Week): 105, Intensity: Mild, Exercise limited by: orthopedic condition(s)  Goals     Exercise 150 minutes per week (moderate activity)     May consider going back to the y; riding the bike      Patient Stated     Go back to the gym; [name]  Will take exercise class, silver sneaker Ride the bike         Patient Stated     Return to [facility] doing low-impact exercise Go fishing with your great-grandson      Patient Stated     I will continue to drink 6-8 cups of water per day     Patient will report reduction in A1c by 0.5-1 points within the next 90 days     [place] (see longtitudinal plan of care for additional care plan information)  Objective:  Lab Results  Component Value Date   HGBA1C 8.2 (H) 03/30/2020    Lab Results  Component Value Date   CREATININE 0.96 03/30/2020   CREATININE 0.97 06/29/2019   CREATININE 0.86 06/28/2019     No results found for: EGFR  Current Barriers:  Knowledge  Deficits related to basic Diabetes pathophysiology and self care/management  Case Manager Clinical Goal(s):  Over the next 90 days, patient will demonstrate improved adherence to prescribed treatment plan for diabetes self care/management as evidenced by:   Verbalize daily monitoring and recording of CBG within 90 days  Verbalize adherence to ADA/ carb modified diet within the next 90 days  Verbalize exercise 2-3 days/week RN encouraged patient to continue with daily FBS monitoring, increase physical activity as tolerated, and to monitor sugar and carbohydrates closely.  Interventions:   Patient Self Care Activities:   Self administers oral medications as prescribed  Attends all scheduled provider appointments  Checks blood sugars as prescribed and utilize hyper and hypoglycemia protocol as needed  Adheres to prescribed ADA/carb modified        Weight (lb) < 200 lb (90.7 kg)     Cut back on portions and get eating under control          Depression Screen PHQ 2/9 Scores 06/08/2020 03/24/2020 01/23/2019 01/15/2018 02/14/2017 11/28/2016 07/23/2016  PHQ - 2 Score 0 0 0 0 0 0 0  PHQ- 9 Score 0 - 1 - - - -    Fall Risk Fall Risk  06/08/2020 06/06/2020 04/28/2020 03/24/2020 07/21/2019  Falls in the past year? 1 1 0 0 0  Comment - - - - -  Number falls in past yr: 0 0 0 0 -  Injury with Fall? 0 0 0 0 -  Risk for fall due to : Orthopedic patient Impaired balance/gait;Impaired mobility Impaired balance/gait Impaired balance/gait -  Follow up Falls evaluation completed;Falls prevention discussed Falls prevention discussed;Education provided;Falls evaluation completed Falls prevention discussed;Education provided;Falls evaluation completed Falls prevention discussed;Education provided;Falls evaluation completed -    Any stairs in or around the home? No  If so, are there any without handrails? No  Home free of loose throw rugs in walkways, pet beds, electrical cords, etc? Yes Adequate  lighting in your home to reduce risk of falls? Yes   ASSISTIVE DEVICES UTILIZED TO PREVENT FALLS:  Life alert? Yes  Use of a cane, walker or w/c? Yes , uses a cane  Grab bars in the bathroom? No  Shower chair or bench in shower? Yes  Elevated toilet seat or a handicapped toilet? Yes     Cognitive Function:     6CIT Screen 06/08/2020  What Year? 0 points  What month? 0 points  What time? 0 points  Count back from 20 0 points  Months in reverse 0 points  Repeat phrase 0 points  Total Score 0    Immunizations Immunization History  Administered Date(s) Administered   Fluad Quad(high Dose 65+) 08/18/2019   Influenza, High Dose Seasonal PF 10/17/2015, 11/28/2016, 12/24/2017, 01/23/2019   Influenza, Seasonal, Injecte, Preservative Fre 12/01/2012   Influenza,inj,Quad PF,6+ Mos 09/07/2013, 10/11/2014   PFIZER SARS-COV-2 Vaccination 01/28/2020, 02/24/2020   Pneumococcal Conjugate-13 08/18/2019   Td 12/22/2007   Tdap 07/16/2017    TDAP status: Up to date Flu Vaccine status: Up to date Pneumococcal vaccine status: Up to date Covid-19 vaccine status: Completed vaccines  Qualifies for Shingles Vaccine? Yes   Zostavax completed No   Shingrix Completed?: No.    Education has been provided regarding the importance of this vaccine. Patient has been advised to call insurance company to determine out of pocket expense if they have not yet received this vaccine. Advised may also receive vaccine at local pharmacy or Health Dept. Verbalized acceptance and understanding.  Screening Tests  Health Maintenance  Topic Date Due   Fecal DNA (Cologuard)  Never done   DEXA SCAN  Never done   FOOT EXAM  12/27/2018   OPHTHALMOLOGY EXAM  12/09/2019   INFLUENZA VACCINE  06/26/2020   PNA vac Low Risk Adult (2 of 2 - PPSV23) 08/17/2020   HEMOGLOBIN A1C  09/30/2020   MAMMOGRAM  09/22/2021   TETANUS/TDAP  07/17/2027   COVID-19 Vaccine  Completed   Hepatitis C Screening  Completed     Health Maintenance  Health Maintenance Due  Topic Date Due   Fecal DNA (Cologuard)  Never done   DEXA SCAN  Never done   FOOT EXAM  12/27/2018   OPHTHALMOLOGY EXAM  12/09/2019    Colorectal cancer screening: Completed 07/26/2020. Repeat every 10 years Mammogram status: Completed 09/23/2019. Repeat every year Bone Density status: Ordered 06/08/2020. Pt provided with contact info and advised to call to schedule appt.  Lung Cancer Screening: (Low Dose CT Chest recommended if Age 35-80 years, 30 pack-year currently smoking OR have quit w/in 15years.) does qualify.   Lung Cancer Screening Referral: No  Additional Screening:  Hepatitis C Screening: does qualify; Completed 11/26/2016  Vision Screening: Recommended annual ophthalmology exams for early detection of glaucoma and other disorders of the eye. Is the patient up to date with their annual eye exam?  Yes  Who is the provider or what is the name of the office in which the patient attends annual eye exams? Dr. Elie Confer  If pt is not established with a provider, would they like to be referred to a provider to establish care? No .   Dental Screening: Recommended annual dental exams for proper oral hygiene  Community Resource Referral / Chronic Care Management: CRR required this visit?  No   CCM required this visit?  No      Plan:     I have personally reviewed and noted the following in the patients chart:    Medical and social history  Use of alcohol, tobacco or illicit drugs   Current medications and supplements  Functional ability and status  Nutritional status  Physical activity  Advanced directives  List of other physicians  Hospitalizations, surgeries, and ER visits in previous 12 months  Vitals  Screenings to include cognitive, depression, and falls  Referrals and appointments  In addition, I have reviewed and discussed with patient certain preventive protocols, quality metrics, and best  practice recommendations. A written personalized care plan for preventive services as well as general preventive health recommendations were provided to patient.     Ofilia Neas, LPN   02/07/3887   Nurse Notes: Patient would like to get shingrix and Pneumovax 23 at next office visit.

## 2020-06-12 DIAGNOSIS — H2512 Age-related nuclear cataract, left eye: Secondary | ICD-10-CM | POA: Diagnosis not present

## 2020-06-13 ENCOUNTER — Other Ambulatory Visit: Payer: Self-pay | Admitting: Family Medicine

## 2020-06-13 DIAGNOSIS — E2839 Other primary ovarian failure: Secondary | ICD-10-CM

## 2020-06-16 DIAGNOSIS — H2512 Age-related nuclear cataract, left eye: Secondary | ICD-10-CM | POA: Diagnosis not present

## 2020-07-01 DIAGNOSIS — E119 Type 2 diabetes mellitus without complications: Secondary | ICD-10-CM | POA: Diagnosis not present

## 2020-07-01 DIAGNOSIS — M25373 Other instability, unspecified ankle: Secondary | ICD-10-CM | POA: Diagnosis not present

## 2020-07-15 ENCOUNTER — Encounter: Payer: Self-pay | Admitting: Gastroenterology

## 2020-07-18 DIAGNOSIS — Z961 Presence of intraocular lens: Secondary | ICD-10-CM | POA: Diagnosis not present

## 2020-07-23 ENCOUNTER — Other Ambulatory Visit: Payer: Self-pay | Admitting: Family Medicine

## 2020-07-25 ENCOUNTER — Other Ambulatory Visit: Payer: Self-pay | Admitting: Family Medicine

## 2020-07-26 ENCOUNTER — Other Ambulatory Visit: Payer: Self-pay

## 2020-07-26 ENCOUNTER — Ambulatory Visit (AMBULATORY_SURGERY_CENTER): Payer: Medicare Other | Admitting: Gastroenterology

## 2020-07-26 ENCOUNTER — Encounter: Payer: Self-pay | Admitting: Gastroenterology

## 2020-07-26 VITALS — BP 166/78 | HR 70 | Temp 96.8°F | Resp 22 | Ht 65.0 in | Wt 235.0 lb

## 2020-07-26 DIAGNOSIS — Z1211 Encounter for screening for malignant neoplasm of colon: Secondary | ICD-10-CM | POA: Diagnosis not present

## 2020-07-26 HISTORY — PX: COLONOSCOPY: SHX174

## 2020-07-26 MED ORDER — SODIUM CHLORIDE 0.9 % IV SOLN: 500.0000 mL | Freq: Once | INTRAVENOUS | Status: DC

## 2020-07-26 NOTE — Op Note (Addendum)
[name] Patient Name: Jakeya Gherardi Procedure Date: 07/26/2020 7:57 AM MRN: [id number] Endoscopist: Mallie Mussel L. Loletha Carrow , MD Age: 74 Referring MD:  Date of Birth: 19-May-1946 Gender: Female Account #: [id number] Procedure:                Colonoscopy Indications:              Screening for colorectal malignant neoplasm, This                            is the patient's first colonoscopy Medicines:                Monitored Anesthesia Care Procedure:                Pre-Anesthesia Assessment:                           - Prior to the procedure, a History and Physical                            was performed, and patient medications and                            allergies were reviewed. The patient's tolerance of                            previous anesthesia was also reviewed. The risks                            and benefits of the procedure and the sedation                            options and risks were discussed with the patient.                            All questions were answered, and informed consent                            was obtained. Prior Anticoagulants: The patient has                            taken no previous anticoagulant or antiplatelet                            agents except for aspirin. ASA Grade Assessment:                            III - A patient with severe systemic disease. After                            reviewing the risks and benefits, the patient was                            deemed in satisfactory condition to undergo the  procedure.                           After obtaining informed consent, the colonoscope                            was passed under direct vision. Throughout the                            procedure, the patient's blood pressure, pulse, and                            oxygen saturations were monitored continuously. The                            Colonoscope was introduced through the anus and                             advanced to the the cecum, identified by                            appendiceal orifice and ileocecal valve. The                            colonoscopy was somewhat difficult due to a                            redundant colon and significant looping. Successful                            completion of the procedure was aided by using                            manual pressure. The quality of the bowel                            preparation was excellent. The ileocecal valve,                            appendiceal orifice, and rectum were photographed. Scope In: 8:07:36 AM Scope Out: 8:25:02 AM Scope Withdrawal Time: 0 hours 9 minutes 32 seconds  Total Procedure Duration: 0 hours 17 minutes 26 seconds  Findings:                 The perianal and digital rectal examinations were                            normal.                           Multiple diverticula were found in the left colon.                           The exam was otherwise without abnormality on  direct and retroflexion views. Complications:            No immediate complications. Estimated Blood Loss:     Estimated blood loss: none. Impression:               - Diverticulosis in the left colon.                           - The examination was otherwise normal on direct                            and retroflexion views.                           - No specimens collected. Recommendation:           - Patient has a contact number available for                            emergencies. The signs and symptoms of potential                            delayed complications were discussed with the                            patient. Return to normal activities tomorrow.                            Written discharge instructions were provided to the                            patient.                           - Resume previous diet.                           - Continue present medications.                            - Based on current guidelines and age, no repeat                            screening colonoscopy recommended. Annual FOBT or                            cologuard also not recommended. Sharonne Ricketts L. Loletha Carrow, MD 07/26/2020 8:33:21 AM This report has been signed electronically.

## 2020-07-26 NOTE — Progress Notes (Signed)
History reviewed today  VS CW  

## 2020-07-26 NOTE — Patient Instructions (Signed)
Read all of your discharge instructions.  Call us if you have any problems or questions.  YOU HAD AN ENDOSCOPIC PROCEDURE TODAY AT [facility]:   Refer to the procedure report that was given to you for any specific questions about what was found during the examination.  If the procedure report does not answer your questions, please call your gastroenterologist to clarify.  If you requested that your care partner not be given the details of your procedure findings, then the procedure report has been included in a sealed envelope for you to review at your convenience later.  YOU SHOULD EXPECT: Some feelings of bloating in the abdomen. Passage of more gas than usual.  Walking can help get rid of the air that was put into your GI tract during the procedure and reduce the bloating. If you had a lower endoscopy (such as a colonoscopy or flexible sigmoidoscopy) you may notice spotting of blood in your stool or on the toilet paper. If you underwent a bowel prep for your procedure, you may not have a normal bowel movement for a few days.  Please Note:  You might notice some irritation and congestion in your nose or some drainage.  This is from the oxygen used during your procedure.  There is no need for concern and it should clear up in a day or so.  SYMPTOMS TO REPORT IMMEDIATELY:   Following lower endoscopy (colonoscopy or flexible sigmoidoscopy):  Excessive amounts of blood in the stool  Significant tenderness or worsening of abdominal pains  Swelling of the abdomen that is new, acute  Fever of 100F or higher   For urgent or emergent issues, a gastroenterologist can be reached at any hour by calling [phone]. Do not use MyChart messaging for urgent concerns.    DIET:  We do recommend a small meal at first, but then you may proceed to your regular diet.  Drink plenty of fluids but you should avoid alcoholic beverages for 24 hours.  ACTIVITY:  You should plan to take it easy  for the rest of today and you should NOT DRIVE or use heavy machinery until tomorrow (because of the sedation medicines used during the test).    FOLLOW UP: Our staff will call the number listed on your records 48-72 hours following your procedure to check on you and address any questions or concerns that you may have regarding the information given to you following your procedure. If we do not reach you, we will leave a message.  We will attempt to reach you two times.  During this call, we will ask if you have developed any symptoms of COVID 19. If you develop any symptoms (ie: fever, flu-like symptoms, shortness of breath, cough etc.) before then, please call [phone].  If you test positive for Covid 19 in the 2 weeks post procedure, please call and report this information to [place].     SIGNATURES/CONFIDENTIALITY: You and/or your care partner have signed paperwork which will be entered into your electronic medical record.  These signatures attest to the fact that that the information above on your After Visit Summary has been reviewed and is understood.  Full responsibility of the confidentiality of this discharge information lies with you and/or your care-partner.

## 2020-07-26 NOTE — Progress Notes (Signed)
PT taken to PACU. Monitors in place. VSS. Report given to RN. 

## 2020-07-28 ENCOUNTER — Telehealth: Payer: Self-pay | Admitting: *Deleted

## 2020-07-28 NOTE — Telephone Encounter (Signed)
  Follow up Call-  Call back number 07/26/2020  Post procedure Call Back phone  # [phone]  Permission to leave phone message Yes  Some recent data might be hidden     Patient questions:  Do you have a fever, pain , or abdominal swelling? No. Pain Score  0 *  Have you tolerated food without any problems? Yes.    Have you been able to return to your normal activities?  Yes  Do you have any questions about your discharge instructions: Diet   No. Medications  No. Follow up visit  No.  Do you have questions or concerns about your Care? No.  Actions: * If pain score is 4 or above: No action needed, pain <4.  1. Have you developed a fever since your procedure? no  2.   Have you had an respiratory symptoms (SOB or cough) since your procedure? no  3.   Have you tested positive for COVID 19 since your procedure no  4.   Have you had any family members/close contacts diagnosed with the COVID 19 since your procedure?  no   If yes to any of these questions please route to Joylene John, RN and Joella Prince, RN

## 2020-08-08 ENCOUNTER — Other Ambulatory Visit: Payer: Self-pay | Admitting: *Deleted

## 2020-08-08 NOTE — Patient Outreach (Signed)
[name]) Care Management  [place]  08/08/2020   [name] 05-25-1946 [id number]  Subjective: Successful telephone outreach call to patient. HIPAA identifiers obtained. Patient reports that both her cataract surgery and colonoscopy went well and without any issues or complications. Patient states that she has not been doing well with following her diabetic diet. She explained that the [facility] nurse made a home visit several days ago and she is currently motivated to eat better; stating "I know what I need to do I just need to do it."  Patient reports clearing out of her cabinets what she knows she should not eat. Patient explains that in the last few days since she has committed to doing better her FBS has been 130-150 and her postprandial readings have been around 150-170. She does admit that prior to getting back on track she often had readings >200. Nurse praised her for making the commitment to get her diabetes back under control. Patient denies any recent falls and continues to feel well emotionally. Per patient her friend Fulton Reek is very supportive and she has everything she needs at this time to maintain a healthy and safe home environment.  Encounter Medications:  Outpatient Encounter Medications as of 08/08/2020  Medication Sig  . acetaminophen (TYLENOL) 325 MG tablet Take 650 mg by mouth every 6 (six) hours as needed for headache (pain). (Patient not taking: Reported on 06/08/2020)  . Alcohol Swabs (ALCOHOL PREP) PADS Use to test 1 X day and diagnosis code is E 11.9  . amLODipine (NORVASC) 5 MG tablet Take 1 tablet (5 mg total) by mouth daily.  Marland Kitchen aspirin 81 MG chewable tablet Chew 1 tablet (81 mg total) by mouth daily.  . diclofenac (VOLTAREN) 75 MG EC tablet Take 1 tablet (75 mg total) by mouth 2 (two) times daily.  . furosemide (LASIX) 20 MG tablet Take 1 tablet by mouth once daily  . glipiZIDE (GLUCOTROL) 5 MG tablet Take 1 tablet (5 mg total) by mouth  daily before breakfast.  . glucose blood (ONETOUCH VERIO) test strip USE 1 STRIP TO CHECK GLUCOSE ONCE DAILY  . Lancets (ONETOUCH ULTRASOFT) lancets USE   TO CHECK GLUCOSE ONCE DAILY  . lisinopril (ZESTRIL) 10 MG tablet Take 2 tablets by mouth once daily  . metFORMIN (GLUCOPHAGE) 500 MG tablet Take 1 tablet (500 mg total) by mouth 2 (two) times daily with a meal.  . metoprolol succinate (TOPROL-XL) 50 MG 24 hr tablet Take 1 tablet (50 mg total) by mouth daily. Take with or immediately following a meal.  . rosuvastatin (CRESTOR) 40 MG tablet Take one tablet by mouth daily  . spironolactone (ALDACTONE) 25 MG tablet Take 1 tablet (25 mg total) by mouth daily.   No facility-administered encounter medications on file as of 08/08/2020.    Functional Status:  In your present state of health, do you have any difficulty performing the following activities: 06/08/2020 03/24/2020  Hearing? N N  Vision? Y N  Comment Has a cataract in left eye is scheduled next Thursday -  Difficulty concentrating or making decisions? Y N  Comment Has issues at times with short term memory -  Walking or climbing stairs? N N  Dressing or bathing? N N  Doing errands, shopping? N N  Preparing Food and eating ? N N  Using the Toilet? N N  In the past six months, have you accidently leaked urine? N Y  Comment - occassionally if I wait too long  Do  you have problems with loss of bowel control? Y Y  Comment States has some issues controlling bowels since taking metformin Occassionally, it may come out a little  Managing your Medications? N N  Managing your Finances? N N  Housekeeping or managing your Housekeeping? N N  Some recent data might be hidden    Fall/Depression Screening: Fall Risk  08/08/2020 06/08/2020 06/06/2020  Falls in the past year? _0 Comment - - -  Number falls in past yr: 0 0 0  Injury with Fall? 0 0 0  Risk for fall due to : Impaired balance/gait;Impaired mobility Orthopedic patient Impaired  balance/gait;Impaired mobility  Follow up Falls prevention discussed;Falls evaluation completed;Education provided Falls evaluation completed;Falls prevention discussed Falls prevention discussed;Education provided;Falls evaluation completed   PHQ 2/9 Scores 06/08/2020 03/24/2020 01/23/2019 01/15/2018 02/14/2017 11/28/2016 07/23/2016  PHQ - 2 Score 0 0 0 0 0 0 0  PHQ- 9 Score 0 - 1 - - - -   Goals Addressed            This Visit's Progress   . Patient will report reduction in A1c by 0.5-1 points within the next 90 days   Not on track    [place] (see longtitudinal plan of care for additional care plan information)  Objective:  Lab Results  Component Value Date   HGBA1C 8.2 (H) 03/30/2020 .   Lab Results  Component Value Date   CREATININE 0.96 03/30/2020   CREATININE 0.97 06/29/2019   CREATININE 0.86 06/28/2019 .   Marland Kitchen No results found for: EGFR  Current Barriers:  Marland Kitchen Knowledge Deficits related to basic Diabetes pathophysiology and self care/management  Case Manager Clinical Goal(s):  Over the next 90 days, patient will demonstrate improved adherence to prescribed treatment plan for diabetes self care/management as evidenced by:  Marland Kitchen Verbalize daily monitoring and recording of CBG within 90 days . Verbalize adherence to ADA/ carb modified diet within the next 90 days . Verbalize exercise 2-3 days/week RN encouraged patient to continue with daily FBS monitoring, increase physical activity as tolerated, and to monitor sugar and carbohydrates closely. RN discussed decreasing portion sizes and other weight loss tips with patient.  Interventions:  . Patient Self Care Activities:  . Self administers oral medications as prescribed . Attends all scheduled provider appointments . Checks blood sugars as prescribed and utilize hyper and hypoglycemia protocol as needed . Patient states that she has not very well sticking to her ADA diet. She is now more motivated to get back on trach and  states she has all of the diabetic education she needs, "she just needs to eat right and do better".   Please see past updates related to this goal by clicking on the "Past Updates" button in the selected goal       Plan: [place] will send PCP today's assessment note, will call patient within the month of December, and patient agrees to future outreach calls.   Emelia Loron RN, BSN [place] [phone] [email or web address]

## 2020-08-25 ENCOUNTER — Other Ambulatory Visit: Payer: Self-pay

## 2020-08-25 MED ORDER — LISINOPRIL 10 MG PO TABS
20.0000 mg | ORAL_TABLET | Freq: Every day | ORAL | 0 refills | Status: DC
Start: 2020-08-25 — End: 2020-09-16

## 2020-09-15 ENCOUNTER — Other Ambulatory Visit: Payer: Self-pay | Admitting: Family Medicine

## 2020-10-06 ENCOUNTER — Encounter: Payer: Self-pay | Admitting: Family Medicine

## 2020-10-06 DIAGNOSIS — Z1231 Encounter for screening mammogram for malignant neoplasm of breast: Secondary | ICD-10-CM | POA: Diagnosis not present

## 2020-10-29 ENCOUNTER — Other Ambulatory Visit: Payer: Self-pay | Admitting: Family Medicine

## 2020-11-07 ENCOUNTER — Other Ambulatory Visit: Payer: Self-pay | Admitting: *Deleted

## 2020-11-07 NOTE — Patient Outreach (Signed)
[facility]) Care Management  11/07/2020  RANITA STJULIEN Nov 27, 1945 [id number]  Unsuccessful outreach attempt made to patient. RN Health Coach left HIPAA compliant voicemail message along with her contact information.  Plan: RN Health Coach will call patient within the month of January.  Emelia Loron RN, BSN [place] [phone] [email or web address]

## 2020-12-15 ENCOUNTER — Encounter: Payer: Self-pay | Admitting: *Deleted

## 2020-12-15 ENCOUNTER — Other Ambulatory Visit: Payer: Self-pay | Admitting: *Deleted

## 2020-12-15 NOTE — Patient Outreach (Addendum)
[place] [facility]) Care Management  12/15/2020  [name] 1946/02/02 [id number]  Successful telephone outreach call to patient. HIPAA identifiers obtained. Patient reports she feels good. Patient states she has not had any recent falls, her home environment is safe, and she has good support from her friend [name]. Patient admits that she has not been following a strict diabetic diet and has not been eating healthy overall. Patient denies any needs for written education explaining that she already has the material and added "I know what I need to do I just need to do it."  Patient agreed that she should work towards keeping diabetes under control.  Patient states her blood sugar ranges have been 125-300 and her last A1c was 8.2.   During nurse's follow-up conversation, patient stated that she changed  to [facility]. Nurse explained to patient that [name] had [name] who call all of [name]'s patients who have [name]. Informed patient that a [name] Nurse would outreach to her to take over her care. Patient shared that she was doing well and did not have any concerns or acute needs. She verbalize understanding that a new nurse would take over her care.   Plan: Referred Patient to [name].  Emelia Loron RN, [place] [phone] [name]

## 2020-12-15 NOTE — Patient Instructions (Signed)
Goals Addressed            This Visit's Progress   . Monitor and Manage My Blood Sugar-Diabetes Type 2       Timeframe:  Long-Range Goal Priority:  High Start Date: 12/15/20                           Expected End Date: 05/25/21                      Follow Up Date 03/25/21    - check blood sugar at prescribed times - check blood sugar if I feel it is too high or too low - enter blood sugar readings and medication or insulin into daily log - take the blood sugar log to all doctor visits    Why is this important?    Checking your blood sugar at home helps to keep it from getting very high or very low.   Writing the results in a diary or log helps the doctor know how to care for you.   Your blood sugar log should have the time, date and the results.   Also, write down the amount of insulin or other medicine that you take.   Other information, like what you ate, exercise done and how you were feeling, will also be helpful.     Notes: Patient states she is taking her blood sugar 1-2 times daily and she does record the values.     . Patient will report reduction in A1c by 0.5-1 points within the next 90 days   Not on track    [name] (see longtitudinal plan of care for additional care plan information)  Objective:  Lab Results  Component Value Date   HGBA1C 8.2 (H) 03/30/2020 .   Lab Results  Component Value Date   CREATININE 0.96 03/30/2020   CREATININE 0.97 06/29/2019   CREATININE 0.86 06/28/2019 .   Marland Kitchen No results found for: EGFR  Current Barriers:  Marland Kitchen Knowledge Deficits related to basic Diabetes pathophysiology and self care/management  Case Manager Clinical Goal(s):  Over the next 90 days, patient will demonstrate improved adherence to prescribed treatment plan for diabetes self care/management as evidenced by:  Marland Kitchen Verbalize daily monitoring and recording of CBG within 90 days . Verbalize adherence to ADA/ carb modified diet within the next 90 days . Verbalize  exercise 2-3 days/week RN encouraged patient to continue with daily FBS monitoring, increase physical activity as tolerated, and to monitor sugar and carbohydrates closely. RN discussed decreasing portion sizes and other weight loss tips with patient.  Interventions:  . Patient Self Care Activities:  . Self administers oral medications as prescribed . Attends all scheduled provider appointments . Checks blood sugars as prescribed and utilize hyper and hypoglycemia protocol as needed . Patient states that she has not done very well sticking to her ADA diet. She has not been motivated. Patient states she has all of the diabetic education she needs, "I just need to eat right and do better".   Please see past updates related to this goal by clicking on the "Past Updates" button in the selected goal  Updated: 12/15/20 Timeframe:  Long-Range Goal Priority:  High Start Date:  06/06/20                           Expected End Date: 05/25/21 Follow Up Date: 03/25/21                         .  COMPLETED: Set My Target A1C-Diabetes Type 2       Timeframe:  Short-Term Goal Priority:  Low Start Date:   12/15/20                          Expected End Date: 12/15/20                      Follow Up Date 12/15/20    - set target A1C    Why is this important?    Your target A1C is decided together by you and your doctor.   It is based on several things like your age and other health issues.    Notes: Patient set a goal of 6.5 with her provider

## 2020-12-15 NOTE — Telephone Encounter (Signed)
This encounter was created in error - please disregard.

## 2020-12-19 ENCOUNTER — Other Ambulatory Visit: Payer: Self-pay

## 2020-12-19 NOTE — Patient Outreach (Signed)
[place] [facility]) Care Management  12/19/2020  [name] 1946/05/08 [id number]   Telephone call to patient for introduction after transfer from Emelia Loron, [place]. Patient states she is aware that CM would be calling.  She admits to not following a diabetic diet but will get back on track.  CM advised patient that CM would follow up with her in March.  She is agreeable and states she will be on track.    Plan: RN CM will follow up with patient in March and patient agreeable.  Patient agrees to care plan and follow up.   Jone Baseman, RN, MSN [place] Management Care Management Coordinator Direct Line [phone] Cell [phone] Toll Free: [phone]  Fax: [phone]

## 2021-02-13 ENCOUNTER — Other Ambulatory Visit: Payer: Self-pay

## 2021-02-13 NOTE — Patient Outreach (Signed)
[place] [facility]) Care Management  02/13/2021  [name] 03/20/1946 [id number]   Telephone call to patient for disease management follow up. No answer. Unable to leave a message.  Plan: RN CM will attempt patient again in the month of June.  Jone Baseman, RN, MSN [place] Management Care Management Coordinator Direct Line [phone] Cell [phone] Toll Free: [phone]  Fax: [phone]

## 2021-02-14 ENCOUNTER — Ambulatory Visit: Payer: Self-pay

## 2021-03-22 ENCOUNTER — Other Ambulatory Visit: Payer: Self-pay | Admitting: Family Medicine

## 2021-03-27 ENCOUNTER — Other Ambulatory Visit: Payer: Self-pay | Admitting: Family Medicine

## 2021-04-21 ENCOUNTER — Telehealth: Payer: Self-pay | Admitting: Family Medicine

## 2021-04-21 NOTE — Telephone Encounter (Signed)
Year supply was sent to [facility] in Dec of 2021

## 2021-04-26 MED ORDER — LISINOPRIL 10 MG PO TABS: 2.0000 | ORAL_TABLET | Freq: Every day | ORAL | 3 refills | Status: DC

## 2021-04-26 NOTE — Telephone Encounter (Signed)
Rx sent into [facility].

## 2021-04-26 NOTE — Telephone Encounter (Signed)
Patient called back and wanted to see if Lisinopril can be transferred to     [facility] [place], [place] [address]  Phone:  [phone] Fax:  [phone]  CB is [phone]

## 2021-04-26 NOTE — Addendum Note (Signed)
Addended by: Nathanial Millman E on: 04/26/2021 04:06 PM   Modules accepted: Orders

## 2021-05-16 ENCOUNTER — Other Ambulatory Visit: Payer: Self-pay

## 2021-05-16 NOTE — Patient Instructions (Signed)
Goals Addressed             This Visit's Progress    Monitor and Manage My Blood Sugar-Diabetes Type 2   On track    Barriers: Health Behaviors Knowledge Timeframe:  Long-Range Goal Priority:  High Start Date: 12/15/20                           Expected End Date: 11/25/21              Follow Up Date 08/25/21 - check blood sugar at prescribed times - check blood sugar if I feel it is too high or too low - enter blood sugar readings and medication or insulin into daily log     Why is this important?   Checking your blood sugar at home helps to keep it from getting very high or very low.  Writing the results in a diary or log helps the doctor know how to care for you.  Your blood sugar log should have the time, date and the results.  Also, write down the amount of insulin or other medicine that you take.  Other information, like what you ate, exercise done and how you were feeling, will also be helpful.     Notes: Patient states she is taking her blood sugar 1-2 times daily and she does record the values.  05/16/21 Patient reports blood sugars have been in the 200's some but less than 300. Admits to not following diet at times.  Encouraged to limit sweets and carbohydrates.   Diabetes Management Discussed: Medication adherence Reviewed importance of limiting carbs such as rice, potatoes, breads, and pastas. Also discussed limiting sweets and sugary drinks.  Discussed importance of portion control.  Also discussed importance of annual exams, foot exams, and eye exams.

## 2021-05-16 NOTE — Patient Outreach (Signed)
[place] [facility]) Care Management  [place]  05/16/2021   [name] October 13, 1946 [id number]  Subjective: Telephone call to patient for disease management support.  She reports she is doing good. She reports that her sugars have been in 200 range but not over 300.  Patient admits to not following her diet.  Discussed diabetes management.  Patient now back to [facility]  Advised t hat CM would be transferring to health coach for disease management.  She verbalized understanding.  Objective:   Encounter Medications:  Outpatient Encounter Medications as of 05/16/2021  Medication Sig   acetaminophen (TYLENOL) 325 MG tablet Take 650 mg by mouth every 6 (six) hours as needed for headache (pain).   Alcohol Swabs (ALCOHOL PREP) PADS Use to test 1 X day and diagnosis code is E 11.9   amLODipine (NORVASC) 5 MG tablet Take 1 tablet by mouth once daily   aspirin 81 MG chewable tablet Chew 1 tablet (81 mg total) by mouth daily.   diclofenac (VOLTAREN) 75 MG EC tablet Take 1 tablet by mouth twice daily   furosemide (LASIX) 20 MG tablet Take 1 tablet by mouth once daily   glipiZIDE (GLUCOTROL) 5 MG tablet Take 1 tablet (5 mg total) by mouth daily before breakfast.   glucose blood (ONETOUCH VERIO) test strip USE 1 STRIP TO CHECK GLUCOSE ONCE DAILY   Lancets (ONETOUCH ULTRASOFT) lancets USE   TO CHECK GLUCOSE ONCE DAILY   lisinopril (ZESTRIL) 10 MG tablet Take 2 tablets (20 mg total) by mouth daily.   metFORMIN (GLUCOPHAGE) 500 MG tablet Take 1 tablet (500 mg total) by mouth 2 (two) times daily with a meal.   metoprolol succinate (TOPROL-XL) 50 MG 24 hr tablet TAKE 1 TABLET BY MOUTH ONCE DAILY WITH OR  IMMEDIATELY  FOLLOWING  A  MEAL   rosuvastatin (CRESTOR) 40 MG tablet Take one tablet by mouth daily   spironolactone (ALDACTONE) 25 MG tablet Take 1 tablet by mouth once daily   No facility-administered encounter medications on file as of 05/16/2021.    Functional  Status:  In your present state of health, do you have any difficulty performing the following activities: 05/16/2021 06/08/2020  Hearing? N N  Vision? N Y  Comment - Has a cataract in left eye is scheduled next Thursday  Difficulty concentrating or making decisions? Y Y  Comment short term memory loss at times Has issues at times with short term memory  Walking or climbing stairs? N N  Dressing or bathing? N N  Doing errands, shopping? N N  Preparing Food and eating ? N N  Using the Toilet? N N  In the past six months, have you accidently leaked urine? N N  Do you have problems with loss of bowel control? N Y  Comment - States has some issues controlling bowels since taking metformin  Managing your Medications? N N  Managing your Finances? N N  Housekeeping or managing your Housekeeping? N N  Some recent data might be hidden    Fall/Depression Screening: Fall Risk  05/16/2021 12/15/2020 08/08/2020  Falls in the past year? 0 1 1  Comment - - -  Number falls in past yr: - 0 0  Injury with Fall? - 0 0  Risk for fall due to : - Impaired mobility;Impaired balance/gait;History of fall(s) Impaired balance/gait;Impaired mobility  Follow up - Falls prevention discussed;Education provided;Falls evaluation completed Falls prevention discussed;Falls evaluation completed;Education provided   [facility] 2/9 Scores 05/16/2021 06/08/2020  03/24/2020 01/23/2019 01/15/2018 02/14/2017 11/28/2016  PHQ - 2 Score 0 0 0 0 0 0 0  PHQ- 9 Score - 0 - 1 - - -    Assessment:   Care Plan Care Plan : Diabetes Type 2 (Adult)  Updates made by Jon Billings, RN since 05/16/2021 12:00 AM     Problem: Disease Progression (Diabetes, Type 2)   Priority: High     Long-Range Goal: Disease Progression Prevented or Minimized Completed 05/16/2021  Start Date: 12/15/2020  Expected End Date: 05/25/2021  Note:   Evidence-based guidance:  Prepare patient for laboratory and diagnostic exams based on risk and presentation.  Encourage  lifestyle changes, such as increased intake of plant-based foods, stress reduction, consistent physical activity and smoking cessation to prevent long-term complications and chronic disease.   Individualize activity and exercise recommendations while considering potential limitations, such as neuropathy, retinopathy or the ability to prevent hyperglycemia or hypoglycemia.   Prepare patient for use of pharmacologic therapy that may include antihypertensive, analgesic, prostaglandin E1 with periodic adjustments, based on presenting chronic condition and laboratory results.  Assess signs/symptoms and risk factors for hypertension, sleep-disordered breathing, neuropathy (including changes in gait and balance), retinopathy, nephropathy and sexual dysfunction.  Address pregnancy planning and contraceptive choice, especially when prescribing antihypertensive or statin.  Ensure completion of annual comprehensive foot exam and dilated eye exam.   Implement additional individualized goals and interventions based on identified risk factors.  Prepare patient for consultation or referral for specialist care, such as ophthalmology, neurology, cardiology, podiatry, nephrology or perinatology.   Notes:     Task: Monitor and Manage Follow-up for Comorbidities Completed 05/16/2021  Due Date: 05/25/2021  Note:   Care Management Activities:    - completion of annual dilated eye exam confirmed - completion of annual foot exam verified - healthy lifestyle promoted - modest weight loss (5 percent) promoted - reduction of sedentary activity encouraged - vital signs and trends reviewed    Notes:       Goals Addressed             This Visit's Progress    Monitor and Manage My Blood Sugar-Diabetes Type 2   On track    Barriers: Health Behaviors Knowledge Timeframe:  Long-Range Goal Priority:  High Start Date: 12/15/20                           Expected End Date: 11/25/21              Follow Up Date  08/25/21 - check blood sugar at prescribed times - check blood sugar if I feel it is too high or too low - enter blood sugar readings and medication or insulin into daily log     Why is this important?   Checking your blood sugar at home helps to keep it from getting very high or very low.  Writing the results in a diary or log helps the doctor know how to care for you.  Your blood sugar log should have the time, date and the results.  Also, write down the amount of insulin or other medicine that you take.  Other information, like what you ate, exercise done and how you were feeling, will also be helpful.     Notes: Patient states she is taking her blood sugar 1-2 times daily and she does record the values.  05/16/21 Patient reports blood sugars have been in the 200's some but less than  300. Admits to not following diet at times.  Encouraged to limit sweets and carbohydrates.   Diabetes Management Discussed: Medication adherence Reviewed importance of limiting carbs such as rice, potatoes, breads, and pastas. Also discussed limiting sweets and sugary drinks.  Discussed importance of portion control.  Also discussed importance of annual exams, foot exams, and eye exams.            Plan:  Follow-up: Patient agrees to Care Plan and Follow-up. Patient to be transferred to health coach for continued disease management support.   Health Coach will follow up as scheduled.   Jone Baseman, RN, MSN [place] Management Care Management Coordinator Direct Line [phone] Cell [phone] Toll Free: [phone]  Fax: [phone]

## 2021-06-06 DIAGNOSIS — E119 Type 2 diabetes mellitus without complications: Secondary | ICD-10-CM | POA: Diagnosis not present

## 2021-06-12 ENCOUNTER — Ambulatory Visit: Payer: Medicare Other

## 2021-06-15 ENCOUNTER — Other Ambulatory Visit: Payer: Self-pay | Admitting: Family Medicine

## 2021-06-16 ENCOUNTER — Ambulatory Visit: Payer: Medicare Other

## 2021-06-21 ENCOUNTER — Ambulatory Visit: Payer: Medicare Other

## 2021-06-23 ENCOUNTER — Ambulatory Visit (INDEPENDENT_AMBULATORY_CARE_PROVIDER_SITE_OTHER): Payer: Medicare Other

## 2021-06-23 DIAGNOSIS — Z0000 Encounter for general adult medical examination without abnormal findings: Secondary | ICD-10-CM

## 2021-06-23 DIAGNOSIS — Z780 Asymptomatic menopausal state: Secondary | ICD-10-CM

## 2021-06-23 NOTE — Patient Instructions (Signed)
[name] , Thank you for taking time to come for your Medicare Wellness Visit. I appreciate your ongoing commitment to your health goals. Please review the following plan we discussed and let me know if I can assist you in the future.   Screening recommendations/referrals: Colonoscopy: 07/26/2020 Mammogram: 10/06/2020 Bone Density: referral completed 06/23/2021 Recommended yearly ophthalmology/optometry visit for glaucoma screening and checkup Recommended yearly dental visit for hygiene and checkup  Vaccinations: Influenza vaccine: due in fall 2022  Pneumococcal vaccine: due 2nd dose  Tdap vaccine: 07/16/2017 Shingles vaccine: will obtain local pharmacy     Advanced directives: will provide copies   Conditions/risks identified: none   Next appointment: none    Preventive Care 74 Years and Older, Female Preventive care refers to lifestyle choices and visits with your health care provider that can promote health and wellness. What does preventive care include? A yearly physical exam. This is also called an annual well check. Dental exams once or twice a year. Routine eye exams. Ask your health care provider how often you should have your eyes checked. Personal lifestyle choices, including: Daily care of your teeth and gums. Regular physical activity. Eating a healthy diet. Avoiding tobacco and drug use. Limiting alcohol use. Practicing safe sex. Taking low-dose aspirin every day. Taking vitamin and mineral supplements as recommended by your health care provider. What happens during an annual well check? The services and screenings done by your health care provider during your annual well check will depend on your age, overall health, lifestyle risk factors, and family history of disease. Counseling  Your health care provider may ask you questions about your: Alcohol use. Tobacco use. Drug use. Emotional well-being. Home and relationship well-being. Sexual  activity. Eating habits. History of falls. Memory and ability to understand (cognition). Work and work Statistician. Reproductive health. Screening  You may have the following tests or measurements: Height, weight, and BMI. Blood pressure. Lipid and cholesterol levels. These may be checked every 5 years, or more frequently if you are over 54 years old. Skin check. Lung cancer screening. You may have this screening every year starting at age 73 if you have a 30-pack-year history of smoking and currently smoke or have quit within the past 15 years. Fecal occult blood test (FOBT) of the stool. You may have this test every year starting at age 50. Flexible sigmoidoscopy or colonoscopy. You may have a sigmoidoscopy every 5 years or a colonoscopy every 10 years starting at age 67. Hepatitis C blood test. Hepatitis B blood test. Sexually transmitted disease (STD) testing. Diabetes screening. This is done by checking your blood sugar (glucose) after you have not eaten for a while (fasting). You may have this done every 1-3 years. Bone density scan. This is done to screen for osteoporosis. You may have this done starting at age 87. Mammogram. This may be done every 1-2 years. Talk to your health care provider about how often you should have regular mammograms. Talk with your health care provider about your test results, treatment options, and if necessary, the need for more tests. Vaccines  Your health care provider may recommend certain vaccines, such as: Influenza vaccine. This is recommended every year. Tetanus, diphtheria, and acellular pertussis (Tdap, Td) vaccine. You may need a Td booster every 10 years. Zoster vaccine. You may need this after age 79. Pneumococcal 13-valent conjugate (PCV13) vaccine. One dose is recommended after age 11. Pneumococcal polysaccharide (PPSV23) vaccine. One dose is recommended after age 31. Talk to your health care  provider about which screenings and vaccines  you need and how often you need them. This information is not intended to replace advice given to you by your health care provider. Make sure you discuss any questions you have with your health care provider. Document Released: 12/09/2015 Document Revised: 08/01/2016 Document Reviewed: 09/13/2015 Elsevier Interactive Patient Education  2017 [employer] Prevention in the Home Falls can cause injuries. They can happen to people of all ages. There are many things you can do to make your home safe and to help prevent falls. What can I do on the outside of my home? Regularly fix the edges of walkways and driveways and fix any cracks. Remove anything that might make you trip as you walk through a door, such as a raised step or threshold. Trim any bushes or trees on the path to your home. Use bright outdoor lighting. Clear any walking paths of anything that might make someone trip, such as rocks or tools. Regularly check to see if handrails are loose or broken. Make sure that both sides of any steps have handrails. Any raised decks and porches should have guardrails on the edges. Have any leaves, snow, or ice cleared regularly. Use sand or salt on walking paths during winter. Clean up any spills in your garage right away. This includes oil or grease spills. What can I do in the bathroom? Use night lights. Install grab bars by the toilet and in the tub and shower. Do not use towel bars as grab bars. Use non-skid mats or decals in the tub or shower. If you need to sit down in the shower, use a plastic, non-slip stool. Keep the floor dry. Clean up any water that spills on the floor as soon as it happens. Remove soap buildup in the tub or shower regularly. Attach bath mats securely with double-sided non-slip rug tape. Do not have throw rugs and other things on the floor that can make you trip. What can I do in the bedroom? Use night lights. Make sure that you have a light by your bed that  is easy to reach. Do not use any sheets or blankets that are too big for your bed. They should not hang down onto the floor. Have a firm chair that has side arms. You can use this for support while you get dressed. Do not have throw rugs and other things on the floor that can make you trip. What can I do in the kitchen? Clean up any spills right away. Avoid walking on wet floors. Keep items that you use a lot in easy-to-reach places. If you need to reach something above you, use a strong step stool that has a grab bar. Keep electrical cords out of the way. Do not use floor polish or wax that makes floors slippery. If you must use wax, use non-skid floor wax. Do not have throw rugs and other things on the floor that can make you trip. What can I do with my stairs? Do not leave any items on the stairs. Make sure that there are handrails on both sides of the stairs and use them. Fix handrails that are broken or loose. Make sure that handrails are as long as the stairways. Check any carpeting to make sure that it is firmly attached to the stairs. Fix any carpet that is loose or worn. Avoid having throw rugs at the top or bottom of the stairs. If you do have throw rugs, attach them to the  floor with carpet tape. Make sure that you have a light switch at the top of the stairs and the bottom of the stairs. If you do not have them, ask someone to add them for you. What else can I do to help prevent falls? Wear shoes that: Do not have high heels. Have rubber bottoms. Are comfortable and fit you well. Are closed at the toe. Do not wear sandals. If you use a stepladder: Make sure that it is fully opened. Do not climb a closed stepladder. Make sure that both sides of the stepladder are locked into place. Ask someone to hold it for you, if possible. Clearly mark and make sure that you can see: Any grab bars or handrails. First and last steps. Where the edge of each step is. Use tools that help you  move around (mobility aids) if they are needed. These include: Canes. Walkers. Scooters. Crutches. Turn on the lights when you go into a dark area. Replace any light bulbs as soon as they burn out. Set up your furniture so you have a clear path. Avoid moving your furniture around. If any of your floors are uneven, fix them. If there are any pets around you, be aware of where they are. Review your medicines with your doctor. Some medicines can make you feel dizzy. This can increase your chance of falling. Ask your doctor what other things that you can do to help prevent falls. This information is not intended to replace advice given to you by your health care provider. Make sure you discuss any questions you have with your health care provider. Document Released: 09/08/2009 Document Revised: 04/19/2016 Document Reviewed: 12/17/2014 Elsevier Interactive Patient Education  2017 [employer].

## 2021-06-23 NOTE — Progress Notes (Signed)
Subjective:   [name] is a 75 y.o. female who presents for Medicare Annual (Subsequent) preventive examination.   I connected with [name] today by telephone and verified that I am speaking with the correct person using two identifiers. Location patient: home Location provider: work Persons participating in the virtual visit: patient, provider.   I discussed the limitations, risks, security and privacy concerns of performing an evaluation and management service by telephone and the availability of in person appointments. I also discussed with the patient that there may be a patient responsible charge related to this service. The patient expressed understanding and verbally consented to this telephonic visit.    Interactive audio and video telecommunications were attempted between this provider and patient, however failed, due to patient having technical difficulties OR patient did not have access to video capability.  We continued and completed visit with audio only.    Review of Systems    N/a       Objective:    There were no vitals filed for this visit. There is no height or weight on file to calculate BMI.  Advanced Directives 05/16/2021 06/08/2020 04/28/2020 03/24/2020 06/27/2019 01/23/2019 01/15/2018  Does Patient Have a Medical Advance Directive? No Yes No No No No No  Type of Advance Directive - Healthcare Power of [place];Living will - - - - -  Does patient want to make changes to medical advance directive? - No - Patient declined - - - - -  Copy of [place] in Chart? - No - copy requested - - - - -  Would patient like information on creating a medical advance directive? No - Patient declined - No - Patient declined Yes (ED - Information included in AVS) No - Patient declined Yes (MAU/Ambulatory/Procedural Areas - Information given) -    Current Medications (verified) Outpatient Encounter Medications as of 06/23/2021  Medication Sig    acetaminophen (TYLENOL) 325 MG tablet Take 650 mg by mouth every 6 (six) hours as needed for headache (pain).   Alcohol Swabs (ALCOHOL PREP) PADS Use to test 1 X day and diagnosis code is E 11.9   amLODipine (NORVASC) 5 MG tablet Take 1 tablet by mouth once daily   aspirin 81 MG chewable tablet Chew 1 tablet (81 mg total) by mouth daily.   diclofenac (VOLTAREN) 75 MG EC tablet Take 1 tablet by mouth twice daily   furosemide (LASIX) 20 MG tablet Take 1 tablet by mouth once daily   glipiZIDE (GLUCOTROL) 5 MG tablet TAKE 1 TABLET BY MOUTH ONCE DAILY BEFORE BREAKFAST   glucose blood (ONETOUCH VERIO) test strip USE 1 STRIP TO CHECK GLUCOSE ONCE DAILY   Lancets (ONETOUCH ULTRASOFT) lancets USE   TO CHECK GLUCOSE ONCE DAILY   lisinopril (ZESTRIL) 10 MG tablet Take 2 tablets (20 mg total) by mouth daily.   metFORMIN (GLUCOPHAGE) 500 MG tablet Take 1 tablet (500 mg total) by mouth 2 (two) times daily with a meal.   metoprolol succinate (TOPROL-XL) 50 MG 24 hr tablet TAKE 1 TABLET BY MOUTH ONCE DAILY WITH OR  IMMEDIATELY  FOLLOWING  A  MEAL   rosuvastatin (CRESTOR) 40 MG tablet Take one tablet by mouth daily   spironolactone (ALDACTONE) 25 MG tablet Take 1 tablet by mouth once daily   No facility-administered encounter medications on file as of 06/23/2021.    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    Hx raising of her deceased sister's 3 children, and is now raising/supporting 10yo  'great-grandson'.    Attends church, church community as support system. Many of her family has passed away.   Enjoys watching TV; used to enjoy going fishing         Social Determinants of Radio broadcast assistant Strain: Not on file  Food Insecurity: Not on file  Transportation Needs: No Transportation Needs   Lack of Transportation (Medical): No   Lack of Transportation (Non-Medical): No  Physical Activity: Not on file  Stress: Not on file  Social Connections: Not on file    Tobacco Counseling Ready to quit: Not Answered Counseling given: Not Answered Tobacco comments: trying to quit;  not really motivated at present   Clinical Intake:                 Diabetic?yes Nutrition Risk Assessment:  Has the patient had any N/V/D within the last 2 months?  No  Does the patient have any  non-healing wounds?  No  Has the patient had any unintentional weight loss or weight gain?  No   Diabetes:  Is the patient diabetic?  Yes  If diabetic, was a CBG obtained today?  No  Did the patient bring in their glucometer from home?  No  How often do you monitor your CBG's?  2 x day  Financial Strains and Diabetes Management:  Are you having any financial strains with the device, your supplies or your medication? No .  Does the patient want to be seen by Chronic Care Management for management of their diabetes?  No  Would the patient like to be referred to a Nutritionist or for Diabetic Management?  No   Diabetic Exams:  Diabetic Eye Exam: Completed 02/20222 Diabetic Foot Exam: Overdue, Pt has been advised about the importance in completing this exam. Pt is scheduled for diabetic foot exam on next office visit .          Activities of Daily Living In your present state of health, do you have any difficulty performing the following activities: 05/16/2021  Hearing? N  Vision? N  Difficulty concentrating or making decisions? Y  Comment short term memory loss at times  Walking or climbing stairs? N  Dressing or bathing? N  Doing errands, shopping? N  Preparing Food and eating ? N  Using the Toilet? N  In the past six months, have you accidently leaked urine? N  Do you have problems with loss of bowel control? N  Managing your Medications? N  Managing your Finances? N  Housekeeping or managing your Housekeeping? N  Some recent data might be hidden    Patient Care Team: Laurey Morale, MD as PCP - General [facility] Wine, Argie Ramming, RN as [place] any recent Medical Services you may have received from other than [facility] providers in the past year (date may be approximate).     Assessment:   This is a routine wellness examination for [place].  Hearing/Vision screen No results found.  Dietary issues and  exercise activities discussed:     Goals Addressed   None    Depression Screen PHQ 2/9 Scores 05/16/2021 06/08/2020 03/24/2020 01/23/2019 01/15/2018 02/14/2017 11/28/2016  PHQ - 2 Score 0 0 0 0 0 0 0  PHQ- 9 Score - 0 - 1 - - -    Fall Risk Fall Risk  05/16/2021 12/15/2020 08/08/2020 06/08/2020 06/06/2020  Falls in the past year? 0 1 1 1 1   Comment - - - - -  Number falls in past yr: - 0 0 0 0  Injury with Fall? - 0 0 0 0  Risk for fall due to : - Impaired mobility;Impaired balance/gait;History of fall(s) Impaired balance/gait;Impaired mobility Orthopedic patient Impaired balance/gait;Impaired mobility  Follow up - Falls prevention discussed;Education provided;Falls evaluation completed Falls prevention discussed;Falls evaluation completed;Education provided Falls evaluation completed;Falls prevention discussed Falls prevention discussed;Education provided;Falls evaluation completed    FALL RISK PREVENTION PERTAINING TO THE HOME:  Any stairs in or around the home? No  If so, are there any without handrails? No  Home free of loose throw rugs in walkways, pet beds, electrical cords, etc? Yes  Adequate lighting in your home to reduce risk of falls? Yes   ASSISTIVE DEVICES UTILIZED TO PREVENT FALLS:  Life alert? Yes  Use of a cane, walker or w/c? Yes  Grab bars in the bathroom? No  Shower chair or bench in shower? Yes  Elevated toilet seat or a handicapped toilet? No    Cognitive Function:  Normal cognitive status assessed by direct observation by this Nurse Health Advisor. No abnormalities found.     6CIT Screen 06/08/2020  What Year? 0 points  What month? 0 points  What time? 0 points  Count back from 20 0 points  Months in reverse 0 points  Repeat phrase 0 points  Total Score 0    Immunizations Immunization History  Administered Date(s) Administered  Fluad Quad(high Dose 65+) 08/18/2019   Influenza, High Dose Seasonal PF 10/17/2015, 11/28/2016, 12/24/2017, 01/23/2019    Influenza, Seasonal, Injecte, Preservative Fre 12/01/2012   Influenza,inj,Quad PF,6+ Mos 09/07/2013, 10/11/2014   PFIZER(Purple Top)SARS-COV-2 Vaccination 01/28/2020, 02/24/2020   Pneumococcal Conjugate-13 08/18/2019   Td 12/22/2007   Tdap 07/16/2017    TDAP status: Up to date  Flu Vaccine status: Up to date  Pneumococcal vaccine status: Up to date  Covid-19 vaccine status: Completed vaccines  Qualifies for Shingles Vaccine? Yes   Zostavax completed No   Shingrix Completed?: No.    Education has been provided regarding the importance of this vaccine. Patient has been advised to call insurance company to determine out of pocket expense if they have not yet received this vaccine. Advised may also receive vaccine at local pharmacy or Health Dept. Verbalized acceptance and understanding.  Screening Tests Health Maintenance  Topic Date Due   Fecal DNA (Cologuard)  Never done   Zoster Vaccines- Shingrix (1 of 2) Never done   DEXA SCAN  Never done   FOOT EXAM  12/27/2018   OPHTHALMOLOGY EXAM  12/09/2019   COVID-19 Vaccine (3 - Booster for Pfizer series) 07/26/2020   PNA vac Low Risk Adult (2 of 2 - PPSV23) 08/17/2020   HEMOGLOBIN A1C  09/30/2020   INFLUENZA VACCINE  06/26/2021   MAMMOGRAM  10/06/2022   TETANUS/TDAP  07/17/2027   Hepatitis C Screening  Completed   HPV VACCINES  Aged Out    Health Maintenance  Health Maintenance Due  Topic Date Due   Fecal DNA (Cologuard)  Never done   Zoster Vaccines- Shingrix (1 of 2) Never done   DEXA SCAN  Never done   FOOT EXAM  12/27/2018   OPHTHALMOLOGY EXAM  12/09/2019   COVID-19 Vaccine (3 - Booster for Pfizer series) 07/26/2020   PNA vac Low Risk Adult (2 of 2 - PPSV23) 08/17/2020   HEMOGLOBIN A1C  09/30/2020    Colorectal cancer screening: Type of screening: Colonoscopy. Completed 07/26/2020. Repeat every 10 years  Mammogram status: Completed 10/06/2020. Repeat every year  Bone Density status: Ordered 06/23/2021. Pt provided  with contact info and advised to call to schedule appt.  Lung Cancer Screening: (Low Dose CT Chest recommended if Age 81-80 years, 30 pack-year currently smoking OR have quit w/in 15years.) does not qualify.   Lung Cancer Screening Referral: n/a  Additional Screening:  Hepatitis C Screening: does not qualify; Completed 11/26/2016  Vision Screening: Recommended annual ophthalmology exams for early detection of glaucoma and other disorders of the eye. Is the patient up to date with their annual eye exam?  Yes  Who is the provider or what is the name of the office in which the patient attends annual eye exams? Dr.Grote  If pt is not established with a provider, would they like to be referred to a provider to establish care? No .   Dental Screening: Recommended annual dental exams for proper oral hygiene  Community Resource Referral / Chronic Care Management: CRR required this visit?  No   CCM required this visit?  No      Plan:     I have personally reviewed and noted the following in the patient's chart:   Medical and social history Use of alcohol, tobacco or illicit drugs  Current medications and supplements including opioid prescriptions.  Functional ability and status Nutritional status Physical activity Advanced directives List of other physicians Hospitalizations, surgeries, and ER visits in previous 12 months Vitals Screenings to include  cognitive, depression, and falls Referrals and appointments  In addition, I have reviewed and discussed with patient certain preventive protocols, quality metrics, and best practice recommendations. A written personalized care plan for preventive services as well as general preventive health recommendations were provided to patient.     Randel Pigg, LPN   05/23/6380   Nurse Notes: none

## 2021-07-12 ENCOUNTER — Other Ambulatory Visit: Payer: Self-pay | Admitting: Family Medicine

## 2021-07-13 ENCOUNTER — Other Ambulatory Visit: Payer: Self-pay

## 2021-07-13 MED ORDER — METFORMIN HCL 500 MG PO TABS: 500.0000 mg | ORAL_TABLET | Freq: Two times a day (BID) | ORAL | 0 refills | Status: DC

## 2021-07-21 ENCOUNTER — Ambulatory Visit (INDEPENDENT_AMBULATORY_CARE_PROVIDER_SITE_OTHER): Payer: Medicare Other | Admitting: Family Medicine

## 2021-07-21 ENCOUNTER — Encounter: Payer: Self-pay | Admitting: Family Medicine

## 2021-07-21 ENCOUNTER — Other Ambulatory Visit: Payer: Self-pay

## 2021-07-21 VITALS — BP 142/86 | HR 61 | Temp 98.0°F

## 2021-07-21 DIAGNOSIS — E1140 Type 2 diabetes mellitus with diabetic neuropathy, unspecified: Secondary | ICD-10-CM

## 2021-07-21 LAB — POCT GLYCOSYLATED HEMOGLOBIN (HGB A1C): Hemoglobin A1C: 9.6 % — AB (ref 4.0–5.6)

## 2021-07-21 MED ORDER — GLIPIZIDE 10 MG PO TABS: 10.0000 mg | ORAL_TABLET | Freq: Two times a day (BID) | ORAL | 3 refills | Status: DC

## 2021-07-21 MED ORDER — SITAGLIPTIN PHOSPHATE 100 MG PO TABS: 100.0000 mg | ORAL_TABLET | Freq: Every day | ORAL | 3 refills | Status: DC

## 2021-07-21 NOTE — Progress Notes (Signed)
   Subjective:    Patient ID: [name], female    DOB: [birth year], 75 y.o.   MRN: [id number]  HPI Here to follow up on diabetes. Her A1c continues to climb over the past few years. In November 2020 it was 6.1, then in May of 2021 it was 8.2. Today it is up to 9.6. She admits to frequent splurging on her diet. She feels well overall.    Review of Systems  Constitutional: Negative.   Respiratory: Negative.    Cardiovascular: Negative.       Objective:   Physical Exam Constitutional:      Appearance: Normal appearance. She is obese.  Cardiovascular:     Rate and Rhythm: Normal rate and regular rhythm.     Pulses: Normal pulses.     Heart sounds: Normal heart sounds.  Pulmonary:     Effort: Pulmonary effort is normal.     Breath sounds: Normal breath sounds.  Neurological:     Mental Status: She is alert.          Assessment & Plan:  Type 2 diabetes, poorly controlled. We will increase the Glipizide to 10 mg BID and we will add Januvia 100 mg daily. She agreed to be more diligent with her diet. Recheck in 3 months.  Alysia Penna, MD

## 2021-07-24 ENCOUNTER — Telehealth: Payer: Self-pay | Admitting: Family Medicine

## 2021-07-24 NOTE — Telephone Encounter (Signed)
Patient brought in paperwork that needs to be completed. Paperwork will be placed in the folder.  Patient can be contacted at [phone] when paperwork is complete.  Please advise.

## 2021-07-25 NOTE — Telephone Encounter (Signed)
Spoke with pt aware that her Placard is complete and ready for pick up at the office, pt state that she will pick up tomorrow. Form placed in the front office cabinet

## 2021-08-15 ENCOUNTER — Telehealth: Payer: Self-pay

## 2021-08-15 ENCOUNTER — Other Ambulatory Visit: Payer: Self-pay

## 2021-08-15 ENCOUNTER — Other Ambulatory Visit: Payer: Self-pay | Admitting: *Deleted

## 2021-08-15 ENCOUNTER — Encounter: Payer: Self-pay | Admitting: *Deleted

## 2021-08-15 DIAGNOSIS — E1140 Type 2 diabetes mellitus with diabetic neuropathy, unspecified: Secondary | ICD-10-CM

## 2021-08-15 MED ORDER — ONETOUCH VERIO W/DEVICE KIT
PACK | 0 refills | Status: AC
Start: 2021-08-15 — End: ?

## 2021-08-15 NOTE — Telephone Encounter (Signed)
[facility] called the office regarding pt state that they have been working with pt to help control her Diabetes, requested for a meter be sent to her pharmacy, New Rx for a meter was sent to her pharmacy

## 2021-08-15 NOTE — Patient Instructions (Signed)
Goals Addressed             This Visit's Progress    ([name]) Monitor and Manage My Blood Sugar-Diabetes Type 2       Barriers: Health Behaviors Knowledge Timeframe:  Long-Range Goal Priority:  High Start Date: 12/15/20                           Expected End Date: 11/25/21              Follow Up Date 11/24/21 - check blood sugar at prescribed times - check blood sugar if I feel it is too high or too low - enter blood sugar readings and medication or insulin into daily log -Discussed limiting sugar and carbohydrates in patient's diet -Encouraged patient to not sit for long periods and to continue to get up often to do house-hold tasks   Why is this important?   Checking your blood sugar at home helps to keep it from getting very high or very low.  Writing the results in a diary or log helps the doctor know how to care for you.  Your blood sugar log should have the time, date and the results.  Also, write down the amount of insulin or other medicine that you take.  Other information, like what you ate, exercise done and how you were feeling, will also be helpful.     Notes: 08/15/21: Patient reports that her blood sugar meter is not working and states she has not taking her blood sugar in several weeks. Nurse called the patient's PCP's office and spoke to [place] who states she will get an order for a glucometer and send it to the patient's pharmacy for pickup. Patient's explains that her A1c has increased to 9.6. She wants to work towards lowering her A1c. Patient states she has begun to limit the amount of bread, rice, noodles, sweets and junk food that she was eating previously. Nurse will send patient a planning healthy meals booklet.  12/15/20: Patient states she is taking her blood sugar 1-2 times daily and she does record the values.  05/16/21 Patient reports blood sugars have been in the 200's some but less than 300. Admits to not following diet at times.  Encouraged to limit sweets  and carbohydrates.   Diabetes Management Discussed: Medication adherence Reviewed importance of limiting carbs such as rice, potatoes, breads, and pastas. Also discussed limiting sweets and sugary drinks.  Discussed importance of portion control.  Also discussed importance of annual exams, foot exams, and eye exams.        [facility]) Obtain Eye Exam-Diabetes Type 2       Timeframe:  Long-Range Goal Priority:  Medium Start Date:  08/15/21                           Expected End Date: 11/24/21                      Follow Up Date 11/24/21    - schedule appointment with eye doctor -Discussed her last eye exam was 07/18/21    Why is this important?   Eye check-ups are important when you have diabetes.  Vision loss can be prevented.    Notes: 08/15/21: Patient states she will call Dr. Zenia Resides office and schedule her annual eye exam.     [facility]) Set My Target A1C-Diabetes Type 2  Timeframe:  Short-Term Goal Priority:  Low Start Date:   12/15/20                          Expected End Date: 12/15/20                      Follow Up Date 12/15/20    - set target A1C    Why is this important?   Your target A1C is decided together by you and your doctor.  It is based on several things like your age and other health issues.    Notes: 08/15/21: Patient reports that her A1c has increased to 9.6. Patient states that she has begun to adhere to a diabetic diet and she will work towards lowering her A1c.  12/15/20: Patient set a goal of 6.5 with her provider     [facility]) Track and Manage My Blood Pressure-Hypertension       Timeframe:  Long-Range Goal Priority:  Medium Start Date:  08/15/21                           Expected End Date: 08/25/22                      Follow Up Date 11/24/21    - check blood pressure 3 times per week - write blood pressure results in a log or diary   -Encouraged patient to maintain a low sodium diet -Encouraged patient to not sit for long periods and to continue to get up  often to do house hold chores, dicussed not being sedentary -Discussed that uncontrolled high blood pressure can cause stroke, vision loss, and kidney disease  Why is this important?   You won't feel high blood pressure, but it can still hurt your blood vessels.  High blood pressure can cause heart or kidney problems. It can also cause a stroke.  Making lifestyle changes like losing a little weight or eating less salt will help.  Checking your blood pressure at home and at different times of the day can help to control blood pressure.  If the doctor prescribes medicine remember to take it the way the doctor ordered.  Call the office if you cannot afford the medicine or if there are questions about it.     Notes: 08/15/21: Patient states she will start to take her blood pressure and record the values at least 3 times a week.Nurse will send hypertension education and a calendar booklet for patient to record her B/P and blood sugar values.     COMPLETED: Exercise 150 minutes per week (moderate activity)       May consider going back to the y; riding the bike  Resolved due to duplicate goals 2/70/78     COMPLETED: Patient Stated       Go back to the gym; [name]  Will take exercise class, silver sneaker Ride the bike  Resolved due to duplicate goals 6/75/44       COMPLETED: Patient Stated       Return to [facility] doing low-impact exercise Go fishing with your great-grandson Resolved due to duplicate goals 08/15/09     COMPLETED: Patient will report reduction in A1c by 0.5-1 points within the next 90 days       [place] (see longtitudinal plan of care for additional care plan information)  Objective:  Lab Results  Component Value Date   HGBA1C 8.2 (H) 03/30/2020   Lab Results  Component Value Date   CREATININE 0.96 03/30/2020   CREATININE 0.97 06/29/2019   CREATININE 0.86 06/28/2019   No results found for: EGFR  Current Barriers:  Knowledge Deficits related to basic  Diabetes pathophysiology and self care/management  Case Manager Clinical Goal(s):  Over the next 90 days, patient will demonstrate improved adherence to prescribed treatment plan for diabetes self care/management as evidenced by:  Verbalize daily monitoring and recording of CBG within 90 days Verbalize adherence to ADA/ carb modified diet within the next 90 days Verbalize exercise 2-3 days/week RN encouraged patient to continue with daily FBS monitoring, increase physical activity as tolerated, and to monitor sugar and carbohydrates closely. RN discussed decreasing portion sizes and other weight loss tips with patient.  Interventions:  Patient Self Care Activities:  Self administers oral medications as prescribed Attends all scheduled provider appointments Checks blood sugars as prescribed and utilize hyper and hypoglycemia protocol as needed Patient states that she has not done very well sticking to her ADA diet. She has not been motivated. Patient states she has all of the diabetic education she needs, "I just need to eat right and do better".   Please see past updates related to this goal by clicking on the "Past Updates" button in the selected goal  Updated: 12/15/20 Timeframe:  Long-Range Goal Priority:  High Start Date:  06/06/20                           Expected End Date: 05/25/21 Follow Up Date: 03/25/21                     Resolved due to duplicate goals 11/28/10     COMPLETED: Weight (lb) < 200 lb (90.7 kg)       Cut back on portions and get eating under control  Resolved due to duplicate goals 07/07/87

## 2021-08-15 NOTE — Patient Outreach (Signed)
[facility]) Care Management  [place]  08/15/2021   [name] Jun 29, 1946 [id number]  Subjective: Successful telephone outreach call to patient. HIPAA identifiers obtained. Patient states she is dong well at this time. Nurse discussed with patient her health goals and her health and wellness needs which were documented in the Epic system. Patient states she needs a glucometer, nurse called the patient's PCP's office and spoke to [place], and [place] stated she would get an order for a glucometer and send that prescription to the patient's pharmacy for pickup. Patient denies having any recent falls and states her home environment is safe. She reports that she is independent with her ADLs and IADLs. Patient did not have any further questions or concerns today.    Encounter Medications:  Outpatient Encounter Medications as of 08/15/2021  Medication Sig   acetaminophen (TYLENOL) 325 MG tablet Take 650 mg by mouth every 6 (six) hours as needed for headache (pain).   Alcohol Swabs (ALCOHOL PREP) PADS Use to test 1 X day and diagnosis code is E 11.9   amLODipine (NORVASC) 5 MG tablet Take 1 tablet by mouth once daily   aspirin 81 MG chewable tablet Chew 1 tablet (81 mg total) by mouth daily.   diclofenac (VOLTAREN) 75 MG EC tablet Take 1 tablet by mouth twice daily   furosemide (LASIX) 20 MG tablet Take 1 tablet by mouth once daily   glipiZIDE (GLUCOTROL) 10 MG tablet Take 1 tablet (10 mg total) by mouth 2 (two) times daily before a meal.   glucose blood (ONETOUCH VERIO) test strip USE 1 STRIP TO CHECK GLUCOSE ONCE DAILY   Lancets (ONETOUCH ULTRASOFT) lancets USE   TO CHECK GLUCOSE ONCE DAILY   lisinopril (ZESTRIL) 10 MG tablet Take 2 tablets (20 mg total) by mouth daily.   metFORMIN (GLUCOPHAGE) 500 MG tablet Take 1 tablet (500 mg total) by mouth 2 (two) times daily with a meal.   metoprolol succinate (TOPROL-XL) 50 MG 24 hr tablet TAKE 1 TABLET BY MOUTH ONCE DAILY  WITH OR  IMMEDIATELY  FOLLOWING  A  MEAL   rosuvastatin (CRESTOR) 40 MG tablet Take one tablet by mouth daily   sitaGLIPtin (JANUVIA) 100 MG tablet Take 1 tablet (100 mg total) by mouth daily.   spironolactone (ALDACTONE) 25 MG tablet Take 1 tablet by mouth once daily   No facility-administered encounter medications on file as of 08/15/2021.    Functional Status:  In your present state of health, do you have any difficulty performing the following activities: 08/15/2021 06/23/2021  Hearing? N N  Vision? N N  Difficulty concentrating or making decisions? N N  Comment - -  Walking or climbing stairs? Y N  Comment uses walker to assist with walking -  Dressing or bathing? N N  Doing errands, shopping? N N  Comment reports that she is able to run errands, buy groceries and prepare meals -  Preparing Food and eating ? N N  Using the Toilet? N N  In the past six months, have you accidently leaked urine? N N  Do you have problems with loss of bowel control? N N  Managing your Medications? N N  Managing your Finances? N N  Housekeeping or managing your Housekeeping? N N  Some recent data might be hidden    Fall/Depression Screening: Fall Risk  06/23/2021 05/16/2021 12/15/2020  Falls in the past year? 0 0 1  Comment - - -  Number falls in past yr:  0 - 0  Injury with Fall? 0 - 0  Risk for fall due to : - - Impaired mobility;Impaired balance/gait;History of fall(s)  Follow up Falls evaluation completed - Falls prevention discussed;Education provided;Falls evaluation completed   PHQ 2/9 Scores 08/15/2021 06/23/2021 06/23/2021 05/16/2021 06/08/2020 03/24/2020 01/23/2019  PHQ - 2 Score 0 0 0 0 0 0 0  PHQ- 9 Score - - - - 0 - 1    Assessment:   Care Plan Care Plan : Diabetes Type 2 (Adult)  Updates made by Michiel Cowboy, RN since 08/15/2021 12:00 AM     Problem: Glycemic Management (Diabetes, Type 2)   Priority: High     Long-Range Goal: Glycemic Management Optimized as evidenced by A1c less  than 8   Start Date: 08/15/2021  Expected End Date: 08/25/2022  Recent Progress: Not on track  Priority: High  Note:   Evidence-based guidance:  Anticipate A1C testing (point-of-care) every 3 to 6 months based on goal attainment.  Review mutually-set A1C goal or target range.  Anticipate use of antihyperglycemic with or without insulin and periodic adjustments; consider active involvement of pharmacist.  Provide medical nutrition therapy and development of individualized eating.  Compare self-reported symptoms of hypo or hyperglycemia to blood glucose levels, diet and fluid intake, current medications, psychosocial and physiologic stressors, change in activity and barriers to care adherence.  Promote self-monitoring of blood glucose levels.  Assess and address barriers to management plan, such as food insecurity, age, developmental ability, depression, anxiety, fear of hypoglycemia or weight gain, as well as medication cost, side effects and complicated regimen.  Consider referral to community-based diabetes education program, visiting nurse, community health worker or health coach.  Encourage regular dental care for treatment of periodontal disease; refer to dental provider when needed.   Notes:     Task: Alleviate Barriers to Glycemic Management   Due Date: 08/25/2022  Note:   Care Management Activities:    - barriers to adherence to treatment plan identified - blood glucose monitoring encouraged - blood glucose readings reviewed - self-awareness of signs/symptoms of hypo or hyperglycemia encouraged - use of blood glucose monitoring log promoted    Notes:     Care Plan : Hypertension (Adult)  Updates made by Michiel Cowboy, RN since 08/15/2021 12:00 AM     Problem: Hypertension (Hypertension)   Priority: Medium     Long-Range Goal: Hypertension Monitored   Start Date: 08/15/2021  Expected End Date: 08/25/2022  Note:   Evidence-based guidance:  Promote initial use of ambulatory  blood pressure measurements (for 3 days) to rule out "white-coat" effect; identify masked hypertension and presence or absence of nocturnal "dipping" of blood pressure.   Encourage continued use of home blood pressure monitoring and recording in blood pressure log; include symptoms of hypotension or potential medication side effects in log.  Review blood pressure measurements taken inside and outside of the provider office; establish baseline and monitor trends; compare to target ranges or patient goal.  Share overall cardiovascular risk with patient; encourage changes to lifestyle risk factors, including alcohol consumption, smoking, inadequate exercise, poor dietary habits and stress.   Notes:     Task: Identify and Monitor Blood Pressure Elevation   Due Date: 08/25/2022  Note:   Care Management Activities:    - blood pressure trends reviewed - home or ambulatory blood pressure monitoring encouraged    Notes:     Problem: Disease Progression (Hypertension)   Priority: Medium     Long-Range Goal:  Disease Progression Prevented or Minimized   Start Date: 08/15/2021  Expected End Date: 08/25/2022  Note:   Evidence-based guidance:  Tailor lifestyle advice to individual; review progress regularly; give frequent encouragement and respond positively to incremental successes.  Assess for and promote awareness of worsening disease or development of comorbidity.  Prepare patient for laboratory and diagnostic exams based on risk and presentation.  Prepare patient for use of pharmacologic therapy that may include diuretic, beta-blocker, beta-blocker/thiazide combination, angiotensin-converting enzyme inhibitor, renin-angiotensin blocker or calcium-channel blocker.  Expect periodic adjustments to pharmacologic therapy; manage side effects.  Promote a healthy diet that includes primarily plant-based foods, such as fruits, vegetables, whole grains, beans and legumes, low-fat dairy and lean meats.    Consider moderate reduction in sodium intake by avoiding the addition of salt to prepared foods and limiting processed meats, canned soup, frozen meals and salty snacks.   Promote a regular, daily exercise goal of 150 minutes per week of moderate exercise based on tolerance, ability and patient choice; consider referral to physical therapist, community wellness and/or activity program.  Encourage the avoidance of no more than 2 hours per day of sedentary activity, such as recreational screen time.  Review sources of stress; explore current coping strategies and encourage use of mindfulness, yoga, meditation or exercise to manage stress.   Notes:     Task: Alleviate Barriers to Hypertension Treatment   Due Date: 08/25/2022  Note:   Care Management Activities:    - difficulty of making life-long changes acknowledged - healthy diet promoted - medical nutrition therapy provided - medication side effects managed - reduction of dietary sodium encouraged    Notes:       Goals Addressed             This Visit's Progress    ([name]) Monitor and Manage My Blood Sugar-Diabetes Type 2       Barriers: Health Behaviors Knowledge Timeframe:  Long-Range Goal Priority:  High Start Date: 12/15/20                           Expected End Date: 11/25/21              Follow Up Date 11/24/21 - check blood sugar at prescribed times - check blood sugar if I feel it is too high or too low - enter blood sugar readings and medication or insulin into daily log -Discussed limiting sugar and carbohydrates in patient's diet -Encouraged patient to not sit for long periods and to continue to get up often to do house-hold tasks   Why is this important?   Checking your blood sugar at home helps to keep it from getting very high or very low.  Writing the results in a diary or log helps the doctor know how to care for you.  Your blood sugar log should have the time, date and the results.  Also, write down the  amount of insulin or other medicine that you take.  Other information, like what you ate, exercise done and how you were feeling, will also be helpful.     Notes: 08/15/21: Patient reports that her blood sugar meter is not working and states she has not taken her blood sugar in several weeks. Nurse called the patient's PCP's office and spoke to [place] who states she will get an order for a glucometer and send it to the patient's pharmacy for pickup. Patient's explains that her A1c has increased to  9.6. She wants to work towards lowering her A1c. Patient states she has begun to limit the amount of bread, rice, noodles, sweets and junk food that she was eating previously. Nurse will send patient a planning healthy meals booklet.  12/15/20: Patient states she is taking her blood sugar 1-2 times daily and she does record the values.  05/16/21 Patient reports blood sugars have been in the 200's some but less than 300. Admits to not following diet at times.  Encouraged to limit sweets and carbohydrates.   Diabetes Management Discussed: Medication adherence Reviewed importance of limiting carbs such as rice, potatoes, breads, and pastas. Also discussed limiting sweets and sugary drinks.  Discussed importance of portion control.  Also discussed importance of annual exams, foot exams, and eye exams.        [facility]) Obtain Eye Exam-Diabetes Type 2       Timeframe:  Long-Range Goal Priority:  Medium Start Date:  08/15/21                           Expected End Date: 11/24/21                      Follow Up Date 11/24/21    - schedule appointment with eye doctor -Discussed her last eye exam was 07/18/21    Why is this important?   Eye check-ups are important when you have diabetes.  Vision loss can be prevented.    Notes: 08/15/21: Patient states she will call Dr. Zenia Resides office and schedule her annual eye exam.     [facility]) Set My Target A1C-Diabetes Type 2       Timeframe:  Short-Term Goal Priority:   Low Start Date:   12/15/20                          Expected End Date: 12/15/20                      Follow Up Date 12/15/20    - set target A1C    Why is this important?   Your target A1C is decided together by you and your doctor.  It is based on several things like your age and other health issues.    Notes: 08/15/21: Patient reports that her A1c has increased to 9.6. Patient states that she has begun to adhere to a diabetic diet and she will work towards lowering her A1c.  12/15/20: Patient set a goal of 6.5 with her provider     [facility]) Track and Manage My Blood Pressure-Hypertension       Timeframe:  Long-Range Goal Priority:  Medium Start Date:  08/15/21                           Expected End Date: 08/25/22                      Follow Up Date 11/24/21    - check blood pressure 3 times per week - write blood pressure results in a log or diary   -Encouraged patient to maintain a low sodium diet -Encouraged patient to not sit for long periods and to continue to get up often to do house hold chores, dicussed not being sedentary -Discussed that uncontrolled high blood pressure can cause stroke, vision loss, and kidney disease  Why  is this important?   You won't feel high blood pressure, but it can still hurt your blood vessels.  High blood pressure can cause heart or kidney problems. It can also cause a stroke.  Making lifestyle changes like losing a little weight or eating less salt will help.  Checking your blood pressure at home and at different times of the day can help to control blood pressure.  If the doctor prescribes medicine remember to take it the way the doctor ordered.  Call the office if you cannot afford the medicine or if there are questions about it.     Notes: 08/15/21: Patient states she will start to take her blood pressure and record the values at least 3 times a week.Nurse will send hypertension education and a calendar booklet for patient to record her B/P and  blood sugar values.     COMPLETED: Exercise 150 minutes per week (moderate activity)       May consider going back to the y; riding the bike  Resolved due to duplicate goals 3/53/61     COMPLETED: Patient Stated       Go back to the gym; [name]  Will take exercise class, silver sneaker Ride the bike  Resolved due to duplicate goals 4/43/15       COMPLETED: Patient Stated       Return to [facility] doing low-impact exercise Go fishing with your great-grandson Resolved due to duplicate goals 4/00/86     COMPLETED: Patient will report reduction in A1c by 0.5-1 points within the next 90 days       [place] (see longtitudinal plan of care for additional care plan information)  Objective:  Lab Results  Component Value Date   HGBA1C 8.2 (H) 03/30/2020   Lab Results  Component Value Date   CREATININE 0.96 03/30/2020   CREATININE 0.97 06/29/2019   CREATININE 0.86 06/28/2019   No results found for: EGFR  Current Barriers:  Knowledge Deficits related to basic Diabetes pathophysiology and self care/management  Case Manager Clinical Goal(s):  Over the next 90 days, patient will demonstrate improved adherence to prescribed treatment plan for diabetes self care/management as evidenced by:  Verbalize daily monitoring and recording of CBG within 90 days Verbalize adherence to ADA/ carb modified diet within the next 90 days Verbalize exercise 2-3 days/week RN encouraged patient to continue with daily FBS monitoring, increase physical activity as tolerated, and to monitor sugar and carbohydrates closely. RN discussed decreasing portion sizes and other weight loss tips with patient.  Interventions:  Patient Self Care Activities:  Self administers oral medications as prescribed Attends all scheduled provider appointments Checks blood sugars as prescribed and utilize hyper and hypoglycemia protocol as needed Patient states that she has not done very well sticking to her ADA diet.  She has not been motivated. Patient states she has all of the diabetic education she needs, "I just need to eat right and do better".   Please see past updates related to this goal by clicking on the "Past Updates" button in the selected goal  Updated: 12/15/20 Timeframe:  Long-Range Goal Priority:  High Start Date:  06/06/20                           Expected End Date: 05/25/21 Follow Up Date: 03/25/21                     Resolved due to duplicate  goals 08/15/21     COMPLETED: Weight (lb) < 200 lb (90.7 kg)       Cut back on portions and get eating under control  Resolved due to duplicate goals 3/39/17          Plan: [place] will send PCP today's assessment note, with send patient hypertension education, a calendar booklet and a planning healthy meals booklet, and will call patient within the month of December. Follow-up: Patient agrees to Care Plan and Follow-up.  Emelia Loron RN, BSN [place] [phone] [email or web address]

## 2021-08-16 ENCOUNTER — Ambulatory Visit: Payer: Self-pay | Admitting: *Deleted

## 2021-08-21 ENCOUNTER — Other Ambulatory Visit: Payer: Self-pay | Admitting: Family Medicine

## 2021-09-15 LAB — HM HEPATITIS C SCREENING LAB: HM Hepatitis Screen: NEGATIVE

## 2021-09-17 ENCOUNTER — Other Ambulatory Visit: Payer: Self-pay | Admitting: Family Medicine

## 2021-09-26 ENCOUNTER — Encounter: Payer: Self-pay | Admitting: Family Medicine

## 2021-10-12 DIAGNOSIS — Z1231 Encounter for screening mammogram for malignant neoplasm of breast: Secondary | ICD-10-CM | POA: Diagnosis not present

## 2021-10-12 LAB — HM MAMMOGRAPHY

## 2021-10-13 ENCOUNTER — Encounter: Payer: Self-pay | Admitting: Family Medicine

## 2021-10-15 ENCOUNTER — Other Ambulatory Visit: Payer: Self-pay | Admitting: Family Medicine

## 2021-10-17 DIAGNOSIS — Z961 Presence of intraocular lens: Secondary | ICD-10-CM | POA: Diagnosis not present

## 2021-10-17 DIAGNOSIS — E119 Type 2 diabetes mellitus without complications: Secondary | ICD-10-CM | POA: Diagnosis not present

## 2021-10-17 DIAGNOSIS — H04123 Dry eye syndrome of bilateral lacrimal glands: Secondary | ICD-10-CM | POA: Diagnosis not present

## 2021-10-17 LAB — HM DIABETES EYE EXAM

## 2021-10-30 DIAGNOSIS — M85852 Other specified disorders of bone density and structure, left thigh: Secondary | ICD-10-CM | POA: Diagnosis not present

## 2021-10-30 DIAGNOSIS — M85851 Other specified disorders of bone density and structure, right thigh: Secondary | ICD-10-CM | POA: Diagnosis not present

## 2021-10-30 LAB — HM DEXA SCAN

## 2021-11-13 ENCOUNTER — Encounter: Payer: Self-pay | Admitting: Family Medicine

## 2021-11-13 ENCOUNTER — Telehealth: Payer: Self-pay | Admitting: Family Medicine

## 2021-11-13 NOTE — Telephone Encounter (Signed)
Spoke to pt and relayed results from Bone Density. Pt verbalized understanding.

## 2021-11-14 ENCOUNTER — Other Ambulatory Visit: Payer: Self-pay | Admitting: *Deleted

## 2021-11-14 NOTE — Patient Outreach (Signed)
[place] [facility]) Care Management  11/14/2021  [name] 09-27-1946 [id number]  [place] [facility]) Care Management RN Health Coach Note   11/14/2021 Name:  [name] MRN:  [id number] DOB:  [birth year]  Summary: Patient states she is doing well. Reports that her blood sugar values have improve and she has lost around 15 pounds by decreasing her portions and eating healthier. Patient explains that she has not been taking her B/P at home. Education was provided and patient states will start to take her B/P 3 times a week. Patient states her home environment is safe and she is supported by her grandson.   Recommendations/Changes made from today's visit: Monitor your B/P 3 times a week and your blood sugar daily Limit sodium, sugar, and carbohydrates in diet Do not sit for long periods; get up and move every one hour around your home   Subjective: [name] is an 75 y.o. year old female who is a primary patient of Laurey Morale, MD. The care management team was consulted for assistance with care management and/or care coordination needs.    RN Health Coach completed Telephone Visit today.   Objective:  Medications Reviewed Today     Reviewed by Michiel Cowboy, RN (Registered Nurse) on 11/14/21 at 1353  Med List Status: <None>   Medication Order Taking? Sig Documenting Provider Last Dose Status Informant  acetaminophen (TYLENOL) 325 MG tablet [id number] Yes Take 650 mg by mouth every 6 (six) hours as needed for headache (pain). [provider] Taking Active   Alcohol Swabs (ALCOHOL PREP) PADS [id number] Yes Use to test 1 X day and diagnosis code is E 11.9 Laurey Morale, MD Taking Active Other  amLODipine (NORVASC) 5 MG tablet [id number] Yes Take 1 tablet by mouth once daily Laurey Morale, MD Taking Active   aspirin 81 MG chewable tablet [id number] Yes Chew 1 tablet (81 mg total) by mouth daily. Deatra James, MD Taking Active    Blood Glucose Monitoring Suppl [facility] VERIO) w/Device Drucie Opitz [id number] Yes Use to check blood sugar two times daily Laurey Morale, MD Taking Active   diclofenac (VOLTAREN) 75 MG EC tablet [id number] Yes Take 1 tablet by mouth twice daily Laurey Morale, MD Taking Active   furosemide (LASIX) 20 MG tablet [id number] Yes Take 1 tablet by mouth once daily Laurey Morale, MD Taking Active   glipiZIDE (GLUCOTROL) 10 MG tablet [id number] Yes Take 1 tablet (10 mg total) by mouth 2 (two) times daily before a meal. Laurey Morale, MD Taking Active   Lancets [facility] ULTRASOFT) lancets [id number] Yes USE   TO CHECK GLUCOSE ONCE DAILY Laurey Morale, MD Taking Active   lisinopril (ZESTRIL) 10 MG tablet [id number] Yes Take 2 tablets (20 mg total) by mouth daily. Laurey Morale, MD Taking Active   metFORMIN (GLUCOPHAGE) 500 MG tablet [id number] Yes TAKE 1 TABLET BY MOUTH TWICE DAILY WITH A MEAL Laurey Morale, MD Taking Active   metoprolol succinate (TOPROL-XL) 50 MG 24 hr tablet [id number] Yes TAKE 1 TABLET BY MOUTH ONCE DAILY WITH OR  IMMEDIATELY  FOLLOWING  A  MEAL Laurey Morale, MD Taking Active   [facility] VERIO test strip [id number] Yes USE 1 STRIP TO CHECK GLUCOSE ONCE DAILY Laurey Morale, MD Taking Active   rosuvastatin (CRESTOR) 40 MG tablet [id number] Yes Take 1 tablet by mouth once daily Laurey Morale, MD Taking Active   sitaGLIPtin (JANUVIA) 100 MG tablet  [id number] Yes Take 1 tablet (100 mg total) by mouth daily. Laurey Morale, MD Taking Active   spironolactone (ALDACTONE) 25 MG tablet [id number] Yes Take 1 tablet by mouth once daily Laurey Morale, MD Taking Active              SDOH:  (Social Determinants of Health) assessments and interventions performed: SDOH assessments completed today and documented in the Epic system.    Care Plan  Review of patient past medical history, allergies, medications, health status, including review of consultants reports, laboratory and other test data, was  performed as part of comprehensive evaluation for care management services.   Care Plan : Diabetes Type 2 (Adult)  Updates made by Michiel Cowboy, RN since 11/14/2021 12:00 AM     Problem: Glycemic Management (Diabetes, Type 2) Resolved 11/14/2021  Priority: High     Long-Range Goal: Glycemic Management Optimized as evidenced by A1c less than 8 Completed 11/14/2021  Start Date: 08/15/2021  Expected End Date: 08/25/2022  Recent Progress: Not on track  Priority: High  Note:   Resolving due to duplicate goal  Evidence-based guidance:  Anticipate A1C testing (point-of-care) every 3 to 6 months based on goal attainment.  Review mutually-set A1C goal or target range.  Anticipate use of antihyperglycemic with or without insulin and periodic adjustments; consider active involvement of pharmacist.  Provide medical nutrition therapy and development of individualized eating.  Compare self-reported symptoms of hypo or hyperglycemia to blood glucose levels, diet and fluid intake, current medications, psychosocial and physiologic stressors, change in activity and barriers to care adherence.  Promote self-monitoring of blood glucose levels.  Assess and address barriers to management plan, such as food insecurity, age, developmental ability, depression, anxiety, fear of hypoglycemia or weight gain, as well as medication cost, side effects and complicated regimen.  Consider referral to community-based diabetes education program, visiting nurse, community health worker or health coach.  Encourage regular dental care for treatment of periodontal disease; refer to dental provider when needed.   Notes:     Task: Alleviate Barriers to Glycemic Management Completed 11/14/2021  Due Date: 08/25/2022  Note:   Care Management Activities:    - barriers to adherence to treatment plan identified - blood glucose monitoring encouraged - blood glucose readings reviewed - self-awareness of signs/symptoms of hypo or  hyperglycemia encouraged - use of blood glucose monitoring log promoted    Notes:     Problem: Disease Progression (Diabetes, Type 2) Resolved 11/14/2021  Priority: High     Care Plan : Hypertension (Adult)  Updates made by Michiel Cowboy, RN since 11/14/2021 12:00 AM     Problem: Hypertension (Hypertension) Resolved 11/14/2021  Priority: Medium     Long-Range Goal: Hypertension Monitored Completed 11/14/2021  Start Date: 08/15/2021  Expected End Date: 08/25/2022  Note:   Resolving due to duplicate goal  Evidence-based guidance:  Promote initial use of ambulatory blood pressure measurements (for 3 days) to rule out "white-coat" effect; identify masked hypertension and presence or absence of nocturnal "dipping" of blood pressure.   Encourage continued use of home blood pressure monitoring and recording in blood pressure log; include symptoms of hypotension or potential medication side effects in log.  Review blood pressure measurements taken inside and outside of the provider office; establish baseline and monitor trends; compare to target ranges or patient goal.  Share overall cardiovascular risk with patient; encourage changes to lifestyle risk factors, including alcohol consumption, smoking, inadequate exercise, poor dietary habits and stress.  Notes:     Task: Identify and Monitor Blood Pressure Elevation Completed 11/14/2021  Due Date: 08/25/2022  Note:   Care Management Activities:    - blood pressure trends reviewed - home or ambulatory blood pressure monitoring encouraged    Notes:     Problem: Disease Progression (Hypertension) Resolved 11/14/2021  Priority: Medium     Long-Range Goal: Disease Progression Prevented or Minimized Completed 11/14/2021  Start Date: 08/15/2021  Expected End Date: 08/25/2022  Note:   Resolving due to duplicate goal  Evidence-based guidance:  Tailor lifestyle advice to individual; review progress regularly; give frequent encouragement  and respond positively to incremental successes.  Assess for and promote awareness of worsening disease or development of comorbidity.  Prepare patient for laboratory and diagnostic exams based on risk and presentation.  Prepare patient for use of pharmacologic therapy that may include diuretic, beta-blocker, beta-blocker/thiazide combination, angiotensin-converting enzyme inhibitor, renin-angiotensin blocker or calcium-channel blocker.  Expect periodic adjustments to pharmacologic therapy; manage side effects.  Promote a healthy diet that includes primarily plant-based foods, such as fruits, vegetables, whole grains, beans and legumes, low-fat dairy and lean meats.   Consider moderate reduction in sodium intake by avoiding the addition of salt to prepared foods and limiting processed meats, canned soup, frozen meals and salty snacks.   Promote a regular, daily exercise goal of 150 minutes per week of moderate exercise based on tolerance, ability and patient choice; consider referral to physical therapist, community wellness and/or activity program.  Encourage the avoidance of no more than 2 hours per day of sedentary activity, such as recreational screen time.  Review sources of stress; explore current coping strategies and encourage use of mindfulness, yoga, meditation or exercise to manage stress.   Notes:     Task: Alleviate Barriers to Hypertension Treatment Completed 11/14/2021  Due Date: 08/25/2022  Note:   Care Management Activities:    - difficulty of making life-long changes acknowledged - healthy diet promoted - medical nutrition therapy provided - medication side effects managed - reduction of dietary sodium encouraged    Notes:     Care Plan : [place] of Care  Updates made by Michiel Cowboy, RN since 11/14/2021 12:00 AM     Problem: Knowledge Deficit Related to Diabetes and Hypertension   Priority: High     Long-Range Goal: Development of Plan of Care for  Diabetes and Hypertension   Start Date: 11/14/2021  Expected End Date: 11/24/2022  Priority: High  Note:   Current Barriers:  Knowledge Deficits related to plan of care for management of HTN and DMII   RNCM Clinical Goal(s):  Patient will take all medications exactly as prescribed and will call provider for medication related questions as evidenced by patient's verbalization that she is taking her medications as prescribed and contacts her providers for questions and concerns demonstrate Improved adherence to prescribed treatment plan for HTN and DMII as evidenced by monitoring her blood sugar daily and B/P 3 times a week and contacting her provider for concerning values; adhering to a low sodium and diabetic diet; decreasing A1c to below 7 and maintaining B/P < 140/90 continue to work with RN Care Manager to address care management and care coordination needs related to  HTN and DMII as evidenced by adherence to CM Team Scheduled appointments through collaboration with RN Care manager, provider, and care team.   Interventions: Inter-disciplinary care team collaboration (see longitudinal plan of care) Evaluation of current treatment plan related to  self  management and patient's adherence to plan as established by provider   Diabetes Interventions:  (Status:  Goal on track:  Yes.) Long Term Goal Assessed patient's understanding of A1c goal: <6.5% Provided education to patient about basic DM disease process Reviewed medications with patient and discussed importance of medication adherence Reviewed scheduled/upcoming provider appointments including: patient states she will make an appointment to go see her PCP to follow up regarding her A1c Advised patient, providing education and rationale, to check cbg daily and record, calling PCP for findings outside established parameters Encouraged patient not to sit for long periods; get up and move every one hour around your home Encouraged patient to  limit sugar and carbohydrates in her diet Nurse will send hypoglycemia education Lab Results  Component Value Date   HGBA1C 9.6 (A) 07/21/2021   Hypertension Interventions:  (Status:  Goal on track:  NO.) Long Term Goal Last practice recorded BP readings:  BP Readings from Last 3 Encounters:  07/21/21 (!) 142/86  07/26/20 (!) 166/78  05/26/20 138/80  Most recent eGFR/CrCl: No results found for: EGFR  No components found for: CRCL  Evaluation of current treatment plan related to hypertension self management and patient's adherence to plan as established by provider Reviewed medications with patient and discussed importance of compliance Provided education on prescribed diet low sodium Discussed complications of poorly controlled blood pressure such as heart disease, stroke, circulatory complications, vision complications, kidney impairment, sexual dysfunction Assessed social determinant of health barriers Encouraged patient not to sit for long periods; get up and move every one hour around your home Discussed taking B/P at least 3 times a week, record the value, and bring the values to her provider appointments Nurse will send hypertension education  Patient Goals/Self-Care Activities: Take all medications as prescribed Attend all scheduled provider appointments Call pharmacy for medication refills 3-7 days in advance of running out of medications keep appointment with eye doctor check feet daily for cuts, sores or redness enter blood sugar readings and medication or insulin into daily log take the blood sugar log to all doctor visits set goal weight trim toenails straight across drink 6 to 8 glasses of water each day fill half of plate with vegetables manage portion size wash and dry feet carefully every day wear comfortable, well-fitting shoes check blood pressure 3 times per week write blood pressure results in a log or diary take blood pressure log to all doctor  appointments call doctor for signs and symptoms of high blood pressure keep all doctor appointments take medications for blood pressure exactly as prescribed eat more whole grains, fruits and vegetables, lean meats and healthy fats Limit sodium, sugar, and carbohydrates in diet Do not sit for long periods; get up and move every one hour around your home  Follow Up Plan:  Telephone follow up appointment with care management team member scheduled for:  March       Plan: Telephone follow up appointment with care management team member scheduled for:  March. Nurse will  send PCP a quarterly update.  Emelia Loron RN, [place] [phone] [email or web address]

## 2021-11-14 NOTE — Patient Instructions (Addendum)
Visit Information  Thank you for taking time to visit with me today. Please don't hesitate to contact me if I can be of assistance to you before our next scheduled telephone appointment.  Following are the goals we discussed today:  Patient Goals/Self-Care Activities: Take all medications as prescribed Attend all scheduled provider appointments Call pharmacy for medication refills 3-7 days in advance of running out of medications keep appointment with eye doctor check feet daily for cuts, sores or redness enter blood sugar readings and medication or insulin into daily log take the blood sugar log to all doctor visits set goal weight trim toenails straight across drink 6 to 8 glasses of water each day fill half of plate with vegetables manage portion size wash and dry feet carefully every day wear comfortable, well-fitting shoes check blood pressure 3 times per week write blood pressure results in a log or diary take blood pressure log to all doctor appointments call doctor for signs and symptoms of high blood pressure keep all doctor appointments take medications for blood pressure exactly as prescribed eat more whole grains, fruits and vegetables, lean meats and healthy fats Limit sodium, sugar, and carbohydrates in diet Do not sit for long periods; get up and move every one hour around your home  Follow Up Plan:  Telephone follow up appointment with care management team member scheduled for: March   The patient verbalized understanding of instructions, educational materials, and care plan provided today and agreed to receive a mailed copy of patient instructions, educational materials, and care plan.   Telephone follow up appointment with care management team member scheduled VWP:VXYIA  Emelia Loron RN, [place] [phone] [email or web address]

## 2021-11-16 ENCOUNTER — Other Ambulatory Visit: Payer: Self-pay

## 2021-11-16 ENCOUNTER — Telehealth: Payer: Self-pay

## 2021-11-16 MED ORDER — RISEDRONATE SODIUM 5 MG PO TABS: 5.0000 mg | ORAL_TABLET | Freq: Every day | ORAL | 3 refills | Status: DC

## 2021-11-16 NOTE — Telephone Encounter (Signed)
Reviewed bone density report with pt aware that Dr Sarajane Jews advise her to  start taking Actonel 5 mg daily and to continue taking Calcium 1500 mg and Vit D 1,000 units, pt verbalized understanding

## 2022-01-02 ENCOUNTER — Other Ambulatory Visit: Payer: Self-pay | Admitting: Family Medicine

## 2022-01-09 ENCOUNTER — Other Ambulatory Visit: Payer: Self-pay | Admitting: Family Medicine

## 2022-01-31 ENCOUNTER — Other Ambulatory Visit: Payer: Medicare Other | Admitting: *Deleted

## 2022-01-31 NOTE — Patient Outreach (Signed)
[place] [facility]) Care Management ? ?01/31/2022 ? ?[name] ?13-Mar-1946 ?[id number] ? ?Outreach attempt to patient. No answer and unable to leave voicemail message due to phone rang until a VM stated to enter an access code. ? ?Plan: RN Health Coach will call patient within the month of April. ? ?Emelia Loron RN, BSN ?[facility] Care Management  ?RN Health Coach ?[phone] ?[email or web address] ? ?

## 2022-02-05 ENCOUNTER — Other Ambulatory Visit: Payer: Self-pay | Admitting: Family Medicine

## 2022-02-06 ENCOUNTER — Ambulatory Visit (INDEPENDENT_AMBULATORY_CARE_PROVIDER_SITE_OTHER): Payer: Medicare Other | Admitting: Family Medicine

## 2022-02-06 ENCOUNTER — Encounter: Payer: Self-pay | Admitting: Family Medicine

## 2022-02-06 VITALS — BP 140/80 | HR 53 | Temp 98.6°F | Wt 232.0 lb

## 2022-02-06 DIAGNOSIS — E782 Mixed hyperlipidemia: Secondary | ICD-10-CM | POA: Diagnosis not present

## 2022-02-06 DIAGNOSIS — E1140 Type 2 diabetes mellitus with diabetic neuropathy, unspecified: Secondary | ICD-10-CM

## 2022-02-06 DIAGNOSIS — I636 Cerebral infarction due to cerebral venous thrombosis, nonpyogenic: Secondary | ICD-10-CM | POA: Diagnosis not present

## 2022-02-06 DIAGNOSIS — I10 Essential (primary) hypertension: Secondary | ICD-10-CM

## 2022-02-06 DIAGNOSIS — R600 Localized edema: Secondary | ICD-10-CM

## 2022-02-06 LAB — HEPATIC FUNCTION PANEL
ALT: 21 U/L (ref 0–35)
AST: 21 U/L (ref 0–37)
Albumin: 4.3 g/dL (ref 3.5–5.2)
Alkaline Phosphatase: 57 U/L (ref 39–117)
Bilirubin, Direct: 0 mg/dL (ref 0.0–0.3)
Total Bilirubin: 0.4 mg/dL (ref 0.2–1.2)
Total Protein: 7.8 g/dL (ref 6.0–8.3)

## 2022-02-06 LAB — CBC WITH DIFFERENTIAL/PLATELET
Basophils Absolute: 0.1 10*3/uL (ref 0.0–0.1)
Basophils Relative: 0.9 % (ref 0.0–3.0)
Eosinophils Absolute: 0.3 10*3/uL (ref 0.0–0.7)
Eosinophils Relative: 3.6 % (ref 0.0–5.0)
HCT: 37.4 % (ref 36.0–46.0)
Hemoglobin: 12.2 g/dL (ref 12.0–15.0)
Lymphocytes Relative: 31.3 % (ref 12.0–46.0)
Lymphs Abs: 2.6 10*3/uL (ref 0.7–4.0)
MCHC: 32.6 g/dL (ref 30.0–36.0)
MCV: 93.9 fl (ref 78.0–100.0)
Monocytes Absolute: 1.1 10*3/uL — ABNORMAL HIGH (ref 0.1–1.0)
Monocytes Relative: 12.8 % — ABNORMAL HIGH (ref 3.0–12.0)
Neutro Abs: 4.3 10*3/uL (ref 1.4–7.7)
Neutrophils Relative %: 51.4 % (ref 43.0–77.0)
Platelets: 192 10*3/uL (ref 150.0–400.0)
RBC: 3.99 Mil/uL (ref 3.87–5.11)
RDW: 15.2 % (ref 11.5–15.5)
WBC: 8.3 10*3/uL (ref 4.0–10.5)

## 2022-02-06 LAB — BASIC METABOLIC PANEL
BUN: 24 mg/dL — ABNORMAL HIGH (ref 6–23)
CO2: 28 mEq/L (ref 19–32)
Calcium: 10.6 mg/dL — ABNORMAL HIGH (ref 8.4–10.5)
Chloride: 102 mEq/L (ref 96–112)
Creatinine, Ser: 1.23 mg/dL — ABNORMAL HIGH (ref 0.40–1.20)
GFR: 43.1 mL/min — ABNORMAL LOW (ref >60.00)
Glucose, Bld: 126 mg/dL — ABNORMAL HIGH (ref 70–99)
Potassium: 4.9 mEq/L (ref 3.5–5.1)
Sodium: 138 mEq/L (ref 135–145)

## 2022-02-06 LAB — HEMOGLOBIN A1C: Hgb A1c MFr Bld: 7.9 % — ABNORMAL HIGH (ref 4.6–6.5)

## 2022-02-06 LAB — LIPID PANEL
Cholesterol: 187 mg/dL (ref 0–200)
HDL: 66.2 mg/dL (ref >39.00)
LDL Cholesterol: 93 mg/dL (ref 0–99)
NonHDL: 121.11
Total CHOL/HDL Ratio: 3
Triglycerides: 139 mg/dL (ref 0.0–149.0)
VLDL: 27.8 mg/dL (ref 0.0–40.0)

## 2022-02-06 LAB — TSH: TSH: 2.03 u[IU]/mL (ref 0.35–5.50)

## 2022-02-06 MED ORDER — ROSUVASTATIN CALCIUM 40 MG PO TABS: 40.0000 mg | ORAL_TABLET | Freq: Every day | ORAL | 3 refills | Status: DC

## 2022-02-06 MED ORDER — DICLOFENAC SODIUM 75 MG PO TBEC: 75.0000 mg | DELAYED_RELEASE_TABLET | Freq: Two times a day (BID) | ORAL | 3 refills | Status: DC

## 2022-02-06 MED ORDER — METFORMIN HCL 500 MG PO TABS: 500.0000 mg | ORAL_TABLET | Freq: Two times a day (BID) | ORAL | 3 refills | Status: DC

## 2022-02-06 NOTE — Progress Notes (Signed)
? ?  Subjective:  ? ? Patient ID: [name], female    DOB: 1946-10-22, 76 y.o.   MRN: [id number] ? ?HPI ?Here to follow up on issues. She is doing well in general. She tried Actonel for her osteoporosis, but she had to stop this because it "made me feel like I'm going backwards". She could not be more specific than that. In fact she declines to take any type of osteoporosis medications now. She is taking calcium and vitamin D at our suggestion. Her BP is stable at home. She is unsteady on her feet so she uses a cane everywhere she goes.  ? ?Review of Systems  ?Constitutional: Negative.   ?Respiratory: Negative.    ?Cardiovascular:  Positive for leg swelling. Negative for chest pain and palpitations.  ?Neurological:  Positive for weakness. Negative for dizziness, seizures, speech difficulty, light-headedness and headaches.  ? ?   ?Objective:  ? Physical Exam ?Constitutional:   ?   Appearance: She is obese.  ?   Comments: Walks with a cane.   ?Cardiovascular:  ?   Rate and Rhythm: Normal rate and regular rhythm.  ?   Pulses: Normal pulses.  ?   Heart sounds: Normal heart sounds.  ?Pulmonary:  ?   Effort: Pulmonary effort is normal.  ?   Breath sounds: Normal breath sounds.  ?Neurological:  ?   Mental Status: She is alert and oriented to person, place, and time. Mental status is at baseline.  ? ? ? ? ? ?   ?Assessment & Plan:  ?She is doing well as far as HTN and leg edema. Her neurologic status has not changed since her stroke. We will get fasting labs today for lipids, an A1c, etc. She will stay on calcium and vitamin D for her osteoporosis and avoid other medications. Recheck a DEXA in 2 years. We spent a total of (35   ) minutes reviewing records and discussing these issues.  ?Alysia Penna, MD ? ? ?

## 2022-02-07 ENCOUNTER — Other Ambulatory Visit: Payer: Self-pay

## 2022-02-07 DIAGNOSIS — E1140 Type 2 diabetes mellitus with diabetic neuropathy, unspecified: Secondary | ICD-10-CM

## 2022-02-07 MED ORDER — EMPAGLIFLOZIN 25 MG PO TABS: 25.0000 mg | ORAL_TABLET | Freq: Every day | ORAL | 2 refills | Status: DC

## 2022-02-12 ENCOUNTER — Ambulatory Visit: Payer: Medicare Other | Admitting: *Deleted

## 2022-02-23 DIAGNOSIS — M47816 Spondylosis without myelopathy or radiculopathy, lumbar region: Secondary | ICD-10-CM | POA: Diagnosis not present

## 2022-02-23 DIAGNOSIS — Z794 Long term (current) use of insulin: Secondary | ICD-10-CM | POA: Diagnosis not present

## 2022-02-23 DIAGNOSIS — I10 Essential (primary) hypertension: Secondary | ICD-10-CM | POA: Diagnosis not present

## 2022-02-23 DIAGNOSIS — Z8673 Personal history of transient ischemic attack (TIA), and cerebral infarction without residual deficits: Secondary | ICD-10-CM | POA: Diagnosis not present

## 2022-02-23 DIAGNOSIS — R011 Cardiac murmur, unspecified: Secondary | ICD-10-CM | POA: Diagnosis not present

## 2022-02-23 DIAGNOSIS — Z0001 Encounter for general adult medical examination with abnormal findings: Secondary | ICD-10-CM | POA: Diagnosis not present

## 2022-02-23 DIAGNOSIS — E785 Hyperlipidemia, unspecified: Secondary | ICD-10-CM | POA: Diagnosis not present

## 2022-02-23 DIAGNOSIS — E1140 Type 2 diabetes mellitus with diabetic neuropathy, unspecified: Secondary | ICD-10-CM | POA: Diagnosis not present

## 2022-02-23 DIAGNOSIS — R2681 Unsteadiness on feet: Secondary | ICD-10-CM | POA: Diagnosis not present

## 2022-02-23 DIAGNOSIS — Z0000 Encounter for general adult medical examination without abnormal findings: Secondary | ICD-10-CM | POA: Diagnosis not present

## 2022-02-28 ENCOUNTER — Other Ambulatory Visit: Payer: Self-pay | Admitting: Family Medicine

## 2022-03-02 ENCOUNTER — Other Ambulatory Visit: Payer: Self-pay | Admitting: *Deleted

## 2022-03-02 NOTE — Patient Outreach (Signed)
[place] [facility]) Care Management ? ?03/02/2022 ? ?[name] ?10-May-1946 ?[id number] ? ?Unsuccessful outreach attempt made to patient. Patient answered the phone and stated that she would not be able to speak today. Patient did request that this nurse call back at a later date.  ? ?Plan: ?RN Health Coach will call patient within the month of April. ? ?Emelia Loron RN, BSN ?[facility] Care Management  ?RN Health Coach ?[phone] ?[email or web address] ? ? ?

## 2022-03-05 ENCOUNTER — Other Ambulatory Visit: Payer: Self-pay | Admitting: *Deleted

## 2022-03-05 NOTE — Patient Outreach (Signed)
[facility]) Care Management ? ?03/05/2022 ? ?[name] ?Apr 15, 1946 ?[id number] ? ?[facility]) Care Management ?RN Health Coach Note ? ? ?03/05/2022 ?Name:  [name] MRN:  [id number] DOB:  March 22, 1946 ? ?Summary: ?Patient states she is doing well. She reports that her A1c decreased to 7.9, she continues to eat a healthier diet, and use portion control to lose weight. Patient states her blood sugar ranges have been <200. Patient admits that she has not been taking her B/P and explains that she know she needs to do better. Nurse provided education and patient states she will begin to take her B/P at least 3 times a week. Patient wants to increase her physical activity by doing chair exercises when she is watching TV. She explains her home environment is safe and did confirm that she has this nurse's contact number to call her if needed.  ? ?Recommendations/Changes made from today's visit: ? ?check blood sugar at prescribed times: once daily ?check blood pressure 3 times per week ?Limit sodium, sugar, and carbohydrates in diet ?Do not sit for long periods and do chair exercises while watching TV ? ?Subjective: ?[name] is an 76 y.o. year old female who is a primary patient of Laurey Morale, MD. The care management team was consulted for assistance with care management and/or care coordination needs.   ? ?RN Health Coach completed Telephone Visit today.  ? ?Objective: ? ?Medications Reviewed Today   ? ? Reviewed by Michiel Cowboy, RN (Registered Nurse) on 03/05/22 at 75  Med List Status: <None>  ? ?Medication Order Taking? Sig Documenting Provider Last Dose Status Informant  ?acetaminophen (TYLENOL) 325 MG tablet [id number] Yes Take 650 mg by mouth every 6 (six) hours as needed for headache (pain). [provider] Taking Active   ?Alcohol Swabs (ALCOHOL PREP) PADS [id number] Yes Use to test 1 X day and diagnosis code is E 11.9 Laurey Morale, MD Taking Active  Other  ?amLODipine (NORVASC) 5 MG tablet [id number] Yes Take 1 tablet by mouth once daily Laurey Morale, MD Taking Active   ?aspirin 81 MG chewable tablet [id number] Yes Chew 1 tablet (81 mg total) by mouth daily. Deatra James, MD Taking Active   ?Blood Glucose Monitoring Suppl [facility] VERIO) w/Device KIT [id number] Yes Use to check blood sugar two times daily Laurey Morale, MD Taking Active   ?diclofenac (VOLTAREN) 75 MG EC tablet [id number] Yes Take 1 tablet (75 mg total) by mouth 2 (two) times daily. Laurey Morale, MD Taking Active   ?empagliflozin (JARDIANCE) 25 MG TABS tablet [id number] Yes Take 1 tablet (25 mg total) by mouth daily before breakfast. Laurey Morale, MD Taking Active   ?furosemide (LASIX) 20 MG tablet [id number] Yes Take 1 tablet by mouth once daily Laurey Morale, MD Taking Active   ?glipiZIDE (GLUCOTROL) 10 MG tablet [id number] Yes Take 1 tablet (10 mg total) by mouth 2 (two) times daily before a meal. Laurey Morale, MD Taking Active   ?Lancets [facility] ULTRASOFT) lancets [id number] Yes USE   TO CHECK GLUCOSE ONCE DAILY Laurey Morale, MD Taking Active   ?lisinopril (ZESTRIL) 10 MG tablet [id number] Yes Take 2 tablets (20 mg total) by mouth daily. Laurey Morale, MD Taking Active   ?metFORMIN (GLUCOPHAGE) 500 MG tablet [id number] Yes Take 1 tablet (500 mg total) by mouth 2 (two) times daily with a meal. Laurey Morale, MD Taking Active   ?metoprolol succinate (TOPROL-XL)  50 MG 24 hr tablet [id number] Yes TAKE 1 TABLET BY MOUTH ONCE DAILY WITH OR  IMMEDIATELY  FOLLOWING  A  MEAL Laurey Morale, MD Taking Active   ?[facility] VERIO test strip [id number] Yes USE 1 STRIP TO CHECK GLUCOSE ONCE DAILY Laurey Morale, MD Taking Active   ?risedronate (ACTONEL) 5 MG tablet [id number] Yes Take 5 mg by mouth every morning. [provider] Taking Active   ?rosuvastatin (CRESTOR) 40 MG tablet [id number] Yes Take 1 tablet (40 mg total) by mouth daily. Laurey Morale, MD Taking Active   ?spironolactone  (ALDACTONE) 25 MG tablet [id number] Yes Take 1 tablet by mouth once daily Laurey Morale, MD Taking Active   ? ?  ?  ? ?  ? ? ? ?SDOH:  (Social Determinants of Health) assessments and interventions performed: SDOH assessments completed today and documented in the Epic system. ? ? ?Care Plan ? ?Review of patient past medical history, allergies, medications, health status, including review of consultants reports, laboratory and other test data, was performed as part of comprehensive evaluation for care management services.  ? ?Care Plan : RN Care Manager Plan of Care  ?Updates made by Michiel Cowboy, RN since 03/05/2022 12:00 AM  ?  ? ?Problem: Knowledge Deficit Related to Diabetes and Hypertension   ?Priority: High  ?  ? ?Long-Range Goal: Development of Plan of Care for Diabetes and Hypertension   ?Start Date: 11/14/2021  ?Expected End Date: 11/24/2022  ?Priority: High  ?Note:   ?Current Barriers:  ?Knowledge Deficits related to plan of care for management of HTN and DMII  ? ?RNCM Clinical Goal(s):  ?Patient will take all medications exactly as prescribed and will call provider for medication related questions as evidenced by patient's verbalization that she is taking her medications as prescribed and contacts her providers for questions and concerns ?demonstrate Improved adherence to prescribed treatment plan for HTN and DMII as evidenced by monitoring her blood sugar daily and B/P 3 times a week and contacting her provider for concerning values; adhering to a low sodium and diabetic diet; decreasing A1c to below 7 and maintaining B/P < 140/90 ?continue to work with RN Care Manager to address care management and care coordination needs related to  HTN and DMII as evidenced by adherence to CM Team Scheduled appointments through collaboration with RN Care manager, provider, and care team.  ? ?Interventions: ?Inter-disciplinary care team collaboration (see longitudinal plan of care) ?Evaluation of current treatment plan  related to  self management and patient's adherence to plan as established by provider ? ? ? ?Diabetes Interventions:  (Status:  Goal on track:  Yes.) Long Term Goal ?Assessed patient's understanding of A1c goal: <7% ?Provided education to patient about basic DM disease process ?Reviewed medications with patient and discussed importance of medication adherence ?Counseled on importance of regular laboratory monitoring as prescribed ?Discussed plans with patient for ongoing care management follow up and provided patient with direct contact information for care management team ?Provided patient with written educational materials related to hypo and hyperglycemia and importance of correct treatment ?Advised patient, providing education and rationale, to check cbg 1-2 and record, calling PCP for findings outside established parameters ?Assessed social determinant of health barriers ?Encouraged patient not to sit for long periods and do chair exercises while watching TV ?Encouraged patient to limit sugar and carbohydrates in her diet ? ?Lab Results  ?Component Value Date  ? HGBA1C 7.9 (H) 02/06/2022  ? ?Hypertension Interventions:  (Status:  Goal  on track:  Yes. and Goal on track:  NO.) Long Term Goal ?Last practice recorded BP readings:  ?BP Readings from Last 3 Encounters:  ?02/06/22 140/80  ?07/21/21 (!) 142/86  ?07/26/20 (!) 166/78  ?Most recent eGFR/CrCl: No results found for: EGFR  No components found for: CRCL ? ?Evaluation of current treatment plan related to hypertension self management and patient's adherence to plan as established by provider ?Provided education to patient re: stroke prevention, s/s of heart attack and stroke ?Reviewed medications with patient and discussed importance of compliance ?Discussed plans with patient for ongoing care management follow up and provided patient with direct contact information for care management team ?Discussed complications of poorly controlled blood pressure such as  heart disease, stroke, circulatory complications, vision complications, kidney impairment, sexual dysfunction ?Assessed social determinant of health barriers ?Encouraged patient not to sit for long peri

## 2022-03-05 NOTE — Patient Instructions (Addendum)
Visit Information ? ?Thank you for taking time to visit with me today. Please don't hesitate to contact me if I can be of assistance to you before our next scheduled telephone appointment. ? ?Following are the goals we discussed today:  ? Patient Goals/Self-Care Activities: ?Take all medications as prescribed ?Attend all scheduled provider appointments ?Call pharmacy for medication refills 3-7 days in advance of running out of medications ?keep appointment with eye doctor ?check blood sugar at prescribed times: once daily ?check feet daily for cuts, sores or redness ?enter blood sugar readings and medication or insulin into daily log ?take the blood sugar log to all doctor visits ?set goal weight ?trim toenails straight across ?drink 6 to 8 glasses of water each day ?fill half of plate with vegetables ?manage portion size ?wash and dry feet carefully every day ?wear comfortable, well-fitting shoes ?check blood pressure 3 times per week ?write blood pressure results in a log or diary ?take blood pressure log to all doctor appointments ?call doctor for signs and symptoms of high blood pressure ?keep all doctor appointments ?take medications for blood pressure exactly as prescribed ?eat more whole grains, fruits and vegetables, lean meats and healthy fats ?Limit sodium, sugar, and carbohydrates in diet ?Do not sit for long periods and do chair exercises while watching TV ? ? ?Patient verbalizes understanding of instructions and care plan provided today and agrees to view in [place]. Active MyChart status confirmed with patient.   ? ?Telephone follow up appointment with care management team member scheduled for: June ? ?Emelia Loron RN, BSN ?Nurse Health Coach ?[place] ?[phone] ?[email or web address] ? ? ?  ?

## 2022-03-13 ENCOUNTER — Other Ambulatory Visit: Payer: Self-pay | Admitting: Family Medicine

## 2022-03-18 ENCOUNTER — Other Ambulatory Visit: Payer: Self-pay | Admitting: Family Medicine

## 2022-03-30 ENCOUNTER — Telehealth: Payer: Self-pay

## 2022-03-30 NOTE — Telephone Encounter (Signed)
Please advise 

## 2022-03-30 NOTE — Telephone Encounter (Signed)
Yes I remember signing this  ?

## 2022-03-30 NOTE — Telephone Encounter (Signed)
The forms have been faxed several times but the faxes provided keep giving confirmation busy, spoke with an agent of the [employer] who provided a different fax number, agent confirmed that fax was received ?

## 2022-03-30 NOTE — Telephone Encounter (Signed)
[name] from [employer] called in to see if order had been signed for diabetic footwear and would like a call back at [phone] ?

## 2022-04-06 ENCOUNTER — Other Ambulatory Visit: Payer: Self-pay | Admitting: Family Medicine

## 2022-04-11 DIAGNOSIS — E1140 Type 2 diabetes mellitus with diabetic neuropathy, unspecified: Secondary | ICD-10-CM | POA: Diagnosis not present

## 2022-05-14 ENCOUNTER — Other Ambulatory Visit (INDEPENDENT_AMBULATORY_CARE_PROVIDER_SITE_OTHER): Payer: Medicare Other

## 2022-05-14 DIAGNOSIS — E1140 Type 2 diabetes mellitus with diabetic neuropathy, unspecified: Secondary | ICD-10-CM | POA: Diagnosis not present

## 2022-05-14 LAB — HEMOGLOBIN A1C: Hgb A1c MFr Bld: 8.3 % — ABNORMAL HIGH (ref 4.6–6.5)

## 2022-05-14 NOTE — Addendum Note (Signed)
Addended by: Octavio Manns E on: 05/14/2022 10:05 AM   Modules accepted: Orders

## 2022-05-20 ENCOUNTER — Encounter (HOSPITAL_COMMUNITY): Payer: Self-pay

## 2022-05-20 ENCOUNTER — Emergency Department (HOSPITAL_COMMUNITY): Payer: Medicare Other

## 2022-05-20 ENCOUNTER — Inpatient Hospital Stay (HOSPITAL_COMMUNITY)
Admission: EM | Admit: 2022-05-20 | Discharge: 2022-05-22 | DRG: 871 | Disposition: A | Discharge status: Home Health | Payer: Medicare Other | Attending: Internal Medicine | Admitting: Internal Medicine

## 2022-05-20 ENCOUNTER — Other Ambulatory Visit: Payer: Self-pay

## 2022-05-20 DIAGNOSIS — Z96653 Presence of artificial knee joint, bilateral: Secondary | ICD-10-CM | POA: Diagnosis present

## 2022-05-20 DIAGNOSIS — N2889 Other specified disorders of kidney and ureter: Secondary | ICD-10-CM | POA: Diagnosis not present

## 2022-05-20 DIAGNOSIS — F17210 Nicotine dependence, cigarettes, uncomplicated: Secondary | ICD-10-CM | POA: Diagnosis not present

## 2022-05-20 DIAGNOSIS — J690 Pneumonitis due to inhalation of food and vomit: Secondary | ICD-10-CM | POA: Diagnosis not present

## 2022-05-20 DIAGNOSIS — R1310 Dysphagia, unspecified: Secondary | ICD-10-CM | POA: Diagnosis present

## 2022-05-20 DIAGNOSIS — R6520 Severe sepsis without septic shock: Secondary | ICD-10-CM | POA: Diagnosis not present

## 2022-05-20 DIAGNOSIS — Z8673 Personal history of transient ischemic attack (TIA), and cerebral infarction without residual deficits: Secondary | ICD-10-CM | POA: Diagnosis not present

## 2022-05-20 DIAGNOSIS — N179 Acute kidney failure, unspecified: Secondary | ICD-10-CM | POA: Diagnosis present

## 2022-05-20 DIAGNOSIS — Z79899 Other long term (current) drug therapy: Secondary | ICD-10-CM

## 2022-05-20 DIAGNOSIS — Z20822 Contact with and (suspected) exposure to covid-19: Secondary | ICD-10-CM | POA: Diagnosis present

## 2022-05-20 DIAGNOSIS — I872 Venous insufficiency (chronic) (peripheral): Secondary | ICD-10-CM | POA: Diagnosis present

## 2022-05-20 DIAGNOSIS — A419 Sepsis, unspecified organism: Secondary | ICD-10-CM | POA: Diagnosis not present

## 2022-05-20 DIAGNOSIS — J851 Abscess of lung with pneumonia: Secondary | ICD-10-CM | POA: Diagnosis present

## 2022-05-20 DIAGNOSIS — Z885 Allergy status to narcotic agent status: Secondary | ICD-10-CM

## 2022-05-20 DIAGNOSIS — R918 Other nonspecific abnormal finding of lung field: Principal | ICD-10-CM

## 2022-05-20 DIAGNOSIS — J9811 Atelectasis: Secondary | ICD-10-CM | POA: Diagnosis not present

## 2022-05-20 DIAGNOSIS — C3411 Malignant neoplasm of upper lobe, right bronchus or lung: Secondary | ICD-10-CM | POA: Diagnosis present

## 2022-05-20 DIAGNOSIS — R600 Localized edema: Secondary | ICD-10-CM

## 2022-05-20 DIAGNOSIS — E119 Type 2 diabetes mellitus without complications: Secondary | ICD-10-CM

## 2022-05-20 DIAGNOSIS — R911 Solitary pulmonary nodule: Secondary | ICD-10-CM | POA: Diagnosis present

## 2022-05-20 DIAGNOSIS — J189 Pneumonia, unspecified organism: Secondary | ICD-10-CM

## 2022-05-20 DIAGNOSIS — Z7984 Long term (current) use of oral hypoglycemic drugs: Secondary | ICD-10-CM

## 2022-05-20 DIAGNOSIS — Z6841 Body Mass Index (BMI) 40.0 and over, adult: Secondary | ICD-10-CM | POA: Diagnosis not present

## 2022-05-20 DIAGNOSIS — E860 Dehydration: Secondary | ICD-10-CM | POA: Diagnosis not present

## 2022-05-20 DIAGNOSIS — J984 Other disorders of lung: Secondary | ICD-10-CM | POA: Diagnosis not present

## 2022-05-20 DIAGNOSIS — E8809 Other disorders of plasma-protein metabolism, not elsewhere classified: Secondary | ICD-10-CM | POA: Diagnosis not present

## 2022-05-20 DIAGNOSIS — R0689 Other abnormalities of breathing: Secondary | ICD-10-CM | POA: Diagnosis not present

## 2022-05-20 DIAGNOSIS — J9601 Acute respiratory failure with hypoxia: Secondary | ICD-10-CM | POA: Diagnosis present

## 2022-05-20 DIAGNOSIS — R609 Edema, unspecified: Secondary | ICD-10-CM | POA: Diagnosis not present

## 2022-05-20 DIAGNOSIS — Z888 Allergy status to other drugs, medicaments and biological substances status: Secondary | ICD-10-CM | POA: Diagnosis not present

## 2022-05-20 DIAGNOSIS — E782 Mixed hyperlipidemia: Secondary | ICD-10-CM | POA: Diagnosis present

## 2022-05-20 DIAGNOSIS — I10 Essential (primary) hypertension: Secondary | ICD-10-CM | POA: Diagnosis present

## 2022-05-20 DIAGNOSIS — K5730 Diverticulosis of large intestine without perforation or abscess without bleeding: Secondary | ICD-10-CM | POA: Diagnosis not present

## 2022-05-20 DIAGNOSIS — E1142 Type 2 diabetes mellitus with diabetic polyneuropathy: Secondary | ICD-10-CM | POA: Diagnosis present

## 2022-05-20 DIAGNOSIS — R7401 Elevation of levels of liver transaminase levels: Secondary | ICD-10-CM | POA: Diagnosis not present

## 2022-05-20 DIAGNOSIS — Z66 Do not resuscitate: Secondary | ICD-10-CM | POA: Diagnosis present

## 2022-05-20 DIAGNOSIS — R070 Pain in throat: Secondary | ICD-10-CM | POA: Diagnosis not present

## 2022-05-20 DIAGNOSIS — Z8249 Family history of ischemic heart disease and other diseases of the circulatory system: Secondary | ICD-10-CM

## 2022-05-20 DIAGNOSIS — R0902 Hypoxemia: Secondary | ICD-10-CM | POA: Diagnosis not present

## 2022-05-20 DIAGNOSIS — I700 Atherosclerosis of aorta: Secondary | ICD-10-CM | POA: Diagnosis not present

## 2022-05-20 DIAGNOSIS — Z7982 Long term (current) use of aspirin: Secondary | ICD-10-CM

## 2022-05-20 DIAGNOSIS — R509 Fever, unspecified: Secondary | ICD-10-CM | POA: Diagnosis not present

## 2022-05-20 DIAGNOSIS — Z833 Family history of diabetes mellitus: Secondary | ICD-10-CM

## 2022-05-20 DIAGNOSIS — N390 Urinary tract infection, site not specified: Secondary | ICD-10-CM

## 2022-05-20 LAB — URINALYSIS, ROUTINE W REFLEX MICROSCOPIC
Bilirubin Urine: NEGATIVE
Glucose, UA: >=500 mg/dL — AB
Hgb urine dipstick: NEGATIVE
Ketones, ur: NEGATIVE mg/dL
Nitrite: NEGATIVE
Protein, ur: NEGATIVE mg/dL
Specific Gravity, Urine: 1.015 (ref 1.005–1.030)
pH: 5 (ref 5.0–8.0)

## 2022-05-20 LAB — CBC WITH DIFFERENTIAL/PLATELET
Abs Immature Granulocytes: 0.18 10*3/uL — ABNORMAL HIGH (ref 0.00–0.07)
Basophils Absolute: 0 10*3/uL (ref 0.0–0.1)
Basophils Relative: 0 %
Eosinophils Absolute: 0.1 10*3/uL (ref 0.0–0.5)
Eosinophils Relative: 0 %
HCT: 38.1 % (ref 36.0–46.0)
Hemoglobin: 11.8 g/dL — ABNORMAL LOW (ref 12.0–15.0)
Immature Granulocytes: 1 %
Lymphocytes Relative: 11 %
Lymphs Abs: 2.2 10*3/uL (ref 0.7–4.0)
MCH: 30 pg (ref 26.0–34.0)
MCHC: 31 g/dL (ref 30.0–36.0)
MCV: 96.9 fL (ref 80.0–100.0)
Monocytes Absolute: 1.5 10*3/uL — ABNORMAL HIGH (ref 0.1–1.0)
Monocytes Relative: 8 %
Neutro Abs: 16.1 10*3/uL — ABNORMAL HIGH (ref 1.7–7.7)
Neutrophils Relative %: 80 %
Platelets: 264 10*3/uL (ref 150–400)
RBC: 3.93 MIL/uL (ref 3.87–5.11)
RDW: 15.1 % (ref 11.5–15.5)
WBC: 20.1 10*3/uL — ABNORMAL HIGH (ref 4.0–10.5)
nRBC: 0 % (ref 0.0–0.2)

## 2022-05-20 LAB — PROTIME-INR
INR: 1.2 (ref 0.8–1.2)
Prothrombin Time: 15.3 seconds — ABNORMAL HIGH (ref 11.4–15.2)

## 2022-05-20 LAB — COMPREHENSIVE METABOLIC PANEL
ALT: 65 U/L — ABNORMAL HIGH (ref 0–44)
AST: 47 U/L — ABNORMAL HIGH (ref 15–41)
Albumin: 2.8 g/dL — ABNORMAL LOW (ref 3.5–5.0)
Alkaline Phosphatase: 65 U/L (ref 38–126)
Anion gap: 10 (ref 5–15)
BUN: 36 mg/dL — ABNORMAL HIGH (ref 8–23)
CO2: 23 mmol/L (ref 22–32)
Calcium: 10.1 mg/dL (ref 8.9–10.3)
Chloride: 104 mmol/L (ref 98–111)
Creatinine, Ser: 1.91 mg/dL — ABNORMAL HIGH (ref 0.44–1.00)
GFR, Estimated: 27 mL/min — ABNORMAL LOW (ref >60)
Glucose, Bld: 190 mg/dL — ABNORMAL HIGH (ref 70–99)
Potassium: 4.8 mmol/L (ref 3.5–5.1)
Sodium: 137 mmol/L (ref 135–145)
Total Bilirubin: 0.6 mg/dL (ref 0.3–1.2)
Total Protein: 8 g/dL (ref 6.5–8.1)

## 2022-05-20 LAB — APTT: aPTT: 33 seconds (ref 24–36)

## 2022-05-20 LAB — LACTIC ACID, PLASMA: Lactic Acid, Venous: 1.8 mmol/L (ref 0.5–1.9)

## 2022-05-20 LAB — SARS CORONAVIRUS 2 BY RT PCR: SARS Coronavirus 2 by RT PCR: NEGATIVE

## 2022-05-20 LAB — GLUCOSE, CAPILLARY: Glucose-Capillary: 124 mg/dL — ABNORMAL HIGH (ref 70–99)

## 2022-05-20 MED ORDER — ACETAMINOPHEN 325 MG PO TABS
650.0000 mg | ORAL_TABLET | Freq: Four times a day (QID) | ORAL | Status: DC | PRN
  Administered 2022-05-21 – 2022-05-22 (×2): 650 mg via ORAL
  Filled 2022-05-20 (×2): qty 2

## 2022-05-20 MED ORDER — LACTATED RINGERS IV BOLUS (SEPSIS)
1000.0000 mL | Freq: Once | INTRAVENOUS | Status: AC
  Administered 2022-05-20: 1000 mL via INTRAVENOUS

## 2022-05-20 MED ORDER — METRONIDAZOLE 500 MG/100ML IV SOLN
500.0000 mg | Freq: Once | INTRAVENOUS | Status: AC
  Administered 2022-05-20: 500 mg via INTRAVENOUS
  Filled 2022-05-20: qty 100

## 2022-05-20 MED ORDER — METRONIDAZOLE 500 MG/100ML IV SOLN
500.0000 mg | Freq: Two times a day (BID) | INTRAVENOUS | Status: DC
  Administered 2022-05-21: 500 mg via INTRAVENOUS
  Filled 2022-05-20: qty 100

## 2022-05-20 MED ORDER — VANCOMYCIN HCL 1250 MG/250ML IV SOLN: 1250.0000 mg | INTRAVENOUS | Status: DC

## 2022-05-20 MED ORDER — ROSUVASTATIN CALCIUM 20 MG PO TABS
40.0000 mg | ORAL_TABLET | Freq: Every day | ORAL | Status: DC
  Administered 2022-05-21 – 2022-05-22 (×2): 40 mg via ORAL
  Filled 2022-05-20 (×2): qty 2

## 2022-05-20 MED ORDER — LACTATED RINGERS IV SOLN: INTRAVENOUS | Status: AC

## 2022-05-20 MED ORDER — INSULIN ASPART 100 UNIT/ML IJ SOLN: 0.0000 [IU] | Freq: Every day | INTRAMUSCULAR | Status: DC

## 2022-05-20 MED ORDER — ACETAMINOPHEN 650 MG RE SUPP: 650.0000 mg | Freq: Four times a day (QID) | RECTAL | Status: DC | PRN

## 2022-05-20 MED ORDER — HEPARIN SODIUM (PORCINE) 5000 UNIT/ML IJ SOLN
5000.0000 [IU] | Freq: Three times a day (TID) | INTRAMUSCULAR | Status: DC
Start: 2022-05-20 — End: 2022-05-22
  Administered 2022-05-20 – 2022-05-22 (×5): 5000 [IU] via SUBCUTANEOUS
  Filled 2022-05-20 (×5): qty 1

## 2022-05-20 MED ORDER — SODIUM CHLORIDE 0.9 % IV SOLN
2.0000 g | Freq: Once | INTRAVENOUS | Status: AC
  Administered 2022-05-20: 2 g via INTRAVENOUS
  Filled 2022-05-20: qty 12.5

## 2022-05-20 MED ORDER — VANCOMYCIN HCL IN DEXTROSE 1-5 GM/200ML-% IV SOLN: 1000.0000 mg | Freq: Once | INTRAVENOUS | Status: DC

## 2022-05-20 MED ORDER — ACETAMINOPHEN 500 MG PO TABS
1000.0000 mg | ORAL_TABLET | Freq: Once | ORAL | Status: AC
  Administered 2022-05-20: 1000 mg via ORAL
  Filled 2022-05-20: qty 2

## 2022-05-20 MED ORDER — LACTATED RINGERS IV BOLUS (SEPSIS)
500.0000 mL | Freq: Once | INTRAVENOUS | Status: AC
  Administered 2022-05-20: 500 mL via INTRAVENOUS

## 2022-05-20 MED ORDER — VANCOMYCIN HCL 1750 MG/350ML IV SOLN
1750.0000 mg | Freq: Once | INTRAVENOUS | Status: AC
  Administered 2022-05-20: 1750 mg via INTRAVENOUS
  Filled 2022-05-20 (×2): qty 350

## 2022-05-20 MED ORDER — SODIUM CHLORIDE 0.9 % IV SOLN
2.0000 g | Freq: Two times a day (BID) | INTRAVENOUS | Status: DC
  Administered 2022-05-21: 2 g via INTRAVENOUS
  Filled 2022-05-20: qty 12.5

## 2022-05-20 MED ORDER — INSULIN ASPART 100 UNIT/ML IJ SOLN: 0.0000 [IU] | Freq: Three times a day (TID) | INTRAMUSCULAR | Status: DC

## 2022-05-21 DIAGNOSIS — J984 Other disorders of lung: Secondary | ICD-10-CM | POA: Diagnosis not present

## 2022-05-21 DIAGNOSIS — J189 Pneumonia, unspecified organism: Secondary | ICD-10-CM

## 2022-05-21 LAB — GLUCOSE, CAPILLARY
Glucose-Capillary: 120 mg/dL — ABNORMAL HIGH (ref 70–99)
Glucose-Capillary: 100 mg/dL — ABNORMAL HIGH (ref 70–99)
Glucose-Capillary: 112 mg/dL — ABNORMAL HIGH (ref 70–99)
Glucose-Capillary: 130 mg/dL — ABNORMAL HIGH (ref 70–99)

## 2022-05-21 LAB — RESPIRATORY PANEL BY PCR

## 2022-05-21 LAB — COMPREHENSIVE METABOLIC PANEL
ALT: 55 U/L — ABNORMAL HIGH (ref 0–44)
AST: 35 U/L (ref 15–41)
Albumin: 2.3 g/dL — ABNORMAL LOW (ref 3.5–5.0)
Alkaline Phosphatase: 56 U/L (ref 38–126)
Anion gap: 11 (ref 5–15)
BUN: 26 mg/dL — ABNORMAL HIGH (ref 8–23)
CO2: 20 mmol/L — ABNORMAL LOW (ref 22–32)
Calcium: 9.6 mg/dL (ref 8.9–10.3)
Chloride: 107 mmol/L (ref 98–111)
Creatinine, Ser: 1.47 mg/dL — ABNORMAL HIGH (ref 0.44–1.00)
GFR, Estimated: 37 mL/min — ABNORMAL LOW (ref >60)
Glucose, Bld: 122 mg/dL — ABNORMAL HIGH (ref 70–99)
Potassium: 4.7 mmol/L (ref 3.5–5.1)
Sodium: 138 mmol/L (ref 135–145)
Total Bilirubin: 0.8 mg/dL (ref 0.3–1.2)
Total Protein: 6.8 g/dL (ref 6.5–8.1)

## 2022-05-21 LAB — STREP PNEUMONIAE URINARY ANTIGEN: Strep Pneumo Urinary Antigen: NEGATIVE

## 2022-05-21 LAB — URINE CULTURE: Culture: <10000 — AB

## 2022-05-21 LAB — CBC
HCT: 24.1 % — ABNORMAL LOW (ref 36.0–46.0)
HCT: 32.5 % — ABNORMAL LOW (ref 36.0–46.0)
Hemoglobin: 7.7 g/dL — ABNORMAL LOW (ref 12.0–15.0)
Hemoglobin: 10.4 g/dL — ABNORMAL LOW (ref 12.0–15.0)
MCH: 31.3 pg (ref 26.0–34.0)
MCH: 30.7 pg (ref 26.0–34.0)
MCHC: 32 g/dL (ref 30.0–36.0)
MCHC: 32 g/dL (ref 30.0–36.0)
MCV: 98 fL (ref 80.0–100.0)
MCV: 95.9 fL (ref 80.0–100.0)
Platelets: 266 10*3/uL (ref 150–400)
Platelets: 225 10*3/uL (ref 150–400)
RBC: 2.46 MIL/uL — ABNORMAL LOW (ref 3.87–5.11)
RBC: 3.39 MIL/uL — ABNORMAL LOW (ref 3.87–5.11)
RDW: 15.2 % (ref 11.5–15.5)
RDW: 15.2 % (ref 11.5–15.5)
WBC: 23.9 10*3/uL — ABNORMAL HIGH (ref 4.0–10.5)
WBC: 19.2 10*3/uL — ABNORMAL HIGH (ref 4.0–10.5)
nRBC: 0 % (ref 0.0–0.2)
nRBC: 0 % (ref 0.0–0.2)

## 2022-05-21 LAB — LACTIC ACID, PLASMA: Lactic Acid, Venous: 1 mmol/L (ref 0.5–1.9)

## 2022-05-21 LAB — BRAIN NATRIURETIC PEPTIDE: B Natriuretic Peptide: 252 pg/mL — ABNORMAL HIGH (ref 0.0–100.0)

## 2022-05-21 LAB — RESP PANEL BY RT-PCR (FLU A&B, COVID) ARPGX2
Influenza A by PCR: NEGATIVE
Influenza B by PCR: NEGATIVE
SARS Coronavirus 2 by RT PCR: NEGATIVE

## 2022-05-21 LAB — MRSA NEXT GEN BY PCR, NASAL: MRSA by PCR Next Gen: NOT DETECTED

## 2022-05-21 LAB — VITAMIN D 25 HYDROXY (VIT D DEFICIENCY, FRACTURES): Vit D, 25-Hydroxy: 41.2 ng/mL (ref 30–100)

## 2022-05-21 MED ORDER — AMOXICILLIN-POT CLAVULANATE 875-125 MG PO TABS
1.0000 | ORAL_TABLET | Freq: Two times a day (BID) | ORAL | Status: DC
  Administered 2022-05-21 – 2022-05-22 (×2): 1 via ORAL
  Filled 2022-05-21 (×3): qty 1

## 2022-05-21 MED ORDER — SENNOSIDES-DOCUSATE SODIUM 8.6-50 MG PO TABS: 1.0000 | ORAL_TABLET | Freq: Every evening | ORAL | Status: DC | PRN

## 2022-05-21 MED ORDER — HYDRALAZINE HCL 20 MG/ML IJ SOLN: 10.0000 mg | INTRAMUSCULAR | Status: DC | PRN

## 2022-05-21 MED ORDER — IPRATROPIUM-ALBUTEROL 20-100 MCG/ACT IN AERS
1.0000 | INHALATION_SPRAY | Freq: Four times a day (QID) | RESPIRATORY_TRACT | Status: DC
  Filled 2022-05-21: qty 4

## 2022-05-21 MED ORDER — TRAZODONE HCL 50 MG PO TABS: 50.0000 mg | ORAL_TABLET | Freq: Every evening | ORAL | Status: DC | PRN

## 2022-05-21 NOTE — Consult Note (Signed)
NAME:  [name], MRN:  [id number], DOB:  [birth year], LOS: 1 ADMISSION DATE:  05/20/2022, CONSULTATION DATE:  05/21/2022 REFERRING MD:  Dr. Shari Heritage, [facility], CHIEF COMPLAINT:  abnormal xray   History of Present Illness:  76 yo female with remote history of cigarette smoking had trouble swallowing food about 2 weeks prior to admission.  Over the past several days she had cough, sore throat and felt feverish.  She felt her neck glands were swollen and felt weak.  Rectal temperature in ER was 103.7.  Chest xray and CT chest showed Rt upper lung cavitary lesion.  No recent prior imaging studies for comparison.  She was admitted to hospital and started on ABx.  PCCM consulted to assess whether she needs bronchoscopy.  Pertinent  Medical History  Arthritis, Cataract, DM type 2 with peripheral neuropathy, CVA, HLD, HTN  Significant Hospital Events: Including procedures, antibiotic start and stop dates in addition to other pertinent events   6/25 Admit, start Abx  Studies:  CT chest/abd/pelvis 6/25 >> 3.9 x 2.8 x 3.8 posterior RUL thick walled cavitary lesion, 7 mm nodule RUL, diverticulosis, atherosclerosis  Interim History / Subjective:  She denies hx of TB.  She wears dentures.  She has trouble swallowing certain foods.  Denies reflux, chest pain, hemoptysis.  She was getting sweats at night for a few days prior to admission, but feels better since starting medication in hospital.    Objective   Blood pressure 131/62, pulse 86, temperature (!) 101.9 F (38.8 C), temperature source Oral, resp. rate 20, height 5\' 5"  (1.651 m), weight 116.1 kg, SpO2 (!) 87 %.        Intake/Output Summary (Last 24 hours) at 05/21/2022 1034 Last data filed at 05/21/2022 1000 Gross per 24 hour  Intake 2092.68 ml  Output 1050 ml  Net 1042.68 ml   Filed Weights   05/20/22 1623  Weight: 116.1 kg    Examination:  General - alert, pleasant Eyes - pupils reactive ENT - no sinus tenderness, no stridor,  edentulous Cardiac - regular rate/rhythm, no murmur Chest - equal breath sounds b/l, no wheezing or rales Abdomen - soft, non tender, + bowel sounds Extremities - no cyanosis, clubbing, or edema Skin - no rashes Neuro - normal strength, moves extremities, follows commands Psych - normal mood and behavior Lymphatics - no tenderness in neck region   Assessment & Plan:   Acute febrile illness with Rt upper lobe cavitary lesion with recent difficulties swallowing food. - likely has aspiration pneumonitis causing cavitary infiltrate - malignancy and atypical infection are also possibilities, but seem less likely - has favorable initial clinical response to current antibiotics - f/u 2 view chest xray on 05/22/22 - speech therapy assessment pendings - f/u Quantiferon gold, Pneumococcal/Legionalla Ag, Respiratory viral panel from 05/21/22 - defer bronchoscopy for now  Goals of care. - DNR/DNI  Renal insufficiency. DM type 2 with peripheral neuropathy. HLD. HTN. - per primary team  D/w Dr. Shari Heritage  Labs   CBC: Recent Labs  Lab 05/20/22 1615 05/21/22 0725 05/21/22 0902  WBC 20.1* 23.9* 19.2*  NEUTROABS 16.1*  --   --   HGB 11.8* 7.7* 10.4*  HCT 38.1 24.1* 32.5*  MCV 96.9 98.0 95.9  PLT 264 266 225    Basic Metabolic Panel: Recent Labs  Lab 05/20/22 1615 05/21/22 0725  NA 137 138  K 4.8 4.7  CL 104 107  CO2 23 20*  GLUCOSE 190* 122*  BUN 36* 26*  CREATININE 1.91*  1.47*  CALCIUM 10.1 9.6   GFR: Estimated Creatinine Clearance: 42.1 mL/min (A) (by C-G formula based on SCr of 1.47 mg/dL (H)). Recent Labs  Lab 05/20/22 1615 05/21/22 0725 05/21/22 0902  WBC 20.1* 23.9* 19.2*  LATICACIDVEN 1.8 1.0  --     Liver Function Tests: Recent Labs  Lab 05/20/22 1615 05/21/22 0725  AST 47* 35  ALT 65* 55*  ALKPHOS 65 56  BILITOT 0.6 0.8  PROT 8.0 6.8  ALBUMIN 2.8* 2.3*   No results for input(s): "LIPASE", "AMYLASE" in the last 168 hours. No results for input(s):  "AMMONIA" in the last 168 hours.  ABG No results found for: "PHART", "PCO2ART", "PO2ART", "HCO3", "TCO2", "ACIDBASEDEF", "O2SAT"   Coagulation Profile: Recent Labs  Lab 05/20/22 1615  INR 1.2    Cardiac Enzymes: No results for input(s): "CKTOTAL", "CKMB", "CKMBINDEX", "TROPONINI" in the last 168 hours.  HbA1C: Hgb A1c MFr Bld  Date/Time Value Ref Range Status  05/14/2022 10:05 AM 8.3 (H) 4.6 - 6.5 % Final    Comment:    Glycemic Control Guidelines for People with Diabetes:Non Diabetic:  <6%Goal of Therapy: <7%Additional Action Suggested:  >8%   02/06/2022 11:42 AM 7.9 (H) 4.6 - 6.5 % Final    Comment:    Glycemic Control Guidelines for People with Diabetes:Non Diabetic:  <6%Goal of Therapy: <7%Additional Action Suggested:  >8%     CBG: Recent Labs  Lab 05/20/22 2228 05/21/22 0937  GLUCAP 124* 120*    Review of Systems:   C/o burning in her feet.  Otherwise negative.  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 Dorma Russell     Social History:   reports that she has been smoking cigarettes. She started smoking about 53 years ago. She has a 24.00 pack-year smoking history. She has never used smokeless tobacco. She reports that she does not currently use alcohol. She reports that she does not use drugs.   Family History:  Her family history includes Heart disease in her father; Heart failure in her mother. There is no history of Diabetes, Colon cancer, Esophageal cancer, Rectal cancer, or Stomach  cancer.   Allergies Allergies  Allergen Reactions   Actonel [Risedronate] Other (See Comments)    It made her "feel like I'm going backwards"    Hydromet [Hydrocodone Bit-Homatrop Mbr] Itching     Home Medications  Prior to Admission medications   Medication Sig Start Date End Date Taking? Authorizing Provider  amLODipine (NORVASC) 5 MG tablet Take 1 tablet by mouth once daily Patient taking differently: Take 5 mg by mouth daily. 03/19/22  Yes Nelwyn Salisbury, MD  aspirin 81 MG chewable tablet Chew 1 tablet (81 mg total) by mouth daily. 06/28/19  Yes Shahmehdi, Gemma Payor, MD  diclofenac (VOLTAREN) 75 MG EC tablet Take 1 tablet (75 mg total) by mouth 2 (two) times daily. 02/06/22  Yes Nelwyn Salisbury, MD  empagliflozin (JARDIANCE) 25 MG TABS tablet Take 1 tablet (25 mg total) by mouth daily before breakfast. 02/07/22  Yes Nelwyn Salisbury, MD  furosemide (LASIX) 20 MG tablet Take 1 tablet by mouth once daily Patient taking differently: Take 20 mg by mouth daily. 04/06/22  Yes Nelwyn Salisbury, MD  glipiZIDE (GLUCOTROL) 10 MG tablet Take 1 tablet (10 mg total) by mouth 2 (two) times daily before a meal. 07/21/21  Yes Nelwyn Salisbury, MD  lisinopril (ZESTRIL) 10 MG tablet Take 2 tablets by mouth once daily Patient taking differently: Take 20 mg by mouth daily. 04/06/22  Yes Nelwyn Salisbury, MD  metFORMIN (GLUCOPHAGE) 500 MG tablet Take 1 tablet (500 mg total) by mouth 2 (two) times daily with a meal. 02/06/22  Yes Nelwyn Salisbury, MD  metoprolol succinate (TOPROL-XL) 50 MG 24 hr tablet TAKE ONE TABLET BY MOUTH WITH OR IMMEDIATELY FOLLOWING A MEAL Patient taking differently: Take 50 mg by mouth daily. 04/06/22  Yes Nelwyn Salisbury, MD  rosuvastatin (CRESTOR) 40 MG tablet Take 1 tablet (40 mg total) by mouth daily. 02/06/22  Yes Nelwyn Salisbury, MD  spironolactone (ALDACTONE) 25 MG tablet Take 1 tablet by mouth once daily Patient taking differently: Take 25 mg by mouth daily. 04/06/22  Yes Nelwyn Salisbury, MD  Alcohol  Swabs (ALCOHOL PREP) PADS Use to test 1 X day and diagnosis code is E 11.9 02/20/17   Nelwyn Salisbury, MD  Blood Glucose Monitoring Suppl [facility] VERIO) w/Device KIT Use to check blood sugar two times daily 08/15/21   Nelwyn Salisbury, MD  Lancets [facility] ULTRASOFT) lancets USE   TO CHECK GLUCOSE ONCE DAILY 03/14/20   Nelwyn Salisbury, MD  [facility] VERIO test strip USE 1 STRIP TO CHECK GLUCOSE ONCE DAILY 03/01/22   Nelwyn Salisbury, MD     Signature:  Coralyn Helling, MD [place] [facility] Pager - [phone] 05/21/2022, 10:50 AM

## 2022-05-22 ENCOUNTER — Telehealth: Payer: Self-pay | Admitting: Pulmonary Disease

## 2022-05-22 ENCOUNTER — Inpatient Hospital Stay (HOSPITAL_COMMUNITY): Payer: Medicare Other

## 2022-05-22 DIAGNOSIS — J984 Other disorders of lung: Secondary | ICD-10-CM | POA: Diagnosis not present

## 2022-05-22 LAB — BASIC METABOLIC PANEL
Anion gap: 11 (ref 5–15)
BUN: 20 mg/dL (ref 8–23)
CO2: 18 mmol/L — ABNORMAL LOW (ref 22–32)
Calcium: 9.7 mg/dL (ref 8.9–10.3)
Chloride: 109 mmol/L (ref 98–111)
Creatinine, Ser: 1.42 mg/dL — ABNORMAL HIGH (ref 0.44–1.00)
GFR, Estimated: 39 mL/min — ABNORMAL LOW (ref >60)
Glucose, Bld: 121 mg/dL — ABNORMAL HIGH (ref 70–99)
Potassium: 4.4 mmol/L (ref 3.5–5.1)
Sodium: 138 mmol/L (ref 135–145)

## 2022-05-22 LAB — CBC
HCT: 35 % — ABNORMAL LOW (ref 36.0–46.0)
Hemoglobin: 10.6 g/dL — ABNORMAL LOW (ref 12.0–15.0)
MCH: 29.6 pg (ref 26.0–34.0)
MCHC: 30.3 g/dL (ref 30.0–36.0)
MCV: 97.8 fL (ref 80.0–100.0)
Platelets: 261 10*3/uL (ref 150–400)
RBC: 3.58 MIL/uL — ABNORMAL LOW (ref 3.87–5.11)
RDW: 15.2 % (ref 11.5–15.5)
WBC: 15.6 10*3/uL — ABNORMAL HIGH (ref 4.0–10.5)
nRBC: 0 % (ref 0.0–0.2)

## 2022-05-22 LAB — LEGIONELLA PNEUMOPHILA SEROGP 1 UR AG: L. pneumophila Serogp 1 Ur Ag: NEGATIVE

## 2022-05-22 LAB — GLUCOSE, CAPILLARY: Glucose-Capillary: 163 mg/dL — ABNORMAL HIGH (ref 70–99)

## 2022-05-22 LAB — PTH, INTACT AND CALCIUM
Calcium, Total (PTH): 9.3 mg/dL (ref 8.7–10.3)
PTH: 20 pg/mL (ref 15–65)

## 2022-05-22 LAB — MAGNESIUM: Magnesium: 2 mg/dL (ref 1.7–2.4)

## 2022-05-22 MED ORDER — ALBUTEROL SULFATE HFA 108 (90 BASE) MCG/ACT IN AERS: 2.0000 | INHALATION_SPRAY | Freq: Four times a day (QID) | RESPIRATORY_TRACT | 2 refills | Status: DC | PRN

## 2022-05-22 MED ORDER — IPRATROPIUM-ALBUTEROL 0.5-2.5 (3) MG/3ML IN SOLN
3.0000 mL | Freq: Three times a day (TID) | RESPIRATORY_TRACT | Status: DC
Start: 2022-05-22 — End: 2022-05-22
  Administered 2022-05-22: 3 mL via RESPIRATORY_TRACT
  Filled 2022-05-22 (×2): qty 3

## 2022-05-22 MED ORDER — AMOXICILLIN-POT CLAVULANATE 875-125 MG PO TABS: 1.0000 | ORAL_TABLET | Freq: Two times a day (BID) | ORAL | 0 refills | Status: AC

## 2022-05-22 MED ORDER — IPRATROPIUM-ALBUTEROL 0.5-2.5 (3) MG/3ML IN SOLN
3.0000 mL | Freq: Four times a day (QID) | RESPIRATORY_TRACT | Status: DC
  Administered 2022-05-22: 3 mL via RESPIRATORY_TRACT
  Filled 2022-05-22: qty 3

## 2022-05-22 NOTE — Evaluation (Signed)
Physical Therapy Evaluation Patient Details Name: [name] MRN: [id number] DOB: 1946-09-15 Today's Date: 05/22/2022  History of Present Illness  76 yo female admitted with sore throat, fever chills. 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  Pt is demonstrating some realistic awareness of obstacles, but is slow to respond to walker catching on the furniture in her room, as well as struggling to sequence stairs.  Follow acutely for these balance and safety challenges, and encourage her to have HHPT see her for follow up care.  Pt is interested, and has her son home to assist her.  POC has acute PT goals for now and then follow up with HHPT for balance, safety and LE strength to increase gait independence and reduce fall risk.       Recommendations for follow up therapy are one component of a multi-disciplinary discharge planning process, led by the attending physician.  Recommendations may be updated based on patient status, additional functional criteria and insurance authorization.  Follow Up Recommendations Home health PT      Assistance Recommended at Discharge Intermittent Supervision/Assistance  Patient can return home with the following  A little help with walking and/or transfers;A little help with bathing/dressing/bathroom;Assistance with cooking/housework;Assist for transportation;Help with stairs or ramp for entrance    Equipment Recommendations None recommended by PT (HHPT to assess platform device if pt will reconsider)  Recommendations for Other Services       Functional Status Assessment Patient has had a recent decline in their functional status and demonstrates the ability to make significant improvements in  function in a reasonable and predictable amount of time.     Precautions / Restrictions Precautions Precautions: Fall Restrictions Weight Bearing Restrictions: No      Mobility  Bed Mobility               General bed mobility comments: up in chair when PT arrived    Transfers Overall transfer level: Needs assistance Equipment used: Rolling walker (2 wheels) Transfers: Sit to/from Stand Sit to Stand: Mod assist, Min guard           General transfer comment: initially mod assist but min guard by end of session    Ambulation/Gait Ambulation/Gait assistance: Min guard Gait Distance (Feet): 110 Feet Assistive device: Rolling walker (2 wheels), 1 person hand held assist Gait Pattern/deviations: Step-through pattern, Decreased stride length, Trunk flexed (tends to try to lean on elbows) Gait velocity: reduced Gait velocity interpretation: <1.31 ft/sec, indicative of household ambulator Pre-gait activities: standing balance control General Gait Details: talked wtih pt about using perhaps a platform walker and she declined  Stairs Stairs: Yes Stairs assistance: Min assist Stair Management: Step to pattern, Forwards, Backwards, With walker Number of Stairs: 2 General stair comments: pt actually tried to lean elbows on walker to step up and did not succeed.  Talked with her about fall risk, will not use hands on walker grips for any task without being asked  Wheelchair Mobility    Modified Rankin (Stroke Patients Only)       Balance Overall balance assessment: Needs assistance Sitting-balance support: Feet supported Sitting balance-Leahy Scale: Good     Standing balance support: Bilateral upper extremity supported, During functional activity Standing balance-Leahy Scale: Poor Standing balance comment:  requires walker support for higher level balance challenges but will not consider a different walker to avoid leaning elbows on walker                              Pertinent Vitals/Pain Pain Assessment Pain Assessment: No/denies pain    Home Living Family/patient expects to be discharged to:: Private residence Living Arrangements: Children Available Help at Discharge: Family;Available PRN/intermittently Type of Home: House Home Access: Stairs to enter Entrance Stairs-Rails: Doctor, general practice of Steps: 6   Home Layout: One level Home Equipment: Agricultural consultant (2 wheels);BSC/3in1;Shower seat;Hand held shower head Additional Comments: pt has tendency to lean over walker or shopping cart with elbows to support despite the risk, tries to use that technique on step up taslk    Prior Function Prior Level of Function : Independent/Modified Independent;Driving                     Hand Dominance   Dominant Hand: Right    Extremity/Trunk Assessment   Upper Extremity Assessment Upper Extremity Assessment: Defer to OT evaluation    Lower Extremity Assessment Lower Extremity Assessment: Generalized weakness    Cervical / Trunk Assessment Cervical / Trunk Assessment: Kyphotic  Communication   Communication: No difficulties  Cognition Arousal/Alertness: Awake/alert Behavior During Therapy: WFL for tasks assessed/performed Overall Cognitive Status: Impaired/Different from baseline Area of Impairment: Safety/judgement, Awareness, Problem solving                         Safety/Judgement: Decreased awareness of safety, Decreased awareness of deficits Awareness: Intellectual, Emergent Problem Solving: Slow processing, Requires verbal cues General Comments: instructed pt to use walker with hands and discussed platform rollator        General Comments General comments (skin integrity, edema, etc.): pt is up to walk with help and practiced steps with single step in room, but is unsafe in using elbows on walker.  Agreed to have HHPT see her for follow up    Exercises     Assessment/Plan    PT  Assessment Patient needs continued PT services  PT Problem List Decreased strength;Decreased activity tolerance;Decreased balance;Decreased mobility;Decreased coordination;Decreased knowledge of use of DME;Decreased safety awareness       PT Treatment Interventions DME instruction;Gait training;Stair training;Functional mobility training;Therapeutic activities;Therapeutic exercise;Balance training;Neuromuscular re-education;Patient/family education    PT Goals (Current goals can be found in the Care Plan section)  Acute Rehab PT Goals Patient Stated Goal: to go home ASAP PT Goal Formulation: With patient Time For Goal Achievement: 05/29/22 Potential to Achieve Goals: Good    Frequency Min 3X/week     Co-evaluation               AM-PAC PT "6 Clicks" Mobility  Outcome Measure Help needed turning from your back to your side while in a flat bed without using bedrails?: A Little Help needed moving from lying on your back to sitting on the side of a flat bed without using bedrails?: A Little Help needed moving to and from a bed to a chair (including a wheelchair)?: A Little Help needed standing up from a chair using your arms (e.g., wheelchair or bedside chair)?: A Little Help needed to walk in hospital room?: A Little Help needed climbing 3-5 steps with a railing? : A Lot 6 Click Score: 17    End of Session Equipment Utilized During Treatment: Gait belt Activity Tolerance:  Patient limited by fatigue;Treatment limited secondary to medical complications (Comment) Patient left: in chair;with call bell/phone within reach;with chair alarm set Nurse Communication: Mobility status PT Visit Diagnosis: Unsteadiness on feet (R26.81);Muscle weakness (generalized) (M62.81);Difficulty in walking, not elsewhere classified (R26.2)    Time: 1610-9604 PT Time Calculation (min) (ACUTE ONLY): 27 min   Charges:   PT Evaluation $PT Eval Moderate Complexity: 1 Mod PT Treatments $Gait  Training: 8-22 mins       Ivar Drape 05/22/2022, 1:41 PM  Samul Dada, PT PhD Acute Rehab Dept. Number: [facility] [id number] and [facility] [phone]

## 2022-05-23 ENCOUNTER — Telehealth: Payer: Self-pay

## 2022-05-23 NOTE — Telephone Encounter (Signed)
Transition Care Management Follow-up Telephone Call Date of discharge and from where: 05/22/2022 [facility] How have you been since you were released from the hospital? Doing better Any questions or concerns? No  Items Reviewed: Did the pt receive and understand the discharge instructions provided? Yes  Medications obtained and verified? Yes  Other? No  Any new allergies since your discharge? No  Dietary orders reviewed? Yes Do you have support at home? Yes   Home Care and Equipment/Supplies: Were home health services ordered? not applicable If so, what is the name of the agency? N/a  Has the agency set up a time to come to the patient's home? not applicable Were any new equipment or medical supplies ordered?  No What is the name of the medical supply agency? N/a Were you able to get the supplies/equipment? not applicable Do you have any questions related to the use of the equipment or supplies? No  Functional Questionnaire: (I = Independent and D = Dependent) ADLs: I  Bathing/Dressing- I  Meal Prep- I  Eating- I  Maintaining continence- I  Transferring/Ambulation- I  Managing Meds- I  Follow up appointments reviewed:  PCP Hospital f/u appt confirmed? Yes  Scheduled to see Dr. Sarajane Jews on 06/01/2022 @ 1:00. Are transportation arrangements needed? No  If their condition worsens, is the pt aware to call PCP or go to the Emergency Dept.? Yes Was the patient provided with contact information for the PCP's office or ED? Yes Was to pt encouraged to call back with questions or concerns? Yes

## 2022-05-24 ENCOUNTER — Other Ambulatory Visit: Payer: Self-pay | Admitting: Family Medicine

## 2022-05-24 ENCOUNTER — Other Ambulatory Visit: Payer: Self-pay | Admitting: *Deleted

## 2022-05-24 NOTE — Patient Outreach (Signed)
[name]) Care Management  05/24/2022  [name] 09-Oct-1946 [id number]  Nurse called patient to follow-up after her hospital discharge. Patient reports feeling much better. She states that she has her discharge medication prescriptions and understands all of her discharge instructions. Patient states that [facility] agency contacted her today and will begin home visits tomorrow. Patient denies any needs for DME and explains she is using her walker to ambulate. Patient has follow up visits with PCP 06/01/22, ID 06/07/22, and pulmonology on 06/19/22. She is using the albuterol inhaler and reports coughing up some white phlegm but otherwise states she is breathing well. Nurse explained that her PCP's embedded RN CM will continue with her care and this nurse will close her case; Patient verbalized understanding.     Plan: RN Health Coach Discipline Closure Discipline Closure letter sent to PCP   Emelia Loron RN, [place] [phone] [email or web address]

## 2022-05-25 LAB — CULTURE, BLOOD (ROUTINE X 2)
Culture: NO GROWTH
Culture: NO GROWTH
Special Requests: ADEQUATE

## 2022-05-25 NOTE — Telephone Encounter (Signed)
Pt is calling and only has one glipizide pill left. Pt is aware md out of office until 05-28-2022

## 2022-05-30 ENCOUNTER — Telehealth: Payer: Self-pay | Admitting: Family Medicine

## 2022-05-30 DIAGNOSIS — R918 Other nonspecific abnormal finding of lung field: Secondary | ICD-10-CM | POA: Diagnosis not present

## 2022-05-30 DIAGNOSIS — A419 Sepsis, unspecified organism: Secondary | ICD-10-CM | POA: Diagnosis not present

## 2022-05-30 DIAGNOSIS — J9601 Acute respiratory failure with hypoxia: Secondary | ICD-10-CM | POA: Diagnosis not present

## 2022-05-30 DIAGNOSIS — Z9181 History of falling: Secondary | ICD-10-CM | POA: Diagnosis not present

## 2022-05-30 DIAGNOSIS — E782 Mixed hyperlipidemia: Secondary | ICD-10-CM | POA: Diagnosis not present

## 2022-05-30 DIAGNOSIS — N179 Acute kidney failure, unspecified: Secondary | ICD-10-CM | POA: Diagnosis not present

## 2022-05-30 DIAGNOSIS — I10 Essential (primary) hypertension: Secondary | ICD-10-CM | POA: Diagnosis not present

## 2022-05-30 DIAGNOSIS — E1140 Type 2 diabetes mellitus with diabetic neuropathy, unspecified: Secondary | ICD-10-CM | POA: Diagnosis not present

## 2022-05-30 DIAGNOSIS — Z87891 Personal history of nicotine dependence: Secondary | ICD-10-CM | POA: Diagnosis not present

## 2022-05-30 DIAGNOSIS — I69351 Hemiplegia and hemiparesis following cerebral infarction affecting right dominant side: Secondary | ICD-10-CM | POA: Diagnosis not present

## 2022-05-30 DIAGNOSIS — R6520 Severe sepsis without septic shock: Secondary | ICD-10-CM | POA: Diagnosis not present

## 2022-05-30 DIAGNOSIS — Z7984 Long term (current) use of oral hypoglycemic drugs: Secondary | ICD-10-CM | POA: Diagnosis not present

## 2022-05-30 DIAGNOSIS — N390 Urinary tract infection, site not specified: Secondary | ICD-10-CM | POA: Diagnosis not present

## 2022-05-30 NOTE — Telephone Encounter (Signed)
Seeking approval to begin home health services PT and OT on 06/01/2022

## 2022-05-31 ENCOUNTER — Telehealth: Payer: Self-pay | Admitting: Family Medicine

## 2022-05-31 NOTE — Telephone Encounter (Signed)
Please okay these orders  ?

## 2022-05-31 NOTE — Telephone Encounter (Signed)
[name] PT with [facility] is calling and needs VO started on 05-30-2022 PT 2 x 5 then 1x4 also patient need a order for 2 wheel rolling walker please fax to [phone]

## 2022-06-01 ENCOUNTER — Encounter: Payer: Self-pay | Admitting: Family Medicine

## 2022-06-01 ENCOUNTER — Ambulatory Visit (INDEPENDENT_AMBULATORY_CARE_PROVIDER_SITE_OTHER): Payer: Medicare Other | Admitting: Family Medicine

## 2022-06-01 ENCOUNTER — Other Ambulatory Visit: Payer: Self-pay

## 2022-06-01 VITALS — BP 138/64 | HR 52 | Temp 98.0°F | Wt 232.0 lb

## 2022-06-01 DIAGNOSIS — J984 Other disorders of lung: Secondary | ICD-10-CM | POA: Diagnosis not present

## 2022-06-01 DIAGNOSIS — J9601 Acute respiratory failure with hypoxia: Secondary | ICD-10-CM

## 2022-06-01 DIAGNOSIS — R6520 Severe sepsis without septic shock: Secondary | ICD-10-CM

## 2022-06-01 DIAGNOSIS — N184 Chronic kidney disease, stage 4 (severe): Secondary | ICD-10-CM | POA: Diagnosis not present

## 2022-06-01 DIAGNOSIS — N179 Acute kidney failure, unspecified: Secondary | ICD-10-CM | POA: Diagnosis not present

## 2022-06-01 DIAGNOSIS — A419 Sepsis, unspecified organism: Secondary | ICD-10-CM

## 2022-06-01 DIAGNOSIS — J189 Pneumonia, unspecified organism: Secondary | ICD-10-CM

## 2022-06-01 LAB — BASIC METABOLIC PANEL
BUN: 45 mg/dL — ABNORMAL HIGH (ref 6–23)
CO2: 24 mEq/L (ref 19–32)
Calcium: 10.2 mg/dL (ref 8.4–10.5)
Chloride: 103 mEq/L (ref 96–112)
Creatinine, Ser: 1.67 mg/dL — ABNORMAL HIGH (ref 0.40–1.20)
GFR: 29.79 mL/min — ABNORMAL LOW (ref >60.00)
Glucose, Bld: 79 mg/dL (ref 70–99)
Potassium: 4.6 mEq/L (ref 3.5–5.1)
Sodium: 137 mEq/L (ref 135–145)

## 2022-06-01 LAB — CBC WITH DIFFERENTIAL/PLATELET
Basophils Absolute: 0 10*3/uL (ref 0.0–0.1)
Basophils Relative: 0.4 % (ref 0.0–3.0)
Eosinophils Absolute: 0.3 10*3/uL (ref 0.0–0.7)
Eosinophils Relative: 3.4 % (ref 0.0–5.0)
HCT: 34.5 % — ABNORMAL LOW (ref 36.0–46.0)
Hemoglobin: 11.2 g/dL — ABNORMAL LOW (ref 12.0–15.0)
Lymphocytes Relative: 36.4 % (ref 12.0–46.0)
Lymphs Abs: 2.9 10*3/uL (ref 0.7–4.0)
MCHC: 32.4 g/dL (ref 30.0–36.0)
MCV: 93.4 fl (ref 78.0–100.0)
Monocytes Absolute: 0.9 10*3/uL (ref 0.1–1.0)
Monocytes Relative: 11.4 % (ref 3.0–12.0)
Neutro Abs: 3.9 10*3/uL (ref 1.4–7.7)
Neutrophils Relative %: 48.4 % (ref 43.0–77.0)
Platelets: 308 10*3/uL (ref 150.0–400.0)
RBC: 3.7 Mil/uL — ABNORMAL LOW (ref 3.87–5.11)
RDW: 15.6 % — ABNORMAL HIGH (ref 11.5–15.5)
WBC: 8 10*3/uL (ref 4.0–10.5)

## 2022-06-01 MED ORDER — GLIPIZIDE 10 MG PO TABS: 10.0000 mg | ORAL_TABLET | Freq: Two times a day (BID) | ORAL | 1 refills | Status: DC

## 2022-06-01 NOTE — Telephone Encounter (Signed)
Requesting verbal okay for Occupational therapy.  Previous telephone note requesting verbal order for PT is 2 times each week for 5 weeks, then 1 time each week for 4 weeks.  Please advise okay for Occupational Therapy

## 2022-06-01 NOTE — Progress Notes (Signed)
   Subjective:    Patient ID: [name], female    DOB: 11/01/1946, 76 y.o.   MRN: [id number]  HPI Here for a transitional care visit after a hospital stay from 05-20-22 to 05-22-22. She presented with fever, SOB, cough, sweats, and weakness. A CXR and then subsequent chest CT showed a 4.5 cm by 3.1 cm cavitary mass in the right upper lung. This was felt to either be a neoplastic lesion or a pneumonia. Her WBC got as high as 23.9, and this was down to 20.1 at DC. Pulmonology and Infectious Disease were consulted, and the consensus was that this was an aspiration pneumonia. Blood cultures were negative. She was treated with IV antibiotics and then sent home to take Augmentin for 4 weeks. She had an AKI with her creatinine peaking at 1.91 on admission, and this corrected to 1.42 on DC. She has been getting visits from home health nurses. She seems to be recovering nicely. Her appetite is good, and her strength is slowly returning. Her SOB and fever and cough have all resolved.    Review of Systems  Constitutional:  Positive for fatigue. Negative for chills, diaphoresis and fever.  Respiratory: Negative.    Cardiovascular: Negative.   Gastrointestinal: Negative.   Genitourinary: Negative.   Neurological: Negative.        Objective:   Physical Exam Constitutional:      Appearance: She is not ill-appearing.     Comments: In a wheelchair   Cardiovascular:     Rate and Rhythm: Normal rate and regular rhythm.     Pulses: Normal pulses.     Heart sounds: Normal heart sounds.  Pulmonary:     Effort: Pulmonary effort is normal.     Breath sounds: Normal breath sounds.  Neurological:     General: No focal deficit present.     Mental Status: She is alert and oriented to person, place, and time. Mental status is at baseline.           Assessment & Plan:  She is recovering from acute respiratory failure and sepsis due to an aspiration pneumonia. She is doing quite well overall. She also  had an AKI, but she is hydrating well now. We will check a BMET and a CBC today. She says the hospital staff told her to always have her dentures in when she eats and to chew her food thoroughly before swallowing. We will arrange for her to get PT and OT at home. She is scheduled to follow up with Pulmonology on 06-19-22, and I am sure they will get another CXR at that visit.  Alysia Penna, MD

## 2022-06-01 NOTE — Telephone Encounter (Signed)
Left detailed message for Aaron Edelman with [facility] regarding verbal orders requested

## 2022-06-01 NOTE — Telephone Encounter (Signed)
Please okay these orders  ?

## 2022-06-04 NOTE — Addendum Note (Signed)
Addended by: Alysia Penna A on: 06/04/2022 07:43 AM   Modules accepted: Orders

## 2022-06-05 DIAGNOSIS — I69351 Hemiplegia and hemiparesis following cerebral infarction affecting right dominant side: Secondary | ICD-10-CM | POA: Diagnosis not present

## 2022-06-05 DIAGNOSIS — R6520 Severe sepsis without septic shock: Secondary | ICD-10-CM | POA: Diagnosis not present

## 2022-06-05 DIAGNOSIS — N390 Urinary tract infection, site not specified: Secondary | ICD-10-CM | POA: Diagnosis not present

## 2022-06-05 DIAGNOSIS — J9601 Acute respiratory failure with hypoxia: Secondary | ICD-10-CM | POA: Diagnosis not present

## 2022-06-05 DIAGNOSIS — A419 Sepsis, unspecified organism: Secondary | ICD-10-CM | POA: Diagnosis not present

## 2022-06-05 DIAGNOSIS — R918 Other nonspecific abnormal finding of lung field: Secondary | ICD-10-CM | POA: Diagnosis not present

## 2022-06-05 DIAGNOSIS — E1140 Type 2 diabetes mellitus with diabetic neuropathy, unspecified: Secondary | ICD-10-CM | POA: Diagnosis not present

## 2022-06-05 DIAGNOSIS — I10 Essential (primary) hypertension: Secondary | ICD-10-CM | POA: Diagnosis not present

## 2022-06-05 DIAGNOSIS — Z9181 History of falling: Secondary | ICD-10-CM | POA: Diagnosis not present

## 2022-06-05 DIAGNOSIS — Z7984 Long term (current) use of oral hypoglycemic drugs: Secondary | ICD-10-CM | POA: Diagnosis not present

## 2022-06-05 DIAGNOSIS — N179 Acute kidney failure, unspecified: Secondary | ICD-10-CM | POA: Diagnosis not present

## 2022-06-05 DIAGNOSIS — Z87891 Personal history of nicotine dependence: Secondary | ICD-10-CM | POA: Diagnosis not present

## 2022-06-05 DIAGNOSIS — E782 Mixed hyperlipidemia: Secondary | ICD-10-CM | POA: Diagnosis not present

## 2022-06-05 NOTE — Telephone Encounter (Signed)
Left detailed message regarding approver verbal  orders, advised to call back with any questions

## 2022-06-07 ENCOUNTER — Ambulatory Visit (INDEPENDENT_AMBULATORY_CARE_PROVIDER_SITE_OTHER): Payer: Medicare Other | Admitting: Internal Medicine

## 2022-06-07 ENCOUNTER — Encounter: Payer: Self-pay | Admitting: Internal Medicine

## 2022-06-07 ENCOUNTER — Other Ambulatory Visit: Payer: Self-pay

## 2022-06-07 VITALS — BP 166/87 | HR 66 | Temp 97.6°F

## 2022-06-07 DIAGNOSIS — J690 Pneumonitis due to inhalation of food and vomit: Secondary | ICD-10-CM | POA: Diagnosis not present

## 2022-06-07 NOTE — Progress Notes (Signed)
[facility] for Infectious Disease  Patient Active Problem List   Diagnosis Date Noted   Community acquired pneumonia of right lung    Cavitary lesion of lung 05/20/2022   Severe sepsis ([place]) 05/20/2022   Acute respiratory failure with hypoxia ([place]) 05/20/2022   UTI (urinary tract infection) 05/20/2022   AKI (acute kidney injury) ([place]) 05/20/2022   Hypercalcemia 05/20/2022   Transaminitis 05/20/2022   Type 2 diabetes mellitus ([place]) 05/20/2022   Colon cancer screening 05/26/2020   Type 2 diabetes mellitus with diabetic neuropathy, unspecified ([place]) 09/21/2019   Neuropathy, diabetic ([place]) 09/21/2019   Bilateral lower extremity edema 09/21/2019   CVA (cerebral vascular accident) ([place]) 06/28/2019   Low back pain 11/29/2017   Syncope 11/03/2015   HYPERLIPIDEMIA, 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   MRN: [id number]  Chief Complaint  Patient presents with   Follow-up    HPI:  [name] is a 76 y.o. female stroke here for f/u recent admission for aspiration pna with cavitary changes   When I saw her in hospital she didn't endorse aspiration but on today's visit reported she did She said she'll wear her dentures from now on when eating as told by therapy to do that  She feels well. No malaise, cough, chest pain, f/c, or using any oxygen supplement.  She gets around with cane at home but is here in wheel chair  She will see pulm in a couple weeks and there is plan to repeat chest imaging   She has no other complaint today   Allergies  Allergen Reactions   Actonel [Risedronate] Other (See Comments)    It made her "feel like I'm going backwards"    Hydromet [Hydrocodone Bit-Homatrop Mbr] Itching      Outpatient Medications Prior to Visit  Medication Sig Dispense Refill   albuterol (VENTOLIN HFA) 108 (90 Base) MCG/ACT inhaler Inhale 2 puffs  into the lungs every 6 (six) hours as needed for wheezing or shortness of breath. 8 g 2   amLODipine (NORVASC) 5 MG tablet Take 1 tablet by mouth once daily (Patient taking differently: Take 5 mg by mouth daily.) 90 tablet 0   amoxicillin-clavulanate (AUGMENTIN) 875-125 MG tablet Take 1 tablet by mouth every 12 (twelve) hours. 60 tablet 0   aspirin 81 MG chewable tablet Chew 1 tablet (81 mg total) by mouth daily. 30 tablet 3   Blood Glucose Monitoring Suppl (ONETOUCH VERIO) w/Device KIT Use to check blood sugar two times daily 1 kit 0   diclofenac (VOLTAREN) 75 MG EC tablet Take 1 tablet (75 mg total) by mouth 2 (two) times daily. 180 tablet 3   empagliflozin (JARDIANCE) 25 MG TABS tablet Take 1 tablet (25 mg total) by mouth daily before breakfast. 90 tablet 2   furosemide (LASIX) 20 MG tablet Take 1 tablet by mouth once daily (Patient taking differently: Take 20 mg by mouth daily.) 90 tablet 0   glipiZIDE (GLUCOTROL) 10 MG tablet Take 1 tablet (10 mg total) by mouth 2 (two) times daily before a meal. 180 tablet 1   Lancets (ONETOUCH ULTRASOFT) lancets USE   TO CHECK GLUCOSE ONCE DAILY 100 each 0   lisinopril (ZESTRIL) 10 MG tablet Take 2 tablets by mouth once daily (Patient taking differently: Take 20 mg by mouth daily.) 180 tablet 0   metFORMIN (  GLUCOPHAGE) 500 MG tablet Take 1 tablet (500 mg total) by mouth 2 (two) times daily with a meal. 180 tablet 3   metoprolol succinate (TOPROL-XL) 50 MG 24 hr tablet TAKE ONE TABLET BY MOUTH WITH OR IMMEDIATELY FOLLOWING A MEAL (Patient taking differently: Take 50 mg by mouth daily.) 90 tablet 0   ONETOUCH VERIO test strip USE 1 STRIP TO CHECK GLUCOSE ONCE DAILY 50 each 0   rosuvastatin (CRESTOR) 40 MG tablet Take 1 tablet (40 mg total) by mouth daily. 90 tablet 3   spironolactone (ALDACTONE) 25 MG tablet Take 1 tablet by mouth once daily (Patient taking differently: Take 25 mg by mouth daily.) 90 tablet 0   Alcohol Swabs (ALCOHOL PREP) PADS Use to test 1 X  day and diagnosis code is E 11.9 100 each 1   No facility-administered medications prior to visit.     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    Hx raising of her deceased sister's 3 children, and is now raising/supporting 10yo  'great-grandson'.    Attends church, church community as support system. Many of her family has passed away.   Enjoys watching TV; used to enjoy going fishing         Social Determinants of Health   Financial Resource Strain: Low Risk  (11/14/2021)   Overall Financial Resource Strain (CARDIA)    Difficulty of Paying Living Expenses: Not very hard  Food Insecurity: No Food Insecurity (03/05/2022)   Hunger Vital Sign    Worried About Running Out of Food in the Last Year: Never true    Ran Out of Food in the Last Year: Never true  Transportation Needs: No Transportation Needs (03/05/2022)   PRAPARE - Hydrologist (Medical): No    Lack of Transportation (Non-Medical): No  Physical Activity: Inactive (01/23/2019)   Exercise Vital Sign    Days of Exercise per Week: 0 days    Minutes of Exercise per Session: 0 min  Stress: No Stress Concern Present  (11/14/2021)   [place]    Feeling of Stress : Not at all  Social Connections: Socially Isolated (06/23/2021)   Social Connection and Isolation Panel [NHANES]    Frequency of Communication with Friends and Family: Once a week    Frequency of Social Gatherings with Friends and Family: Once a week    Attends Religious Services: 1 to 4 times per year    Active Member of Clubs or  Organizations: No    Attends Archivist Meetings: Never    Marital Status: Widowed  Intimate Partner Violence: Not At Risk (06/23/2021)   Humiliation, Afraid, Rape, and Kick questionnaire    Fear of Current or Ex-Partner: No    Emotionally Abused: No    Physically Abused: No    Sexually Abused: No      Review of Systems     Objective:    BP (!) 166/87   Pulse 66   Temp 97.6 F (36.4 C) (Oral)   SpO2 94%  Nursing note and vital signs reviewed.  Physical Exam     General/constitutional: no distress, pleasant; in wheel chair (just to get in/out of clinic; gets around with cane at home) HEENT: Normocephalic, PER, Conj Clear, EOMI, Oropharynx clear Neck supple CV: rrr no mrg Lungs: clear to auscultation, normal respiratory effort Abd: Soft, Nontender Ext: 1+ bilateral LE edema Skin: No Rash Neuro: nonfocal MSK: no peripheral joint swelling/tenderness/warmth; back spines nontender    Labs: Lab Results  Component Value Date   WBC 8.0 06/01/2022   HGB 11.2 (L) 06/01/2022   HCT 34.5 (L) 06/01/2022   MCV 93.4 06/01/2022   PLT 308.0 94/80/1655   Last metabolic panel Lab Results  Component Value Date   GLUCOSE 79 06/01/2022   NA 137 06/01/2022   K 4.6 06/01/2022   CL 103 06/01/2022   CO2 24 06/01/2022   BUN 45 (H) 06/01/2022   CREATININE 1.67 (H) 06/01/2022   GFRNONAA 39 (L) 05/22/2022   CALCIUM 10.2 06/01/2022   PROT 6.8 05/21/2022   ALBUMIN 2.3 (L) 05/21/2022   BILITOT 0.8 05/21/2022   ALKPHOS 56 05/21/2022    AST 35 05/21/2022   ALT 55 (H) 05/21/2022   ANIONGAP 11 05/22/2022    Micro:  Serology:  Imaging:  Assessment & Plan:   Problem List Items Addressed This Visit   None Visit Diagnoses     Aspiration pneumonia, unspecified aspiration pneumonia type, unspecified laterality, unspecified part of lung (HCC)    -  Primary      Abx: Augmentin 6/27+4weeks  76 yo female hx cva, no known dysphagia, admitted for sepsis 6/25-27 with hypoxic respiratory failure found to have ct imaging of cavitary lung changes posterior right upper lobe.   No known risk for tb Saw pulm 7/07 doing well and there is a planned cxr ordered rior to another f/u pulm 7/25  -finish amox-clav course 4 weeks -can f/u pulm as already arranged -no need to f/u id at this time   I have spent a total of 20 minutes of face-to-face and non-face-to-face time, excluding clinical staff time, preparing to see patient, ordering tests and/or medications, and provide counseling the patient      Follow-up: No follow-ups on file.      Jabier Mutton, [facility] 06/07/2022, 3:24 PM

## 2022-06-07 NOTE — Patient Instructions (Signed)
Finish your antibiotics   You don't need to see [place] as you'll be seeing the lung doctor and there is chest xray planned   If ongoing chest pain/fever/chill/cough please call our clinic

## 2022-06-08 DIAGNOSIS — R6520 Severe sepsis without septic shock: Secondary | ICD-10-CM | POA: Diagnosis not present

## 2022-06-08 DIAGNOSIS — J189 Pneumonia, unspecified organism: Secondary | ICD-10-CM | POA: Diagnosis not present

## 2022-06-08 DIAGNOSIS — A419 Sepsis, unspecified organism: Secondary | ICD-10-CM | POA: Diagnosis not present

## 2022-06-08 DIAGNOSIS — Z7984 Long term (current) use of oral hypoglycemic drugs: Secondary | ICD-10-CM

## 2022-06-08 DIAGNOSIS — E1140 Type 2 diabetes mellitus with diabetic neuropathy, unspecified: Secondary | ICD-10-CM | POA: Diagnosis not present

## 2022-06-08 DIAGNOSIS — I10 Essential (primary) hypertension: Secondary | ICD-10-CM | POA: Diagnosis not present

## 2022-06-08 DIAGNOSIS — N179 Acute kidney failure, unspecified: Secondary | ICD-10-CM | POA: Diagnosis not present

## 2022-06-08 DIAGNOSIS — R918 Other nonspecific abnormal finding of lung field: Secondary | ICD-10-CM

## 2022-06-08 DIAGNOSIS — I69351 Hemiplegia and hemiparesis following cerebral infarction affecting right dominant side: Secondary | ICD-10-CM | POA: Diagnosis not present

## 2022-06-08 DIAGNOSIS — Z9181 History of falling: Secondary | ICD-10-CM

## 2022-06-08 DIAGNOSIS — N390 Urinary tract infection, site not specified: Secondary | ICD-10-CM | POA: Diagnosis not present

## 2022-06-08 DIAGNOSIS — J9601 Acute respiratory failure with hypoxia: Secondary | ICD-10-CM | POA: Diagnosis not present

## 2022-06-08 DIAGNOSIS — E782 Mixed hyperlipidemia: Secondary | ICD-10-CM | POA: Diagnosis not present

## 2022-06-08 DIAGNOSIS — Z87891 Personal history of nicotine dependence: Secondary | ICD-10-CM | POA: Diagnosis not present

## 2022-06-08 NOTE — Telephone Encounter (Signed)
Spoke with Verdis Frederickson regarding verbal orders requested, state that she received the approval

## 2022-06-08 NOTE — Telephone Encounter (Signed)
Left detailed message for [name] with [facility] advising that the verbal orders he requested for pt have been approved by Dr Sarajane Jews

## 2022-06-12 ENCOUNTER — Other Ambulatory Visit: Payer: Self-pay | Admitting: Family Medicine

## 2022-06-12 DIAGNOSIS — N390 Urinary tract infection, site not specified: Secondary | ICD-10-CM | POA: Diagnosis not present

## 2022-06-12 DIAGNOSIS — Z7984 Long term (current) use of oral hypoglycemic drugs: Secondary | ICD-10-CM | POA: Diagnosis not present

## 2022-06-12 DIAGNOSIS — R918 Other nonspecific abnormal finding of lung field: Secondary | ICD-10-CM | POA: Diagnosis not present

## 2022-06-12 DIAGNOSIS — A419 Sepsis, unspecified organism: Secondary | ICD-10-CM | POA: Diagnosis not present

## 2022-06-12 DIAGNOSIS — E1140 Type 2 diabetes mellitus with diabetic neuropathy, unspecified: Secondary | ICD-10-CM | POA: Diagnosis not present

## 2022-06-12 DIAGNOSIS — I10 Essential (primary) hypertension: Secondary | ICD-10-CM | POA: Diagnosis not present

## 2022-06-12 DIAGNOSIS — J9601 Acute respiratory failure with hypoxia: Secondary | ICD-10-CM | POA: Diagnosis not present

## 2022-06-12 DIAGNOSIS — N179 Acute kidney failure, unspecified: Secondary | ICD-10-CM | POA: Diagnosis not present

## 2022-06-12 DIAGNOSIS — E782 Mixed hyperlipidemia: Secondary | ICD-10-CM | POA: Diagnosis not present

## 2022-06-12 DIAGNOSIS — Z87891 Personal history of nicotine dependence: Secondary | ICD-10-CM | POA: Diagnosis not present

## 2022-06-12 DIAGNOSIS — Z9181 History of falling: Secondary | ICD-10-CM | POA: Diagnosis not present

## 2022-06-12 DIAGNOSIS — I69351 Hemiplegia and hemiparesis following cerebral infarction affecting right dominant side: Secondary | ICD-10-CM | POA: Diagnosis not present

## 2022-06-12 DIAGNOSIS — R6520 Severe sepsis without septic shock: Secondary | ICD-10-CM | POA: Diagnosis not present

## 2022-06-14 DIAGNOSIS — E782 Mixed hyperlipidemia: Secondary | ICD-10-CM | POA: Diagnosis not present

## 2022-06-14 DIAGNOSIS — R918 Other nonspecific abnormal finding of lung field: Secondary | ICD-10-CM | POA: Diagnosis not present

## 2022-06-14 DIAGNOSIS — I10 Essential (primary) hypertension: Secondary | ICD-10-CM | POA: Diagnosis not present

## 2022-06-14 DIAGNOSIS — J9601 Acute respiratory failure with hypoxia: Secondary | ICD-10-CM | POA: Diagnosis not present

## 2022-06-14 DIAGNOSIS — R6520 Severe sepsis without septic shock: Secondary | ICD-10-CM | POA: Diagnosis not present

## 2022-06-14 DIAGNOSIS — N179 Acute kidney failure, unspecified: Secondary | ICD-10-CM | POA: Diagnosis not present

## 2022-06-14 DIAGNOSIS — A419 Sepsis, unspecified organism: Secondary | ICD-10-CM | POA: Diagnosis not present

## 2022-06-14 DIAGNOSIS — Z7984 Long term (current) use of oral hypoglycemic drugs: Secondary | ICD-10-CM | POA: Diagnosis not present

## 2022-06-14 DIAGNOSIS — N390 Urinary tract infection, site not specified: Secondary | ICD-10-CM | POA: Diagnosis not present

## 2022-06-14 DIAGNOSIS — Z87891 Personal history of nicotine dependence: Secondary | ICD-10-CM | POA: Diagnosis not present

## 2022-06-14 DIAGNOSIS — Z9181 History of falling: Secondary | ICD-10-CM | POA: Diagnosis not present

## 2022-06-14 DIAGNOSIS — E1140 Type 2 diabetes mellitus with diabetic neuropathy, unspecified: Secondary | ICD-10-CM | POA: Diagnosis not present

## 2022-06-14 DIAGNOSIS — I69351 Hemiplegia and hemiparesis following cerebral infarction affecting right dominant side: Secondary | ICD-10-CM | POA: Diagnosis not present

## 2022-06-18 DIAGNOSIS — Z9181 History of falling: Secondary | ICD-10-CM | POA: Diagnosis not present

## 2022-06-18 DIAGNOSIS — R918 Other nonspecific abnormal finding of lung field: Secondary | ICD-10-CM | POA: Diagnosis not present

## 2022-06-18 DIAGNOSIS — E1140 Type 2 diabetes mellitus with diabetic neuropathy, unspecified: Secondary | ICD-10-CM | POA: Diagnosis not present

## 2022-06-18 DIAGNOSIS — J9601 Acute respiratory failure with hypoxia: Secondary | ICD-10-CM | POA: Diagnosis not present

## 2022-06-18 DIAGNOSIS — R6520 Severe sepsis without septic shock: Secondary | ICD-10-CM | POA: Diagnosis not present

## 2022-06-18 DIAGNOSIS — N390 Urinary tract infection, site not specified: Secondary | ICD-10-CM | POA: Diagnosis not present

## 2022-06-18 DIAGNOSIS — N179 Acute kidney failure, unspecified: Secondary | ICD-10-CM | POA: Diagnosis not present

## 2022-06-18 DIAGNOSIS — Z7984 Long term (current) use of oral hypoglycemic drugs: Secondary | ICD-10-CM | POA: Diagnosis not present

## 2022-06-18 DIAGNOSIS — Z87891 Personal history of nicotine dependence: Secondary | ICD-10-CM | POA: Diagnosis not present

## 2022-06-18 DIAGNOSIS — E782 Mixed hyperlipidemia: Secondary | ICD-10-CM | POA: Diagnosis not present

## 2022-06-18 DIAGNOSIS — I10 Essential (primary) hypertension: Secondary | ICD-10-CM | POA: Diagnosis not present

## 2022-06-18 DIAGNOSIS — I69351 Hemiplegia and hemiparesis following cerebral infarction affecting right dominant side: Secondary | ICD-10-CM | POA: Diagnosis not present

## 2022-06-18 DIAGNOSIS — A419 Sepsis, unspecified organism: Secondary | ICD-10-CM | POA: Diagnosis not present

## 2022-06-19 ENCOUNTER — Ambulatory Visit (INDEPENDENT_AMBULATORY_CARE_PROVIDER_SITE_OTHER): Payer: Medicare Other | Admitting: Pulmonary Disease

## 2022-06-19 ENCOUNTER — Ambulatory Visit (INDEPENDENT_AMBULATORY_CARE_PROVIDER_SITE_OTHER): Payer: Medicare Other

## 2022-06-19 ENCOUNTER — Encounter: Payer: Self-pay | Admitting: Pulmonary Disease

## 2022-06-19 VITALS — BP 140/60 | HR 55 | Temp 98.2°F | Ht 65.0 in | Wt 220.2 lb

## 2022-06-19 DIAGNOSIS — R918 Other nonspecific abnormal finding of lung field: Secondary | ICD-10-CM | POA: Diagnosis not present

## 2022-06-19 DIAGNOSIS — R1319 Other dysphagia: Secondary | ICD-10-CM | POA: Diagnosis not present

## 2022-06-19 DIAGNOSIS — J984 Other disorders of lung: Secondary | ICD-10-CM

## 2022-06-19 DIAGNOSIS — M5134 Other intervertebral disc degeneration, thoracic region: Secondary | ICD-10-CM | POA: Diagnosis not present

## 2022-06-19 NOTE — Progress Notes (Signed)
Synopsis: Referred in July 2023 for hospital follow up for pneumonia  Subjective:   PATIENT ID: [name] GENDER: female DOB: 02/09/1946, MRN: [id number]  HPI  Chief Complaint  Patient presents with   Follow-up    Follow-up hosp.    Bayli Quesinberry is a 76 year old woman, former smoker with hypertension, CVA with residual right sided weakness and DMII who is referred to pulmonary clinic for hospital follow up of pneumonia.   She was admitted 6/25 to 6/27 for concern of aspiration pneumonia vs cavitary mass. She presented with fever, cough, sore throat and swollen glands of her neck. CT Chest showed a thick walled cavitary lung mass measuring 3.9 x 2.8 x 2.8cm and small satellite nodule measuring 27m within the subpleural aspect of the posterior right upper lobe. No enlarged mediastinal or hilar lymph nodes.    Speech evaluation was notable for mild aspiration risk. She was having difficulty swallowing due to her sore throat and swollen glands in her neck.   She was started on broad spectrum antibiotics with improvement in her symptoms. Extended respiratory viral panel was negative. MRSA screen was negative. Urine legionella and strep pneumoniae antigens were negative.  She was discharged with 1 month of augmentin therapy since she had swift response to treatment in hospital and bronchoscopy was deferred until further follow up.   Since discharge, she has been doing great since being home. She feels she is back to baseline. She has no cough and no sputum production. No hemoptysis. She denies weight loss, fevers, chills or night sweats.   Past Medical History:  Diagnosis Date   Acute bronchitis 07/22/2008   Arthritis    Cataract    Diabetes mellitus without complication ([facility] 89/83/3825  Heart murmur    History of CVA (cerebrovascular accident)    HYPERLIPIDEMIA, MIXED 07/18/2007   Hypertension    Stroke ([facility] 06/26/2019   weakness R side     Family History  Problem  Relation Age of Onset   Heart disease Father        No details   Heart failure Mother    Diabetes Neg Hx    Colon cancer Neg Hx    Esophageal cancer Neg Hx    Rectal cancer Neg Hx    Stomach cancer Neg Hx      Social History   Socioeconomic History   Marital status: Widowed    Spouse name: Not on file   Number of children: 0   Years of education: 116  Highest education level: Not on file  Occupational History   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    Hx raising of her deceased sister's 3 children, and is now raising/supporting 10yo  'great-grandson'.    Attends  church, church community as support system. Many of her family has passed away.   Enjoys watching TV; used to enjoy going fishing         Social Determinants of Health   Financial Resource Strain: Low Risk  (11/14/2021)   Overall Financial Resource Strain (CARDIA)    Difficulty of Paying Living Expenses: Not very hard  Food Insecurity: No Food Insecurity (03/05/2022)   Hunger Vital Sign    Worried About Running Out of Food in the Last Year: Never true    Ran Out of Food in the Last Year: Never true  Transportation Needs: No Transportation Needs (03/05/2022)   PRAPARE - Hydrologist (Medical): No    Lack of Transportation (Non-Medical): No  Physical Activity: Inactive (01/23/2019)   Exercise Vital Sign    Days  of Exercise per Week: 0 days    Minutes of Exercise per Session: 0 min  Stress: No Stress Concern Present (11/14/2021)   [place]    Feeling of Stress : Not at all  Social Connections: Socially Isolated (06/23/2021)   Social Connection and Isolation Panel [NHANES]    Frequency of Communication with Friends and Family: Once a week    Frequency of Social Gatherings with Friends and Family: Once a week    Attends Religious Services: 1 to 4 times per year    Active Member of [employer] or Organizations: No    Attends Archivist Meetings: Never    Marital Status: Widowed  Intimate Partner Violence: Not At Risk (06/23/2021)   Humiliation, Afraid, Rape, and Kick questionnaire    Fear of Current or Ex-Partner: No    Emotionally Abused: No    Physically Abused: No    Sexually Abused: No     Allergies  Allergen Reactions   Actonel [Risedronate] Other (See Comments)    It made her "feel like I'm going backwards"    Hydromet [Hydrocodone Bit-Homatrop Mbr] Itching     Outpatient Medications Prior to Visit  Medication Sig Dispense Refill   albuterol (VENTOLIN HFA) 108 (90 Base) MCG/ACT inhaler Inhale 2 puffs into the lungs every 6 (six) hours as needed for wheezing or shortness of breath. 8 g 2   amoxicillin-clavulanate (AUGMENTIN) 875-125 MG tablet Take 1 tablet by mouth every 12 (twelve) hours. (Patient taking differently: Take 1 tablet by mouth every 12 (twelve) hours. Done in 2 days as of 06/19/22.) 60 tablet 0   Alcohol Swabs (ALCOHOL PREP) PADS Use to test 1 X day and diagnosis code is E 11.9 100 each 1   amLODipine (NORVASC) 5 MG tablet Take 1 tablet by mouth once daily 90 tablet 0   aspirin 81 MG chewable tablet Chew 1 tablet (81 mg total) by mouth daily. 30 tablet 3   Blood Glucose Monitoring Suppl (ONETOUCH VERIO) w/Device KIT Use to check blood sugar two times daily 1 kit 0   diclofenac (VOLTAREN) 75 MG EC tablet  Take 1 tablet (75 mg total) by mouth 2 (two) times daily. 180 tablet 3   empagliflozin (JARDIANCE) 25 MG TABS tablet Take 1 tablet (25 mg total) by mouth daily before breakfast. 90 tablet 2   furosemide (LASIX) 20 MG tablet Take 1 tablet by mouth once daily (Patient taking differently: Take 20 mg by mouth daily.) 90 tablet 0   glipiZIDE (GLUCOTROL) 10 MG tablet Take 1 tablet (10 mg total) by mouth 2 (two) times daily before a meal.  180 tablet 1   Lancets (ONETOUCH ULTRASOFT) lancets USE   TO CHECK GLUCOSE ONCE DAILY 100 each 0   lisinopril (ZESTRIL) 10 MG tablet Take 2 tablets by mouth once daily 180 tablet 0   metFORMIN (GLUCOPHAGE) 500 MG tablet Take 1 tablet (500 mg total) by mouth 2 (two) times daily with a meal. 180 tablet 3   metoprolol succinate (TOPROL-XL) 50 MG 24 hr tablet TAKE ONE TABLET BY MOUTH WITH OR IMMEDIATELY FOLLOWING A MEAL (Patient taking differently: Take 50 mg by mouth daily.) 90 tablet 0   ONETOUCH VERIO test strip USE 1 STRIP TO CHECK GLUCOSE ONCE DAILY 50 each 0   rosuvastatin (CRESTOR) 40 MG tablet Take 1 tablet (40 mg total) by mouth daily. 90 tablet 3   spironolactone (ALDACTONE) 25 MG tablet Take 1 tablet by mouth once daily (Patient taking differently: Take 25 mg by mouth daily.) 90 tablet 0   No facility-administered medications prior to visit.   Review of Systems  Constitutional:  Negative for chills, fever, malaise/fatigue and weight loss.  HENT:  Negative for congestion, sinus pain and sore throat.   Eyes: Negative.   Respiratory:  Negative for cough, hemoptysis, sputum production, shortness of breath and wheezing.   Cardiovascular:  Positive for leg swelling. Negative for chest pain, palpitations, orthopnea and claudication.  Gastrointestinal:  Negative for abdominal pain, heartburn, nausea and vomiting.  Genitourinary: Negative.   Musculoskeletal:  Negative for joint pain and myalgias.  Skin:  Negative for rash.  Neurological:  Negative for weakness.   Endo/Heme/Allergies: Negative.   Psychiatric/Behavioral: Negative.     Objective:   Vitals:   06/19/22 1103  BP: 140/60  Pulse: (!) 55  Temp: 98.2 F (36.8 C)  TempSrc: Oral  SpO2: 98%  Weight: 220 lb 3.2 oz (99.9 kg)  Height: _0  (1.651 m)     Physical Exam Constitutional:      General: She is not in acute distress.    Appearance: She is obese. She is not ill-appearing.  HENT:     Head: Normocephalic and atraumatic.  Eyes:     General: No scleral icterus.    Conjunctiva/sclera: Conjunctivae normal.     Pupils: Pupils are equal, round, and reactive to light.  Cardiovascular:     Rate and Rhythm: Normal rate and regular rhythm.     Pulses: Normal pulses.     Heart sounds: Normal heart sounds. No murmur heard. Pulmonary:     Effort: Pulmonary effort is normal.     Breath sounds: Normal breath sounds. No wheezing, rhonchi or rales.  Abdominal:     General: Bowel sounds are normal.     Palpations: Abdomen is soft.  Musculoskeletal:     Right lower leg: Edema present.     Left lower leg: Edema present.  Lymphadenopathy:     Cervical: No cervical adenopathy.  Skin:    General: Skin is warm and dry.  Neurological:     General: No focal deficit present.     Mental Status: She is alert.  Psychiatric:        Mood and Affect: Mood normal.        Behavior: Behavior normal.        Thought Content: Thought content normal.        Judgment: Judgment normal.    CBC    Component Value Date/Time   WBC 8.0 06/01/2022 1339   RBC 3.70 (L) 06/01/2022 1339   HGB 11.2 (L) 06/01/2022 1339  HCT 34.5 (L) 06/01/2022 1339   PLT 308.0 06/01/2022 1339   MCV 93.4 06/01/2022 1339   MCH 29.6 05/22/2022 0053   MCHC 32.4 06/01/2022 1339   RDW 15.6 (H) 06/01/2022 1339   LYMPHSABS 2.9 06/01/2022 1339   MONOABS 0.9 06/01/2022 1339   EOSABS 0.3 06/01/2022 1339   BASOSABS 0.0 06/01/2022 1339      Latest Ref Rng & Units 06/01/2022    1:39 PM 05/22/2022   12:53 AM 05/21/2022    7:25  AM  BMP  Glucose 70 - 99 mg/dL 79  121  122   BUN 6 - 23 mg/dL 45  20  26   Creatinine 0.40 - 1.20 mg/dL 1.67  1.42  1.47   Sodium 135 - 145 mEq/L 137  138  138   Potassium 3.5 - 5.1 mEq/L 4.6  4.4  4.7   Chloride 96 - 112 mEq/L 103  109  107   CO2 19 - 32 mEq/L _0 Calcium 8.4 - 10.5 mg/dL 10.2  9.7  9.6    9.3    Chest imaging: CT Chest 05/20/22 1. Thick walled cavitary lung mass within the posterior right upper lobe is identified and is worrisome for primary bronchogenic carcinoma. Squamous cell carcinoma may show cavitation within the lungs. Differential consideration include infectious/inflammatory process. Given the absence of additional cavitary lung lesions septic emboli is considered less favored. 2. No signs of nodal metastasis or distant metastatic disease. 3. Sigmoid diverticulosis without signs of acute diverticulitis. 4. Aortic Atherosclerosis (ICD10-I70.0).  PFT:     No data to display          Labs:  Path:  Echo:  Heart Catheterization:  Assessment & Plan:   Cavitary lesion of lung - Plan: CT Chest Wo Contrast, DG Chest 2 View  Other dysphagia  Discussion: [name] is a 76 year old woman, former smoker with hypertension, CVA with residual right sided weakness and DMII who is referred to pulmonary clinic for hospital follow up of pneumonia.   She was admitted 6/25 to 6/27 for concern of aspiration pneumonia vs cavitary mass. She is feeling much better and is near completion of extended augmentin course.  We will follow up chest x-ray today and consider extending her antibiotic course if necessary. We will follow up a CT Chest scan in 4-6 weeks. We discussed the concern for possible cancer and the need to follow this lesion closely.   Follow up in 4-6 weeks after CT Chest scan.  Pamela Jackson, MD [facility] Office: [phone]   Current Outpatient Medications:    albuterol (VENTOLIN HFA) 108 (90 Base)  MCG/ACT inhaler, Inhale 2 puffs into the lungs every 6 (six) hours as needed for wheezing or shortness of breath., Disp: 8 g, Rfl: 2   amoxicillin-clavulanate (AUGMENTIN) 875-125 MG tablet, Take 1 tablet by mouth every 12 (twelve) hours. (Patient taking differently: Take 1 tablet by mouth every 12 (twelve) hours. Done in 2 days as of 06/19/22.), Disp: 60 tablet, Rfl: 0   Alcohol Swabs (ALCOHOL PREP) PADS, Use to test 1 X day and diagnosis code is E 11.9, Disp: 100 each, Rfl: 1   amLODipine (NORVASC) 5 MG tablet, Take 1 tablet by mouth once daily, Disp: 90 tablet, Rfl: 0   aspirin 81 MG chewable tablet, Chew 1 tablet (81 mg total) by mouth daily., Disp: 30 tablet, Rfl: 3   Blood Glucose Monitoring Suppl (ONETOUCH VERIO) w/Device KIT, Use  to check blood sugar two times daily, Disp: 1 kit, Rfl: 0   diclofenac (VOLTAREN) 75 MG EC tablet, Take 1 tablet (75 mg total) by mouth 2 (two) times daily., Disp: 180 tablet, Rfl: 3   empagliflozin (JARDIANCE) 25 MG TABS tablet, Take 1 tablet (25 mg total) by mouth daily before breakfast., Disp: 90 tablet, Rfl: 2   furosemide (LASIX) 20 MG tablet, Take 1 tablet by mouth once daily (Patient taking differently: Take 20 mg by mouth daily.), Disp: 90 tablet, Rfl: 0   glipiZIDE (GLUCOTROL) 10 MG tablet, Take 1 tablet (10 mg total) by mouth 2 (two) times daily before a meal., Disp: 180 tablet, Rfl: 1   Lancets (ONETOUCH ULTRASOFT) lancets, USE   TO CHECK GLUCOSE ONCE DAILY, Disp: 100 each, Rfl: 0   lisinopril (ZESTRIL) 10 MG tablet, Take 2 tablets by mouth once daily, Disp: 180 tablet, Rfl: 0   metFORMIN (GLUCOPHAGE) 500 MG tablet, Take 1 tablet (500 mg total) by mouth 2 (two) times daily with a meal., Disp: 180 tablet, Rfl: 3   metoprolol succinate (TOPROL-XL) 50 MG 24 hr tablet, TAKE ONE TABLET BY MOUTH WITH OR IMMEDIATELY FOLLOWING A MEAL (Patient taking differently: Take 50 mg by mouth daily.), Disp: 90 tablet, Rfl: 0   ONETOUCH VERIO test strip, USE 1 STRIP TO CHECK  GLUCOSE ONCE DAILY, Disp: 50 each, Rfl: 0   rosuvastatin (CRESTOR) 40 MG tablet, Take 1 tablet (40 mg total) by mouth daily., Disp: 90 tablet, Rfl: 3   spironolactone (ALDACTONE) 25 MG tablet, Take 1 tablet by mouth once daily (Patient taking differently: Take 25 mg by mouth daily.), Disp: 90 tablet, Rfl: 0

## 2022-06-19 NOTE — Patient Instructions (Signed)
Complete your course of anitbiotics as planned.  We will check a chest x-ray today  We will check a CT Chest scan in 1 month  Follow up in 4-6 weeks after CT Chest scan

## 2022-06-20 DIAGNOSIS — Z87891 Personal history of nicotine dependence: Secondary | ICD-10-CM | POA: Diagnosis not present

## 2022-06-20 DIAGNOSIS — N179 Acute kidney failure, unspecified: Secondary | ICD-10-CM | POA: Diagnosis not present

## 2022-06-20 DIAGNOSIS — R918 Other nonspecific abnormal finding of lung field: Secondary | ICD-10-CM | POA: Diagnosis not present

## 2022-06-20 DIAGNOSIS — R6520 Severe sepsis without septic shock: Secondary | ICD-10-CM | POA: Diagnosis not present

## 2022-06-20 DIAGNOSIS — A419 Sepsis, unspecified organism: Secondary | ICD-10-CM | POA: Diagnosis not present

## 2022-06-20 DIAGNOSIS — I10 Essential (primary) hypertension: Secondary | ICD-10-CM | POA: Diagnosis not present

## 2022-06-20 DIAGNOSIS — Z7984 Long term (current) use of oral hypoglycemic drugs: Secondary | ICD-10-CM | POA: Diagnosis not present

## 2022-06-20 DIAGNOSIS — Z9181 History of falling: Secondary | ICD-10-CM | POA: Diagnosis not present

## 2022-06-20 DIAGNOSIS — J9601 Acute respiratory failure with hypoxia: Secondary | ICD-10-CM | POA: Diagnosis not present

## 2022-06-20 DIAGNOSIS — I69351 Hemiplegia and hemiparesis following cerebral infarction affecting right dominant side: Secondary | ICD-10-CM | POA: Diagnosis not present

## 2022-06-20 DIAGNOSIS — E1140 Type 2 diabetes mellitus with diabetic neuropathy, unspecified: Secondary | ICD-10-CM | POA: Diagnosis not present

## 2022-06-20 DIAGNOSIS — N390 Urinary tract infection, site not specified: Secondary | ICD-10-CM | POA: Diagnosis not present

## 2022-06-20 DIAGNOSIS — E782 Mixed hyperlipidemia: Secondary | ICD-10-CM | POA: Diagnosis not present

## 2022-06-26 ENCOUNTER — Telehealth: Payer: Self-pay

## 2022-06-26 DIAGNOSIS — I10 Essential (primary) hypertension: Secondary | ICD-10-CM | POA: Diagnosis not present

## 2022-06-26 DIAGNOSIS — Z87891 Personal history of nicotine dependence: Secondary | ICD-10-CM | POA: Diagnosis not present

## 2022-06-26 DIAGNOSIS — R918 Other nonspecific abnormal finding of lung field: Secondary | ICD-10-CM | POA: Diagnosis not present

## 2022-06-26 DIAGNOSIS — N179 Acute kidney failure, unspecified: Secondary | ICD-10-CM | POA: Diagnosis not present

## 2022-06-26 DIAGNOSIS — R6520 Severe sepsis without septic shock: Secondary | ICD-10-CM | POA: Diagnosis not present

## 2022-06-26 DIAGNOSIS — A419 Sepsis, unspecified organism: Secondary | ICD-10-CM | POA: Diagnosis not present

## 2022-06-26 DIAGNOSIS — Z7984 Long term (current) use of oral hypoglycemic drugs: Secondary | ICD-10-CM | POA: Diagnosis not present

## 2022-06-26 DIAGNOSIS — Z9181 History of falling: Secondary | ICD-10-CM | POA: Diagnosis not present

## 2022-06-26 DIAGNOSIS — E782 Mixed hyperlipidemia: Secondary | ICD-10-CM | POA: Diagnosis not present

## 2022-06-26 DIAGNOSIS — N390 Urinary tract infection, site not specified: Secondary | ICD-10-CM | POA: Diagnosis not present

## 2022-06-26 DIAGNOSIS — I69351 Hemiplegia and hemiparesis following cerebral infarction affecting right dominant side: Secondary | ICD-10-CM | POA: Diagnosis not present

## 2022-06-26 DIAGNOSIS — J9601 Acute respiratory failure with hypoxia: Secondary | ICD-10-CM | POA: Diagnosis not present

## 2022-06-26 DIAGNOSIS — E1140 Type 2 diabetes mellitus with diabetic neuropathy, unspecified: Secondary | ICD-10-CM | POA: Diagnosis not present

## 2022-06-26 NOTE — Telephone Encounter (Signed)
This nurse called patient for scheduled telephonic AWV. She stated that she was having a visit.. Told her we'd call back to reschedule for another time.

## 2022-06-28 DIAGNOSIS — J9601 Acute respiratory failure with hypoxia: Secondary | ICD-10-CM | POA: Diagnosis not present

## 2022-06-28 DIAGNOSIS — I69351 Hemiplegia and hemiparesis following cerebral infarction affecting right dominant side: Secondary | ICD-10-CM | POA: Diagnosis not present

## 2022-06-28 DIAGNOSIS — N390 Urinary tract infection, site not specified: Secondary | ICD-10-CM | POA: Diagnosis not present

## 2022-06-28 DIAGNOSIS — A419 Sepsis, unspecified organism: Secondary | ICD-10-CM | POA: Diagnosis not present

## 2022-06-28 DIAGNOSIS — R6520 Severe sepsis without septic shock: Secondary | ICD-10-CM | POA: Diagnosis not present

## 2022-06-28 DIAGNOSIS — Z7984 Long term (current) use of oral hypoglycemic drugs: Secondary | ICD-10-CM | POA: Diagnosis not present

## 2022-06-28 DIAGNOSIS — N179 Acute kidney failure, unspecified: Secondary | ICD-10-CM | POA: Diagnosis not present

## 2022-06-28 DIAGNOSIS — R918 Other nonspecific abnormal finding of lung field: Secondary | ICD-10-CM | POA: Diagnosis not present

## 2022-06-28 DIAGNOSIS — E782 Mixed hyperlipidemia: Secondary | ICD-10-CM | POA: Diagnosis not present

## 2022-06-28 DIAGNOSIS — Z9181 History of falling: Secondary | ICD-10-CM | POA: Diagnosis not present

## 2022-06-28 DIAGNOSIS — I10 Essential (primary) hypertension: Secondary | ICD-10-CM | POA: Diagnosis not present

## 2022-06-28 DIAGNOSIS — Z87891 Personal history of nicotine dependence: Secondary | ICD-10-CM | POA: Diagnosis not present

## 2022-06-28 DIAGNOSIS — E1140 Type 2 diabetes mellitus with diabetic neuropathy, unspecified: Secondary | ICD-10-CM | POA: Diagnosis not present

## 2022-07-02 DIAGNOSIS — E1140 Type 2 diabetes mellitus with diabetic neuropathy, unspecified: Secondary | ICD-10-CM | POA: Diagnosis not present

## 2022-07-02 DIAGNOSIS — R6520 Severe sepsis without septic shock: Secondary | ICD-10-CM | POA: Diagnosis not present

## 2022-07-02 DIAGNOSIS — N390 Urinary tract infection, site not specified: Secondary | ICD-10-CM | POA: Diagnosis not present

## 2022-07-02 DIAGNOSIS — Z7984 Long term (current) use of oral hypoglycemic drugs: Secondary | ICD-10-CM | POA: Diagnosis not present

## 2022-07-02 DIAGNOSIS — J9601 Acute respiratory failure with hypoxia: Secondary | ICD-10-CM | POA: Diagnosis not present

## 2022-07-02 DIAGNOSIS — Z9181 History of falling: Secondary | ICD-10-CM | POA: Diagnosis not present

## 2022-07-02 DIAGNOSIS — N179 Acute kidney failure, unspecified: Secondary | ICD-10-CM | POA: Diagnosis not present

## 2022-07-02 DIAGNOSIS — I69351 Hemiplegia and hemiparesis following cerebral infarction affecting right dominant side: Secondary | ICD-10-CM | POA: Diagnosis not present

## 2022-07-02 DIAGNOSIS — R918 Other nonspecific abnormal finding of lung field: Secondary | ICD-10-CM | POA: Diagnosis not present

## 2022-07-02 DIAGNOSIS — E782 Mixed hyperlipidemia: Secondary | ICD-10-CM | POA: Diagnosis not present

## 2022-07-02 DIAGNOSIS — I10 Essential (primary) hypertension: Secondary | ICD-10-CM | POA: Diagnosis not present

## 2022-07-02 DIAGNOSIS — Z87891 Personal history of nicotine dependence: Secondary | ICD-10-CM | POA: Diagnosis not present

## 2022-07-02 DIAGNOSIS — A419 Sepsis, unspecified organism: Secondary | ICD-10-CM | POA: Diagnosis not present

## 2022-07-09 ENCOUNTER — Other Ambulatory Visit: Payer: Self-pay | Admitting: Family Medicine

## 2022-07-09 ENCOUNTER — Telehealth: Payer: Self-pay | Admitting: Pulmonary Disease

## 2022-07-09 DIAGNOSIS — Z7984 Long term (current) use of oral hypoglycemic drugs: Secondary | ICD-10-CM | POA: Diagnosis not present

## 2022-07-09 DIAGNOSIS — I10 Essential (primary) hypertension: Secondary | ICD-10-CM | POA: Diagnosis not present

## 2022-07-09 DIAGNOSIS — E782 Mixed hyperlipidemia: Secondary | ICD-10-CM | POA: Diagnosis not present

## 2022-07-09 DIAGNOSIS — R6520 Severe sepsis without septic shock: Secondary | ICD-10-CM | POA: Diagnosis not present

## 2022-07-09 DIAGNOSIS — A419 Sepsis, unspecified organism: Secondary | ICD-10-CM | POA: Diagnosis not present

## 2022-07-09 DIAGNOSIS — E1140 Type 2 diabetes mellitus with diabetic neuropathy, unspecified: Secondary | ICD-10-CM | POA: Diagnosis not present

## 2022-07-09 DIAGNOSIS — J9601 Acute respiratory failure with hypoxia: Secondary | ICD-10-CM | POA: Diagnosis not present

## 2022-07-09 DIAGNOSIS — Z87891 Personal history of nicotine dependence: Secondary | ICD-10-CM | POA: Diagnosis not present

## 2022-07-09 DIAGNOSIS — N390 Urinary tract infection, site not specified: Secondary | ICD-10-CM | POA: Diagnosis not present

## 2022-07-09 DIAGNOSIS — Z9181 History of falling: Secondary | ICD-10-CM | POA: Diagnosis not present

## 2022-07-09 DIAGNOSIS — I69351 Hemiplegia and hemiparesis following cerebral infarction affecting right dominant side: Secondary | ICD-10-CM | POA: Diagnosis not present

## 2022-07-09 DIAGNOSIS — R918 Other nonspecific abnormal finding of lung field: Secondary | ICD-10-CM | POA: Diagnosis not present

## 2022-07-09 DIAGNOSIS — N179 Acute kidney failure, unspecified: Secondary | ICD-10-CM | POA: Diagnosis not present

## 2022-07-09 NOTE — Telephone Encounter (Signed)
Please schedule patient for clinic visit in the afternoon after her CT chest scan at 11:30am.   I want to review this in person with her in case we need to consider taking her for bronchoscopy and biopsies.  Thanks, JD

## 2022-07-10 NOTE — Telephone Encounter (Signed)
Hey ladies,   Can we call this patient and get her scheduled to see Dr Erin Fulling in the afternoon after her CT scan that is done 07/26/2022 at 11:30.   I would do it a day or two after the scan to make sure that he can see the image.   Thank you ladies.

## 2022-07-10 NOTE — Telephone Encounter (Signed)
Appointment is 08/01/22 at 11:45am

## 2022-07-16 ENCOUNTER — Ambulatory Visit (INDEPENDENT_AMBULATORY_CARE_PROVIDER_SITE_OTHER): Payer: Medicare Other

## 2022-07-16 VITALS — Ht 65.0 in | Wt 222.0 lb

## 2022-07-16 DIAGNOSIS — Z0000 Encounter for general adult medical examination without abnormal findings: Secondary | ICD-10-CM | POA: Diagnosis not present

## 2022-07-16 NOTE — Patient Instructions (Addendum)
[name] , Thank you for taking time to come for your Medicare Wellness Visit. I appreciate your ongoing commitment to your health goals. Please review the following plan we discussed and let me know if I can assist you in the future.   These are the goals we discussed:  Goals       [facility]) Monitor and Manage My Blood Sugar-Diabetes Type 2      Barriers: Health Behaviors Knowledge Timeframe:  Long-Range Goal Priority:  High Start Date: 12/15/20                           Expected End Date: 11/25/21              Follow Up Date 11/24/21 - check blood sugar at prescribed times - check blood sugar if I feel it is too high or too low - enter blood sugar readings and medication or insulin into daily log -Discussed limiting sugar and carbohydrates in patient's diet -Encouraged patient to not sit for long periods and to continue to get up often to do house-hold tasks   Why is this important?   Checking your blood sugar at home helps to keep it from getting very high or very low.  Writing the results in a diary or log helps the doctor know how to care for you.  Your blood sugar log should have the time, date and the results.  Also, write down the amount of insulin or other medicine that you take.  Other information, like what you ate, exercise done and how you were feeling, will also be helpful.     Notes: 08/15/21: Patient reports that her blood sugar meter is not working and states she has not taken her blood sugar in several weeks. Nurse called the patient's PCP's office and spoke to [place] who states she will get an order for a glucometer and send it to the patient's pharmacy for pickup. Patient's explains that her A1c has increased to 9.6. She wants to work towards lowering her A1c. Patient states she has begun to limit the amount of bread, rice, noodles, sweets and junk food that she was eating previously. Nurse will send patient a planning healthy meals booklet.  12/15/20: Patient states she  is taking her blood sugar 1-2 times daily and she does record the values.  05/16/21 Patient reports blood sugars have been in the 200's some but less than 300. Admits to not following diet at times.  Encouraged to limit sweets and carbohydrates.   Diabetes Management Discussed: Medication adherence Reviewed importance of limiting carbs such as rice, potatoes, breads, and pastas. Also discussed limiting sweets and sugary drinks.  Discussed importance of portion control.  Also discussed importance of annual exams, foot exams, and eye exams.         Stay Healthy (pt-stated)        This is a list of the screening recommended for you and due dates:  Health Maintenance  Topic Date Due   Complete foot exam   12/27/2018   Flu Shot  06/26/2022   COVID-19 Vaccine (3 - Pfizer series) 08/01/2022*   Zoster (Shingles) Vaccine (1 of 2) 10/16/2022*   Pneumonia Vaccine (2 - PPSV23 or PCV20) 07/17/2023*   Cologuard (Stool DNA test)  07/17/2023*   Mammogram  10/12/2022   Eye exam for diabetics  10/17/2022   Hemoglobin A1C  11/13/2022   Tetanus Vaccine  07/17/2027   DEXA scan (bone density  measurement)  Completed   Hepatitis C Screening: USPSTF Recommendation to screen - Ages 103-79 yo.  Completed   HPV Vaccine  Aged Out   Colon Cancer Screening  Discontinued  *Topic was postponed. The date shown is not the original due date.    Advanced directives: No  Conditions/risks identified: None  Next appointment: Follow up in one year for your annual wellness visit     Preventive Care 65 Years and Older, Female Preventive care refers to lifestyle choices and visits with your health care provider that can promote health and wellness. What does preventive care include? A yearly physical exam. This is also called an annual well check. Dental exams once or twice a year. Routine eye exams. Ask your health care provider how often you should have your eyes checked. Personal lifestyle choices,  including: Daily care of your teeth and gums. Regular physical activity. Eating a healthy diet. Avoiding tobacco and drug use. Limiting alcohol use. Practicing safe sex. Taking low-dose aspirin every day. Taking vitamin and mineral supplements as recommended by your health care provider. What happens during an annual well check? The services and screenings done by your health care provider during your annual well check will depend on your age, overall health, lifestyle risk factors, and family history of disease. Counseling  Your health care provider may ask you questions about your: Alcohol use. Tobacco use. Drug use. Emotional well-being. Home and relationship well-being. Sexual activity. Eating habits. History of falls. Memory and ability to understand (cognition). Work and work Statistician. Reproductive health. Screening  You may have the following tests or measurements: Height, weight, and BMI. Blood pressure. Lipid and cholesterol levels. These may be checked every 5 years, or more frequently if you are over 48 years old. Skin check. Lung cancer screening. You may have this screening every year starting at age 72 if you have a 30-pack-year history of smoking and currently smoke or have quit within the past 15 years. Fecal occult blood test (FOBT) of the stool. You may have this test every year starting at age 36. Flexible sigmoidoscopy or colonoscopy. You may have a sigmoidoscopy every 5 years or a colonoscopy every 10 years starting at age 36. Hepatitis C blood test. Hepatitis B blood test. Sexually transmitted disease (STD) testing. Diabetes screening. This is done by checking your blood sugar (glucose) after you have not eaten for a while (fasting). You may have this done every 1-3 years. Bone density scan. This is done to screen for osteoporosis. You may have this done starting at age 65. Mammogram. This may be done every 1-2 years. Talk to your health care provider  about how often you should have regular mammograms. Talk with your health care provider about your test results, treatment options, and if necessary, the need for more tests. Vaccines  Your health care provider may recommend certain vaccines, such as: Influenza vaccine. This is recommended every year. Tetanus, diphtheria, and acellular pertussis (Tdap, Td) vaccine. You may need a Td booster every 10 years. Zoster vaccine. You may need this after age 5. Pneumococcal 13-valent conjugate (PCV13) vaccine. One dose is recommended after age 37. Pneumococcal polysaccharide (PPSV23) vaccine. One dose is recommended after age 54. Talk to your health care provider about which screenings and vaccines you need and how often you need them. This information is not intended to replace advice given to you by your health care provider. Make sure you discuss any questions you have with your health care provider. Document Released: 12/09/2015  Document Revised: 08/01/2016 Document Reviewed: 09/13/2015 Elsevier Interactive Patient Education  2017 [place] Prevention in the Home Falls can cause injuries. They can happen to people of all ages. There are many things you can do to make your home safe and to help prevent falls. What can I do on the outside of my home? Regularly fix the edges of walkways and driveways and fix any cracks. Remove anything that might make you trip as you walk through a door, such as a raised step or threshold. Trim any bushes or trees on the path to your home. Use bright outdoor lighting. Clear any walking paths of anything that might make someone trip, such as rocks or tools. Regularly check to see if handrails are loose or broken. Make sure that both sides of any steps have handrails. Any raised decks and porches should have guardrails on the edges. Have any leaves, snow, or ice cleared regularly. Use sand or salt on walking paths during winter. Clean up any spills in  your garage right away. This includes oil or grease spills. What can I do in the bathroom? Use night lights. Install grab bars by the toilet and in the tub and shower. Do not use towel bars as grab bars. Use non-skid mats or decals in the tub or shower. If you need to sit down in the shower, use a plastic, non-slip stool. Keep the floor dry. Clean up any water that spills on the floor as soon as it happens. Remove soap buildup in the tub or shower regularly. Attach bath mats securely with double-sided non-slip rug tape. Do not have throw rugs and other things on the floor that can make you trip. What can I do in the bedroom? Use night lights. Make sure that you have a light by your bed that is easy to reach. Do not use any sheets or blankets that are too big for your bed. They should not hang down onto the floor. Have a firm chair that has side arms. You can use this for support while you get dressed. Do not have throw rugs and other things on the floor that can make you trip. What can I do in the kitchen? Clean up any spills right away. Avoid walking on wet floors. Keep items that you use a lot in easy-to-reach places. If you need to reach something above you, use a strong step stool that has a grab bar. Keep electrical cords out of the way. Do not use floor polish or wax that makes floors slippery. If you must use wax, use non-skid floor wax. Do not have throw rugs and other things on the floor that can make you trip. What can I do with my stairs? Do not leave any items on the stairs. Make sure that there are handrails on both sides of the stairs and use them. Fix handrails that are broken or loose. Make sure that handrails are as long as the stairways. Check any carpeting to make sure that it is firmly attached to the stairs. Fix any carpet that is loose or worn. Avoid having throw rugs at the top or bottom of the stairs. If you do have throw rugs, attach them to the floor with carpet  tape. Make sure that you have a light switch at the top of the stairs and the bottom of the stairs. If you do not have them, ask someone to add them for you. What else can I do to help prevent falls?  Wear shoes that: Do not have high heels. Have rubber bottoms. Are comfortable and fit you well. Are closed at the toe. Do not wear sandals. If you use a stepladder: Make sure that it is fully opened. Do not climb a closed stepladder. Make sure that both sides of the stepladder are locked into place. Ask someone to hold it for you, if possible. Clearly mark and make sure that you can see: Any grab bars or handrails. First and last steps. Where the edge of each step is. Use tools that help you move around (mobility aids) if they are needed. These include: Canes. Walkers. Scooters. Crutches. Turn on the lights when you go into a dark area. Replace any light bulbs as soon as they burn out. Set up your furniture so you have a clear path. Avoid moving your furniture around. If any of your floors are uneven, fix them. If there are any pets around you, be aware of where they are. Review your medicines with your doctor. Some medicines can make you feel dizzy. This can increase your chance of falling. Ask your doctor what other things that you can do to help prevent falls. This information is not intended to replace advice given to you by your health care provider. Make sure you discuss any questions you have with your health care provider. Document Released: 09/08/2009 Document Revised: 04/19/2016 Document Reviewed: 12/17/2014 Elsevier Interactive Patient Education  2017 [employer].

## 2022-07-16 NOTE — Progress Notes (Signed)
Subjective:   [name] is a 76 y.o. female who presents for Medicare Annual (Subsequent) preventive examination.  Review of Systems    Virtual Visit via Telephone Note  I connected with  [name] on 07/16/22 at  8:45 AM EDT by telephone and verified that I am speaking with the correct person using two identifiers.  Location: Patient: Home Provider: Office Persons participating in the virtual visit: patient/Nurse Health Advisor   I discussed the limitations, risks, security and privacy concerns of performing an evaluation and management service by telephone and the availability of in person appointments. The patient expressed understanding and agreed to proceed.  Interactive audio and video telecommunications were attempted between this nurse and patient, however failed, due to patient having technical difficulties OR patient did not have access to video capability.  We continued and completed visit with audio only.  Some vital signs may be absent or patient reported.   Criselda Peaches, LPN  Cardiac Risk Factors include: advanced age (>80mn, >>12women);hypertension;diabetes mellitus     Objective:    Today's Vitals   07/16/22 0859  Weight: 222 lb (100.7 kg)  Height: '5\' 5"'  (1.651 m)   Body mass index is 36.94 kg/m.     07/16/2022    9:09 AM 05/20/2022   10:49 PM 11/14/2021    2:10 PM 08/15/2021    1:50 PM 06/23/2021    8:23 AM 05/16/2021   10:19 AM 06/08/2020   10:08 AM  Advanced Directives  Does Patient Have a Medical Advance Directive? No No No No Yes No Yes  Type of APersonnel officerLiving will  [place] will  Does patient want to make changes to medical advance directive?       No - Patient declined  Copy of [place] Chart?     No - copy requested  No - copy requested  Would patient like information on creating a medical advance directive? No - Patient declined No -  Patient declined No - Patient declined No - Patient declined  No - Patient declined     Current Medications (verified) Outpatient Encounter Medications as of 07/16/2022  Medication Sig   albuterol (VENTOLIN HFA) 108 (90 Base) MCG/ACT inhaler Inhale 2 puffs into the lungs every 6 (six) hours as needed for wheezing or shortness of breath.   Alcohol Swabs (ALCOHOL PREP) PADS Use to test 1 X day and diagnosis code is E 11.9   amLODipine (NORVASC) 5 MG tablet Take 1 tablet by mouth once daily   aspirin 81 MG chewable tablet Chew 1 tablet (81 mg total) by mouth daily.   Blood Glucose Monitoring Suppl (ONETOUCH VERIO) w/Device KIT Use to check blood sugar two times daily   diclofenac (VOLTAREN) 75 MG EC tablet Take 1 tablet (75 mg total) by mouth 2 (two) times daily.   empagliflozin (JARDIANCE) 25 MG TABS tablet Take 1 tablet (25 mg total) by mouth daily before breakfast.   furosemide (LASIX) 20 MG tablet Take 1 tablet by mouth once daily (Patient taking differently: Take 20 mg by mouth daily.)   glipiZIDE (GLUCOTROL) 10 MG tablet Take 1 tablet (10 mg total) by mouth 2 (two) times daily before a meal.   Lancets (ONETOUCH ULTRASOFT) lancets USE   TO CHECK GLUCOSE ONCE DAILY   lisinopril (ZESTRIL) 10 MG tablet Take 2 tablets by mouth once daily   metFORMIN (GLUCOPHAGE) 500 MG tablet Take  1 tablet (500 mg total) by mouth 2 (two) times daily with a meal.   metoprolol succinate (TOPROL-XL) 50 MG 24 hr tablet TAKE ONE TABLET BY MOUTH WITH OR IMMEDIATELY FOLLOWING A MEAL (Patient taking differently: Take 50 mg by mouth daily.)   ONETOUCH VERIO test strip USE 1 STRIP TO CHECK GLUCOSE ONCE DAILY   rosuvastatin (CRESTOR) 40 MG tablet Take 1 tablet (40 mg total) by mouth daily.   spironolactone (ALDACTONE) 25 MG tablet Take 1 tablet by mouth once daily   No facility-administered encounter medications on file as of 07/16/2022.    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    Hx raising of her deceased sister's 3 children, and is now raising/supporting 10yo  'great-grandson'.    Attends church, church community as support system. Many of her family has passed away.   Enjoys watching TV; used to enjoy going fishing         Social Determinants of Health    Financial Resource Strain: Low Risk  (07/16/2022)   Overall Financial Resource Strain (CARDIA)    Difficulty of Paying Living Expenses: Not hard at all  Food Insecurity: No Food Insecurity (07/16/2022)   Hunger Vital Sign    Worried About Running Out of Food in the Last Year: Never true    Ran Out of Food in the Last Year: Never true  Transportation Needs: No Transportation Needs (07/16/2022)  PRAPARE - Hydrologist (Medical): No    Lack of Transportation (Non-Medical): No  Physical Activity: Inactive (07/16/2022)   Exercise Vital Sign    Days of Exercise per Week: 0 days    Minutes of Exercise per Session: 0 min  Stress: No Stress Concern Present (07/16/2022)   [place]    Feeling of Stress : Not at all  Social Connections: Moderately Integrated (07/16/2022)   Social Connection and Isolation Panel [NHANES]    Frequency of Communication with Friends and Family: More than three times a week    Frequency of Social Gatherings with Friends and Family: More than three times a week    Attends Religious Services: More than 4 times per year    Active Member of [employer] or Organizations: Yes    Attends Archivist Meetings: More than 4 times per year    Marital Status: Widowed    Tobacco Counseling Counseling given: Not Answered Tobacco comments: trying to quit;  not really motivated at present   Clinical Intake:  Pre-visit preparation completed: NoNutrition Risk Assessment:  Has the patient had any N/V/D within the last 2 months?  No  Does the patient have any non-healing wounds?  No  Has the patient had any unintentional weight loss or weight gain?  No   Diabetes:  Is the patient diabetic?  Yes  If diabetic, was a CBG obtained today?  No  Did the patient bring in their glucometer from home?  No  How often do you monitor your CBG's? 2 X Daily.   Financial Strains and Diabetes  Management:  Are you having any financial strains with the device, your supplies or your medication? No .  Does the patient want to be seen by Chronic Care Management for management of their diabetes?  No  Would the patient like to be referred to a Nutritionist or for Diabetic Management?  No   Diabetic Exams:  Diabetic Eye Exam: Completed No. Overdue for diabetic eye exam. Pt has been advised about the importance in completing this exam. A referral has been placed today. Message sent to referral coordinator for scheduling purposes. Advised pt to expect a call from office referred to regarding appt.  Diabetic Foot Exam: Completed No. Pt has been advised about the importance in completing this exam. Pt is scheduled for diabetic foot exam on Followed by PCP.    Diabetic?  Yes  Interpreter Needed?: No ctivities of Daily Living    07/16/2022    9:06 AM 05/20/2022   10:52 PM  In your present state of health, do you have any difficulty performing the following activities:  Hearing? 0   Vision? 0   Difficulty concentrating or making decisions? 0   Walking or climbing stairs? 0   Dressing or bathing? 0   Doing errands, shopping? 0 0  Preparing Food and eating ? N   Using the Toilet? N   In the past six months, have you accidently leaked urine? N   Do you have problems with loss of bowel control? N   Managing your Medications? N   Managing your Finances? N   Housekeeping or managing your Housekeeping? N     Patient Care Team: Laurey Morale, MD as PCP - [facility]  Indicate any recent Medical Services you may have received from other than [facility] providers in the past year (date may be approximate).  Assessment:   This is a routine wellness examination for Juel.  Hearing/Vision screen Hearing Screening - Comments:: No hearing difficulty Vision Screening - Comments:: Wears glasses. Followed by Dr Schuyler Amor  Dietary issues and exercise activities  discussed: Exercise limited by: None identified   Goals Addressed               This Visit's Progress     Stay Healthy (pt-stated)         Depression Screen    07/16/2022    9:04 AM 06/01/2022    1:10 PM 03/05/2022   12:13 PM 02/06/2022   11:21 AM 11/14/2021    1:58 PM 08/15/2021    4:25 PM 06/23/2021    8:26 AM  PHQ 2/9 Scores  PHQ - 2 Score 0 0 0 0 0 0 0  PHQ- 9 Score 0 0         Fall Risk    07/16/2022    9:07 AM 06/07/2022    3:25 PM 06/01/2022    1:10 PM 03/05/2022   11:14 AM 02/06/2022   11:21 AM  Fall Risk   Falls in the past year? 1 1 0 1 0  Number falls in past yr: 0 0 0 0 0  Injury with Fall? 0 0 0 0 0  Risk for fall due to : No Fall Risks Impaired balance/gait;Impaired mobility History of fall(s) History of fall(s);Impaired balance/gait;Impaired mobility No Fall Risks  Follow up  Falls evaluation completed Falls evaluation completed Falls prevention discussed;Education provided;Falls evaluation completed     FALL RISK PREVENTION PERTAINING TO THE HOME:  Any stairs in or around the home? Yes  If so, are there any without handrails? No  Home free of loose throw rugs in walkways, pet beds, electrical cords, etc? Yes  Adequate lighting in your home to reduce risk of falls? Yes   ASSISTIVE DEVICES UTILIZED TO PREVENT FALLS:  Life alert? Yes  Use of a cane, walker or w/c? Yes  Grab bars in the bathroom? No  Shower chair or bench in shower? Yes  Elevated toilet seat or a handicapped toilet? Yes   TIMED UP AND GO:  Was the test performed? No . Audio Visit   Cognitive Function:        07/16/2022    9:09 AM 06/08/2020   10:15 AM  6CIT Screen  What Year? 0 points 0 points  What month? 0 points 0 points  What time? 0 points 0 points  Count back from 20 0 points 0 points  Months in reverse 0 points 0 points  Repeat phrase 0 points 0 points  Total Score 0 points 0 points    Immunizations Immunization History  Administered Date(s) Administered   Fluad  Quad(high Dose 65+) 08/18/2019   Influenza, High Dose Seasonal PF 10/17/2015, 11/28/2016, 12/24/2017, 01/23/2019   Influenza, Seasonal, Injecte, Preservative Fre 12/01/2012   Influenza,inj,Quad PF,6+ Mos 09/07/2013, 10/11/2014   PFIZER(Purple Top)SARS-COV-2 Vaccination 01/28/2020, 02/24/2020   Pneumococcal Conjugate-13 08/18/2019   Td 12/22/2007   Tdap 07/16/2017    TDAP status: Up to date  Flu Vaccine status: Due, Education has been provided regarding the importance of this vaccine. Advised may receive this vaccine at local pharmacy or Health Dept. Aware to provide a copy of the vaccination record if obtained from local pharmacy or Health Dept. Verbalized acceptance and understanding.  Pneumococcal vaccine status: Up to date  Covid-19 vaccine status: Completed vaccines  Qualifies for Shingles Vaccine? Yes   Zostavax completed No  Shingrix Completed?: No.    Education has been provided regarding the importance of this vaccine. Patient has been advised to call insurance company to determine out of pocket expense if they have not yet received this vaccine. Advised may also receive vaccine at local pharmacy or Health Dept. Verbalized acceptance and understanding.  Screening Tests Health Maintenance  Topic Date Due   FOOT EXAM  12/27/2018   INFLUENZA VACCINE  06/26/2022   COVID-19 Vaccine (3 - Pfizer series) 08/01/2022 (Originally 04/20/2020)   Zoster Vaccines- Shingrix (1 of 2) 10/16/2022 (Originally 12/14/1996)   Pneumonia Vaccine 75+ Years old (2 - PPSV23 or PCV20) 07/17/2023 (Originally 08/17/2020)   Fecal DNA (Cologuard)  07/17/2023 (Originally 12/15/1991)   MAMMOGRAM  10/12/2022   OPHTHALMOLOGY EXAM  10/17/2022   HEMOGLOBIN A1C  11/13/2022   TETANUS/TDAP  07/17/2027   DEXA SCAN  Completed   Hepatitis C Screening  Completed   HPV VACCINES  Aged Out   COLONOSCOPY (Pts 45-46yr Insurance coverage will need to be confirmed)  Discontinued    Health Maintenance  Health Maintenance  Due  Topic Date Due   FOOT EXAM  12/27/2018   INFLUENZA VACCINE  06/26/2022    Colorectal cancer screening: Referral to GI placed Patient deferred. Pt aware the office will call re: appt.  Mammogram status: No longer required due to Age.  Bone Density status: Completed 10/30/21. Results reflect: Bone density results: OSTEOPOROSIS. Repeat every   years.  Lung Cancer Screening: (Low Dose CT Chest recommended if Age 75-80years, 30 pack-year currently smoking OR have quit w/in 15years.) does not qualify.     Additional Screening:  Hepatitis C Screening: does qualify; Completed 09/15/21  Vision Screening: Recommended annual ophthalmology exams for early detection of glaucoma and other disorders of the eye. Is the patient up to date with their annual eye exam?  Yes  Who is the provider or what is the name of the office in which the patient attends annual eye exams? Dr GSchuyler AmorIf pt is not established with a provider, would they like to be referred to a provider to establish care? No .   Dental Screening: Recommended annual dental exams for proper oral hygiene  Community Resource Referral / Chronic Care Management:  CRR required this visit?  No   CCM required this visit?  No      Plan:     I have personally reviewed and noted the following in the patient's chart:   Medical and social history Use of alcohol, tobacco or illicit drugs  Current medications and supplements including opioid prescriptions. Patient is not currently taking opioid prescriptions. Functional ability and status Nutritional status Physical activity Advanced directives List of other physicians Hospitalizations, surgeries, and ER visits in previous 12 months Vitals Screenings to include cognitive, depression, and falls Referrals and appointments  In addition, I have reviewed and discussed with patient certain preventive protocols, quality metrics, and best practice recommendations. A written personalized  care plan for preventive services as well as general preventive health recommendations were provided to patient.     BCriselda Peaches LPN   86/96/7893  Nurse Notes: None

## 2022-07-20 ENCOUNTER — Other Ambulatory Visit: Payer: Self-pay | Admitting: Family Medicine

## 2022-07-26 ENCOUNTER — Ambulatory Visit (HOSPITAL_BASED_OUTPATIENT_CLINIC_OR_DEPARTMENT_OTHER)
Admission: RE | Admit: 2022-07-26 | Discharge: 2022-07-26 | Disposition: A | Discharge status: Home / Self Care | Payer: Medicare Other | Source: Ambulatory Visit | Attending: Pulmonary Disease | Admitting: Pulmonary Disease

## 2022-07-26 ENCOUNTER — Encounter (HOSPITAL_BASED_OUTPATIENT_CLINIC_OR_DEPARTMENT_OTHER): Payer: Self-pay

## 2022-07-26 DIAGNOSIS — I3139 Other pericardial effusion (noninflammatory): Secondary | ICD-10-CM | POA: Diagnosis not present

## 2022-07-26 DIAGNOSIS — I2510 Atherosclerotic heart disease of native coronary artery without angina pectoris: Secondary | ICD-10-CM | POA: Insufficient documentation

## 2022-07-26 DIAGNOSIS — J984 Other disorders of lung: Secondary | ICD-10-CM | POA: Insufficient documentation

## 2022-07-26 DIAGNOSIS — R911 Solitary pulmonary nodule: Secondary | ICD-10-CM | POA: Diagnosis not present

## 2022-07-26 DIAGNOSIS — I700 Atherosclerosis of aorta: Secondary | ICD-10-CM | POA: Diagnosis not present

## 2022-07-26 DIAGNOSIS — R918 Other nonspecific abnormal finding of lung field: Secondary | ICD-10-CM | POA: Insufficient documentation

## 2022-07-26 DIAGNOSIS — I517 Cardiomegaly: Secondary | ICD-10-CM | POA: Insufficient documentation

## 2022-07-26 DIAGNOSIS — J9811 Atelectasis: Secondary | ICD-10-CM | POA: Diagnosis not present

## 2022-08-01 ENCOUNTER — Telehealth: Payer: Self-pay | Admitting: Student

## 2022-08-01 ENCOUNTER — Ambulatory Visit (INDEPENDENT_AMBULATORY_CARE_PROVIDER_SITE_OTHER): Payer: Medicare Other | Admitting: Pulmonary Disease

## 2022-08-01 ENCOUNTER — Encounter: Payer: Self-pay | Admitting: Pulmonary Disease

## 2022-08-01 VITALS — BP 140/72 | HR 70 | Temp 98.5°F | Ht 66.0 in | Wt 218.2 lb

## 2022-08-01 DIAGNOSIS — C3490 Malignant neoplasm of unspecified part of unspecified bronchus or lung: Secondary | ICD-10-CM

## 2022-08-01 DIAGNOSIS — J984 Other disorders of lung: Secondary | ICD-10-CM

## 2022-08-01 NOTE — Patient Instructions (Signed)
We will schedule you for navigation bronchoscopy in the next week or two with Dr. Valeta Harms, Dr. Lamonte Sakai or Dr. Verlee Monte.   We will schedule you for a PET CT scan at [facility]  Follow up in 3 months

## 2022-08-01 NOTE — Progress Notes (Signed)
Synopsis: Referred in July 2023 for hospital follow up for pneumonia  Subjective:   PATIENT ID: [name] GENDER: female DOB: 1946-09-19, MRN: [id number]  HPI  Chief Complaint  Patient presents with   Follow-up    Pt is here today to go over CT scan results    [name] is a 76 year old woman, former smoker with hypertension, CVA with residual right sided weakness and DMII who returns to pulmonary clinic for follow up of RUL cavitary lesion.   Repeat CT Chest scan 07/26/22 shows increase in RUL cavitary lesion size. She denies cough or hemoptysis.   Initial OV 06/19/22 She was admitted 6/25 to 6/27 for concern of aspiration pneumonia vs cavitary mass. She presented with fever, cough, sore throat and swollen glands of her neck. CT Chest showed a thick walled cavitary lung mass measuring 3.9 x 2.8 x 2.8cm and small satellite nodule measuring 79m within the subpleural aspect of the posterior right upper lobe. No enlarged mediastinal or hilar lymph nodes.    Speech evaluation was notable for mild aspiration risk. She was having difficulty swallowing due to her sore throat and swollen glands in her neck.   She was started on broad spectrum antibiotics with improvement in her symptoms. Extended respiratory viral panel was negative. MRSA screen was negative. Urine legionella and strep pneumoniae antigens were negative.  She was discharged with 1 month of augmentin therapy since she had swift response to treatment in hospital and bronchoscopy was deferred until further follow up.   Since discharge, she has been doing great since being home. She feels she is back to baseline. She has no cough and no sputum production. No hemoptysis. She denies weight loss, fevers, chills or night sweats.   Past Medical History:  Diagnosis Date   Acute bronchitis 07/22/2008   Arthritis    Cataract    Diabetes mellitus without complication ([facility] 82/50/0370  Heart murmur    History of CVA  (cerebrovascular accident)    HYPERLIPIDEMIA, MIXED 07/18/2007   Hypertension    Stroke ([facility] 06/26/[place]   weakness R side     Family History  Problem Relation Age of Onset   Heart disease Father        No details   Heart failure Mother    Diabetes Neg Hx    Colon cancer Neg Hx    Esophageal cancer Neg Hx    Rectal cancer Neg Hx    Stomach cancer Neg Hx      Social History   Socioeconomic History   Marital status: Widowed    Spouse name: Not on file   Number of children: 0   Years of education: 169  Highest education level: Not on file  Occupational History   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    Hx raising of her deceased sister's 3 children, and is now raising/supporting 10yo  'great-grandson'.    Attends church, church community as support system. Many of her family has passed away.   Enjoys watching TV; used to enjoy going fishing         Social Determinants of Health   Financial Resource Strain: Low Risk  (07/16/2022)   Overall Financial Resource Strain (CARDIA)    Difficulty of Paying Living Expenses: Not hard at all  Food Insecurity: No Food Insecurity (07/16/2022)   Hunger Vital Sign    Worried About Running Out of Food in the Last Year: Never true    Ran Out of Food in the Last Year: Never true  Transportation Needs: No Transportation Needs (07/16/2022)   PRAPARE - Armed forces logistics/support/administrative officer (Medical): No    Lack of Transportation (Non-Medical): No  Physical Activity: Inactive (07/16/2022)   Exercise Vital Sign    Days of Exercise per Week: 0 days    Minutes of Exercise per Session: 0 min  Stress: No Stress Concern Present (07/16/2022)   [place]    Feeling of Stress : Not at all  Social Connections: Moderately Integrated (07/16/2022)   Social Connection and Isolation Panel [NHANES]    Frequency of Communication with Friends and Family: More than three times a week    Frequency of Social Gatherings with Friends and Family: More than three times a week    Attends Religious Services: More than 4 times per year    Active Member of [employer] or Organizations: Yes    Attends Archivist Meetings: More than 4 times per year    Marital Status: Widowed  Intimate Partner Violence: Not At Risk (07/16/2022)   Humiliation, Afraid, Rape, and Kick questionnaire    Fear of Current or Ex-Partner: No    Emotionally Abused: No    Physically Abused: No    Sexually Abused: No     Allergies  Allergen Reactions   Actonel [Risedronate] Other (See Comments)    It made her "feel like I'm going backwards"    Hydromet [Hydrocodone Bit-Homatrop Mbr] Itching     Outpatient Medications Prior to Visit  Medication Sig Dispense Refill   albuterol (VENTOLIN HFA) 108 (90 Base) MCG/ACT inhaler Inhale 2 puffs into the lungs every 6 (six) hours as needed for wheezing or shortness of breath. 8 g 2   Alcohol Swabs (ALCOHOL PREP) PADS Use to test 1 X day and diagnosis code is E 11.9 100 each 1   amLODipine (NORVASC) 5 MG tablet Take 1 tablet by mouth once daily 90 tablet 0   aspirin 81 MG chewable tablet Chew 1 tablet (81 mg total) by mouth daily. 30 tablet 3   Blood Glucose Monitoring Suppl (ONETOUCH VERIO) w/Device KIT Use to check blood sugar two times daily 1 kit 0   diclofenac (VOLTAREN) 75 MG EC tablet  Take 1 tablet (75 mg total) by mouth 2 (two) times daily. 180 tablet 3   empagliflozin (JARDIANCE) 25 MG TABS tablet Take 1 tablet (25 mg total) by mouth daily before breakfast. 90 tablet 2   furosemide (LASIX) 20 MG tablet Take 1 tablet by mouth once daily 90 tablet 0   glipiZIDE (GLUCOTROL) 10 MG tablet Take 1 tablet (10 mg total) by mouth 2 (two) times daily before a meal. 180 tablet 1  Lancets (ONETOUCH ULTRASOFT) lancets USE   TO CHECK GLUCOSE ONCE DAILY 100 each 0   lisinopril (ZESTRIL) 10 MG tablet Take 2 tablets by mouth once daily 180 tablet 0   metFORMIN (GLUCOPHAGE) 500 MG tablet Take 1 tablet (500 mg total) by mouth 2 (two) times daily with a meal. 180 tablet 3   metoprolol succinate (TOPROL-XL) 50 MG 24 hr tablet TAKE ONE TABLET BY MOUTH WITH OR IMMEDIATELY FOLLOWING A MEAL 90 tablet 0   ONETOUCH VERIO test strip USE 1 STRIP TO CHECK GLUCOSE ONCE DAILY 50 each 0   rosuvastatin (CRESTOR) 40 MG tablet Take 1 tablet (40 mg total) by mouth daily. 90 tablet 3   spironolactone (ALDACTONE) 25 MG tablet Take 1 tablet by mouth once daily 90 tablet 0   No facility-administered medications prior to visit.   Review of Systems  Constitutional:  Negative for chills, fever, malaise/fatigue and weight loss.  HENT:  Negative for congestion, sinus pain and sore throat.   Eyes: Negative.   Respiratory:  Negative for cough, hemoptysis, sputum production, shortness of breath and wheezing.   Cardiovascular:  Negative for chest pain, palpitations, orthopnea, claudication and leg swelling.  Gastrointestinal:  Negative for abdominal pain, heartburn, nausea and vomiting.  Genitourinary: Negative.   Musculoskeletal:  Negative for joint pain and myalgias.  Skin:  Negative for rash.  Neurological:  Negative for weakness.  Endo/Heme/Allergies: Negative.   Psychiatric/Behavioral: Negative.     Objective:   Vitals:   08/01/22 1153  BP: (!) 140/72  Pulse: 70  Temp: 98.5 F (36.9 C)  TempSrc: Oral   SpO2: 94%  Weight: 218 lb 3.2 oz (99 kg)  Height: _0  (1.676 m)     Physical Exam Constitutional:      General: She is not in acute distress.    Appearance: She is obese. She is not ill-appearing.  HENT:     Head: Normocephalic and atraumatic.  Eyes:     General: No scleral icterus. Cardiovascular:     Rate and Rhythm: Normal rate and regular rhythm.     Pulses: Normal pulses.     Heart sounds: Normal heart sounds. No murmur heard. Pulmonary:     Effort: Pulmonary effort is normal.     Breath sounds: Normal breath sounds. No wheezing, rhonchi or rales.  Musculoskeletal:     Right lower leg: No edema.     Left lower leg: No edema.  Skin:    General: Skin is warm and dry.  Neurological:     General: No focal deficit present.     Mental Status: She is alert.  Psychiatric:        Mood and Affect: Mood normal.        Behavior: Behavior normal.        Thought Content: Thought content normal.        Judgment: Judgment normal.    CBC    Component Value Date/Time   WBC 8.0 06/01/2022 1339   RBC 3.70 (L) 06/01/2022 1339   HGB 11.2 (L) 06/01/2022 1339   HCT 34.5 (L) 06/01/2022 1339   PLT 308.0 06/01/2022 1339   MCV 93.4 06/01/2022 1339   MCH 29.6 05/22/2022 0053   MCHC 32.4 06/01/2022 1339   RDW 15.6 (H) 06/01/2022 1339   LYMPHSABS 2.9 06/01/2022 1339   MONOABS 0.9 06/01/2022 1339   EOSABS 0.3 06/01/2022 1339   BASOSABS 0.0 06/01/2022 1339      Latest Ref Rng & Units 06/01/2022  1:39 PM 05/22/2022   12:53 AM 05/21/2022    7:25 AM  BMP  Glucose 70 - 99 mg/dL 79  121  122   BUN 6 - 23 mg/dL 45  20  26   Creatinine 0.40 - 1.20 mg/dL 1.67  1.42  1.47   Sodium 135 - 145 mEq/L 137  138  138   Potassium 3.5 - 5.1 mEq/L 4.6  4.4  4.7   Chloride 96 - 112 mEq/L 103  109  107   CO2 19 - 32 mEq/L _0 Calcium 8.4 - 10.5 mg/dL 10.2  9.7  9.6    9.3    Chest imaging: CT Chest 07/26/22 1. Irregular thick wall cavitary lesion in the right upper lobe, slightly  increased in size from the prior exam. The possibility of underlying neoplasm can not be excluded. Pulmonary consultation is recommended. Consider one of the following in 3 months for both low-risk and high-risk individuals: (a) repeat chest CT, (b) follow-up PET-CT, or (c) tissue sampling. This recommendation follows the consensus statement: Guidelines for Management of Incidental Pulmonary Nodules Detected on CT Images: From the Fleischner Society 2017; Radiology 2017; 284:228-243. 2. Stable 7 mm right upper lobe pulmonary nodule. 3. Cardiomegaly with coronary artery calcifications. 4. Aortic atherosclerosis.  CT Chest 05/20/22 1. Thick walled cavitary lung mass within the posterior right upper lobe is identified and is worrisome for primary bronchogenic carcinoma. Squamous cell carcinoma may show cavitation within the lungs. Differential consideration include infectious/inflammatory process. Given the absence of additional cavitary lung lesions septic emboli is considered less favored. 2. No signs of nodal metastasis or distant metastatic disease. 3. Sigmoid diverticulosis without signs of acute diverticulitis. 4. Aortic Atherosclerosis (ICD10-I70.0).  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 She continues to feel well since her hospitalization.  CT Chest scan 07/26/22 reviewed with patient. We discussed the concerning findings that the RUL cavitary lesion is growing and  we are concerned about cancer. She is agreeable to navigation bronchoscopy for biopsy. All risks and benefits were discussed with her. Case was discussed with Dr. Verlee Monte and she is schedule for nav bronch on 9/21.   We will check a NM PET scan in the meant time. Based on results, she will likely need MRI brain as well.  Follow up in 3 months.  Freda Jackson, MD [facility] Office: [phone]   Current Outpatient Medications:    albuterol (VENTOLIN HFA) 108 (90 Base) MCG/ACT inhaler, Inhale 2 puffs into the lungs every 6 (six) hours as needed for wheezing or shortness of breath., Disp: 8 g, Rfl: 2   Alcohol Swabs (ALCOHOL PREP) PADS, Use to test 1 X day and diagnosis code is E 11.9, Disp: 100 each, Rfl: 1   amLODipine (NORVASC) 5 MG tablet, Take 1 tablet by mouth once daily, Disp: 90 tablet, Rfl: 0   aspirin 81  MG chewable tablet, Chew 1 tablet (81 mg total) by mouth daily., Disp: 30 tablet, Rfl: 3   Blood Glucose Monitoring Suppl (ONETOUCH VERIO) w/Device KIT, Use to check blood sugar two times daily, Disp: 1 kit, Rfl: 0   diclofenac (VOLTAREN) 75 MG EC tablet, Take 1 tablet (75 mg total) by mouth 2 (two) times daily., Disp: 180 tablet, Rfl: 3   empagliflozin (JARDIANCE) 25 MG TABS tablet, Take 1 tablet (25 mg total) by mouth daily before breakfast., Disp: 90 tablet, Rfl: 2   furosemide (LASIX) 20 MG tablet, Take 1 tablet by mouth once daily, Disp: 90 tablet, Rfl: 0   glipiZIDE (GLUCOTROL) 10 MG tablet, Take 1 tablet (10 mg total) by mouth 2 (two) times daily before a meal., Disp: 180 tablet, Rfl: 1   Lancets (ONETOUCH ULTRASOFT) lancets, USE   TO CHECK GLUCOSE ONCE DAILY, Disp: 100 each, Rfl: 0   lisinopril (ZESTRIL) 10 MG tablet, Take 2 tablets by mouth once daily, Disp: 180 tablet, Rfl: 0   metFORMIN (GLUCOPHAGE) 500 MG tablet, Take 1 tablet (500 mg total) by mouth 2 (two) times daily with a meal., Disp: 180 tablet, Rfl: 3   metoprolol succinate (TOPROL-XL) 50 MG  24 hr tablet, TAKE ONE TABLET BY MOUTH WITH OR IMMEDIATELY FOLLOWING A MEAL, Disp: 90 tablet, Rfl: 0   ONETOUCH VERIO test strip, USE 1 STRIP TO CHECK GLUCOSE ONCE DAILY, Disp: 50 each, Rfl: 0   rosuvastatin (CRESTOR) 40 MG tablet, Take 1 tablet (40 mg total) by mouth daily., Disp: 90 tablet, Rfl: 3   spironolactone (ALDACTONE) 25 MG tablet, Take 1 tablet by mouth once daily, Disp: 90 tablet, Rfl: 0

## 2022-08-01 NOTE — H&P (View-Only) (Signed)
Synopsis: Referred in July 2023 for hospital follow up for pneumonia  Subjective:   PATIENT ID: [name] GENDER: female DOB: 1946-09-19, MRN: [id number]  HPI  Chief Complaint  Patient presents with   Follow-up    Pt is here today to go over CT scan results    [name] is a 76 year old woman, former smoker with hypertension, CVA with residual right sided weakness and DMII who returns to pulmonary clinic for follow up of RUL cavitary lesion.   Repeat CT Chest scan 07/26/22 shows increase in RUL cavitary lesion size. She denies cough or hemoptysis.   Initial OV 06/19/22 She was admitted 6/25 to 6/27 for concern of aspiration pneumonia vs cavitary mass. She presented with fever, cough, sore throat and swollen glands of her neck. CT Chest showed a thick walled cavitary lung mass measuring 3.9 x 2.8 x 2.8cm and small satellite nodule measuring 79m within the subpleural aspect of the posterior right upper lobe. No enlarged mediastinal or hilar lymph nodes.    Speech evaluation was notable for mild aspiration risk. She was having difficulty swallowing due to her sore throat and swollen glands in her neck.   She was started on broad spectrum antibiotics with improvement in her symptoms. Extended respiratory viral panel was negative. MRSA screen was negative. Urine legionella and strep pneumoniae antigens were negative.  She was discharged with 1 month of augmentin therapy since she had swift response to treatment in hospital and bronchoscopy was deferred until further follow up.   Since discharge, she has been doing great since being home. She feels she is back to baseline. She has no cough and no sputum production. No hemoptysis. She denies weight loss, fevers, chills or night sweats.   Past Medical History:  Diagnosis Date   Acute bronchitis 07/22/2008   Arthritis    Cataract    Diabetes mellitus without complication ([place] 82/[place]/0370  Heart murmur    History of CVA  (cerebrovascular accident)    HYPERLIPIDEMIA, MIXED 07/18/2007   Hypertension    Stroke ([place] 06/26/[place]   weakness R side     Family History  Problem Relation Age of Onset   Heart disease Father        No details   Heart failure Mother    Diabetes Neg Hx    Colon cancer Neg Hx    Esophageal cancer Neg Hx    Rectal cancer Neg Hx    Stomach cancer Neg Hx      Social History   Socioeconomic History   Marital status: Widowed    Spouse name: Not on file   Number of children: 0   Years of education: 169  Highest education level: Not on file  Occupational History   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    Hx raising of her deceased sister's 3 children, and is now raising/supporting 10yo  'great-grandson'.    Attends church, church community as support system. Many of her family has passed away.   Enjoys watching TV; used to enjoy going fishing         Social Determinants of Health   Financial Resource Strain: Low Risk  (07/16/2022)   Overall Financial Resource Strain (CARDIA)    Difficulty of Paying Living Expenses: Not hard at all  Food Insecurity: No Food Insecurity (07/16/2022)   Hunger Vital Sign    Worried About Running Out of Food in the Last Year: Never true    Ran Out of Food in the Last Year: Never true  Transportation Needs: No Transportation Needs (07/16/2022)   PRAPARE - Armed forces logistics/support/administrative officer (Medical): No    Lack of Transportation (Non-Medical): No  Physical Activity: Inactive (07/16/2022)   Exercise Vital Sign    Days of Exercise per Week: 0 days    Minutes of Exercise per Session: 0 min  Stress: No Stress Concern Present (07/16/2022)   [place]    Feeling of Stress : Not at all  Social Connections: Moderately Integrated (07/16/2022)   Social Connection and Isolation Panel [NHANES]    Frequency of Communication with Friends and Family: More than three times a week    Frequency of Social Gatherings with Friends and Family: More than three times a week    Attends Religious Services: More than 4 times per year    Active Member of [employer] or Organizations: Yes    Attends Archivist Meetings: More than 4 times per year    Marital Status: Widowed  Intimate Partner Violence: Not At Risk (07/16/2022)   Humiliation, Afraid, Rape, and Kick questionnaire    Fear of Current or Ex-Partner: No    Emotionally Abused: No    Physically Abused: No    Sexually Abused: No     Allergies  Allergen Reactions   Actonel [Risedronate] Other (See Comments)    It made her "feel like I'm going backwards"    Hydromet [Hydrocodone Bit-Homatrop Mbr] Itching     Outpatient Medications Prior to Visit  Medication Sig Dispense Refill   albuterol (VENTOLIN HFA) 108 (90 Base) MCG/ACT inhaler Inhale 2 puffs into the lungs every 6 (six) hours as needed for wheezing or shortness of breath. 8 g 2   Alcohol Swabs (ALCOHOL PREP) PADS Use to test 1 X day and diagnosis code is E 11.9 100 each 1   amLODipine (NORVASC) 5 MG tablet Take 1 tablet by mouth once daily 90 tablet 0   aspirin 81 MG chewable tablet Chew 1 tablet (81 mg total) by mouth daily. 30 tablet 3   Blood Glucose Monitoring Suppl (ONETOUCH VERIO) w/Device KIT Use to check blood sugar two times daily 1 kit 0   diclofenac (VOLTAREN) 75 MG EC tablet  Take 1 tablet (75 mg total) by mouth 2 (two) times daily. 180 tablet 3   empagliflozin (JARDIANCE) 25 MG TABS tablet Take 1 tablet (25 mg total) by mouth daily before breakfast. 90 tablet 2   furosemide (LASIX) 20 MG tablet Take 1 tablet by mouth once daily 90 tablet 0   glipiZIDE (GLUCOTROL) 10 MG tablet Take 1 tablet (10 mg total) by mouth 2 (two) times daily before a meal. 180 tablet 1  Lancets (ONETOUCH ULTRASOFT) lancets USE   TO CHECK GLUCOSE ONCE DAILY 100 each 0   lisinopril (ZESTRIL) 10 MG tablet Take 2 tablets by mouth once daily 180 tablet 0   metFORMIN (GLUCOPHAGE) 500 MG tablet Take 1 tablet (500 mg total) by mouth 2 (two) times daily with a meal. 180 tablet 3   metoprolol succinate (TOPROL-XL) 50 MG 24 hr tablet TAKE ONE TABLET BY MOUTH WITH OR IMMEDIATELY FOLLOWING A MEAL 90 tablet 0   ONETOUCH VERIO test strip USE 1 STRIP TO CHECK GLUCOSE ONCE DAILY 50 each 0   rosuvastatin (CRESTOR) 40 MG tablet Take 1 tablet (40 mg total) by mouth daily. 90 tablet 3   spironolactone (ALDACTONE) 25 MG tablet Take 1 tablet by mouth once daily 90 tablet 0   No facility-administered medications prior to visit.   Review of Systems  Constitutional:  Negative for chills, fever, malaise/fatigue and weight loss.  HENT:  Negative for congestion, sinus pain and sore throat.   Eyes: Negative.   Respiratory:  Negative for cough, hemoptysis, sputum production, shortness of breath and wheezing.   Cardiovascular:  Negative for chest pain, palpitations, orthopnea, claudication and leg swelling.  Gastrointestinal:  Negative for abdominal pain, heartburn, nausea and vomiting.  Genitourinary: Negative.   Musculoskeletal:  Negative for joint pain and myalgias.  Skin:  Negative for rash.  Neurological:  Negative for weakness.  Endo/Heme/Allergies: Negative.   Psychiatric/Behavioral: Negative.     Objective:   Vitals:   08/01/22 1153  BP: (!) 140/72  Pulse: 70  Temp: 98.5 F (36.9 C)  TempSrc: Oral   SpO2: 94%  Weight: 218 lb 3.2 oz (99 kg)  Height: _0  (1.676 m)     Physical Exam Constitutional:      General: She is not in acute distress.    Appearance: She is obese. She is not ill-appearing.  HENT:     Head: Normocephalic and atraumatic.  Eyes:     General: No scleral icterus. Cardiovascular:     Rate and Rhythm: Normal rate and regular rhythm.     Pulses: Normal pulses.     Heart sounds: Normal heart sounds. No murmur heard. Pulmonary:     Effort: Pulmonary effort is normal.     Breath sounds: Normal breath sounds. No wheezing, rhonchi or rales.  Musculoskeletal:     Right lower leg: No edema.     Left lower leg: No edema.  Skin:    General: Skin is warm and dry.  Neurological:     General: No focal deficit present.     Mental Status: She is alert.  Psychiatric:        Mood and Affect: Mood normal.        Behavior: Behavior normal.        Thought Content: Thought content normal.        Judgment: Judgment normal.    CBC    Component Value Date/Time   WBC 8.0 06/01/2022 1339   RBC 3.70 (L) 06/01/2022 1339   HGB 11.2 (L) 06/01/2022 1339   HCT 34.5 (L) 06/01/2022 1339   PLT 308.0 06/01/2022 1339   MCV 93.4 06/01/2022 1339   MCH 29.6 05/22/2022 0053   MCHC 32.4 06/01/2022 1339   RDW 15.6 (H) 06/01/2022 1339   LYMPHSABS 2.9 06/01/2022 1339   MONOABS 0.9 06/01/2022 1339   EOSABS 0.3 06/01/2022 1339   BASOSABS 0.0 06/01/2022 1339      Latest Ref Rng & Units 06/01/2022  1:39 PM 05/22/2022   12:53 AM 05/21/2022    7:25 AM  BMP  Glucose 70 - 99 mg/dL 79  121  122   BUN 6 - 23 mg/dL 45  20  26   Creatinine 0.40 - 1.20 mg/dL 1.67  1.42  1.47   Sodium 135 - 145 mEq/L 137  138  138   Potassium 3.5 - 5.1 mEq/L 4.6  4.4  4.7   Chloride 96 - 112 mEq/L 103  109  107   CO2 19 - 32 mEq/L _0 Calcium 8.4 - 10.5 mg/dL 10.2  9.7  9.6    9.3    Chest imaging: CT Chest 07/26/22 1. Irregular thick wall cavitary lesion in the right upper lobe, slightly  increased in size from the prior exam. The possibility of underlying neoplasm can not be excluded. Pulmonary consultation is recommended. Consider one of the following in 3 months for both low-risk and high-risk individuals: (a) repeat chest CT, (b) follow-up PET-CT, or (c) tissue sampling. This recommendation follows the consensus statement: Guidelines for Management of Incidental Pulmonary Nodules Detected on CT Images: From the Fleischner Society 2017; Radiology 2017; 284:228-243. 2. Stable 7 mm right upper lobe pulmonary nodule. 3. Cardiomegaly with coronary artery calcifications. 4. Aortic atherosclerosis.  CT Chest 05/20/22 1. Thick walled cavitary lung mass within the posterior right upper lobe is identified and is worrisome for primary bronchogenic carcinoma. Squamous cell carcinoma may show cavitation within the lungs. Differential consideration include infectious/inflammatory process. Given the absence of additional cavitary lung lesions septic emboli is considered less favored. 2. No signs of nodal metastasis or distant metastatic disease. 3. Sigmoid diverticulosis without signs of acute diverticulitis. 4. Aortic Atherosclerosis (ICD10-I70.0).  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 She continues to feel well since her hospitalization.  CT Chest scan 07/26/22 reviewed with patient. We discussed the concerning findings that the RUL cavitary lesion is growing and  we are concerned about cancer. She is agreeable to navigation bronchoscopy for biopsy. All risks and benefits were discussed with her. Case was discussed with Dr. Verlee Monte and she is schedule for nav bronch on 9/21.   We will check a NM PET scan in the meant time. Based on results, she will likely need MRI brain as well.  Follow up in 3 months.  Pamela Jackson, MD [facility] Office: [phone]   Current Outpatient Medications:    albuterol (VENTOLIN HFA) 108 (90 Base) MCG/ACT inhaler, Inhale 2 puffs into the lungs every 6 (six) hours as needed for wheezing or shortness of breath., Disp: 8 g, Rfl: 2   Alcohol Swabs (ALCOHOL PREP) PADS, Use to test 1 X day and diagnosis code is E 11.9, Disp: 100 each, Rfl: 1   amLODipine (NORVASC) 5 MG tablet, Take 1 tablet by mouth once daily, Disp: 90 tablet, Rfl: 0   aspirin 81  MG chewable tablet, Chew 1 tablet (81 mg total) by mouth daily., Disp: 30 tablet, Rfl: 3   Blood Glucose Monitoring Suppl (ONETOUCH VERIO) w/Device KIT, Use to check blood sugar two times daily, Disp: 1 kit, Rfl: 0   diclofenac (VOLTAREN) 75 MG EC tablet, Take 1 tablet (75 mg total) by mouth 2 (two) times daily., Disp: 180 tablet, Rfl: 3   empagliflozin (JARDIANCE) 25 MG TABS tablet, Take 1 tablet (25 mg total) by mouth daily before breakfast., Disp: 90 tablet, Rfl: 2   furosemide (LASIX) 20 MG tablet, Take 1 tablet by mouth once daily, Disp: 90 tablet, Rfl: 0   glipiZIDE (GLUCOTROL) 10 MG tablet, Take 1 tablet (10 mg total) by mouth 2 (two) times daily before a meal., Disp: 180 tablet, Rfl: 1   Lancets (ONETOUCH ULTRASOFT) lancets, USE   TO CHECK GLUCOSE ONCE DAILY, Disp: 100 each, Rfl: 0   lisinopril (ZESTRIL) 10 MG tablet, Take 2 tablets by mouth once daily, Disp: 180 tablet, Rfl: 0   metFORMIN (GLUCOPHAGE) 500 MG tablet, Take 1 tablet (500 mg total) by mouth 2 (two) times daily with a meal., Disp: 180 tablet, Rfl: 3   metoprolol succinate (TOPROL-XL) 50 MG  24 hr tablet, TAKE ONE TABLET BY MOUTH WITH OR IMMEDIATELY FOLLOWING A MEAL, Disp: 90 tablet, Rfl: 0   ONETOUCH VERIO test strip, USE 1 STRIP TO CHECK GLUCOSE ONCE DAILY, Disp: 50 each, Rfl: 0   rosuvastatin (CRESTOR) 40 MG tablet, Take 1 tablet (40 mg total) by mouth daily., Disp: 90 tablet, Rfl: 3   spironolactone (ALDACTONE) 25 MG tablet, Take 1 tablet by mouth once daily, Disp: 90 tablet, Rfl: 0

## 2022-08-01 NOTE — Telephone Encounter (Signed)
Order placed for navigation bronchoscopy

## 2022-08-02 ENCOUNTER — Encounter: Payer: Self-pay | Admitting: Student

## 2022-08-02 ENCOUNTER — Other Ambulatory Visit: Payer: Self-pay | Admitting: Student

## 2022-08-02 DIAGNOSIS — Z01812 Encounter for preprocedural laboratory examination: Secondary | ICD-10-CM

## 2022-08-02 NOTE — Telephone Encounter (Signed)
Pt has been scheduled and notified.  Nothing further needed.

## 2022-08-04 ENCOUNTER — Encounter: Payer: Self-pay | Admitting: Pulmonary Disease

## 2022-08-13 ENCOUNTER — Other Ambulatory Visit: Payer: Medicare Other

## 2022-08-13 DIAGNOSIS — Z01812 Encounter for preprocedural laboratory examination: Secondary | ICD-10-CM | POA: Diagnosis not present

## 2022-08-15 ENCOUNTER — Other Ambulatory Visit: Payer: Self-pay

## 2022-08-15 ENCOUNTER — Telehealth: Payer: Self-pay | Admitting: Pulmonary Disease

## 2022-08-15 ENCOUNTER — Encounter (HOSPITAL_COMMUNITY): Payer: Self-pay | Admitting: Student

## 2022-08-15 ENCOUNTER — Encounter (HOSPITAL_COMMUNITY)
Admission: RE | Admit: 2022-08-15 | Discharge: 2022-08-15 | Disposition: A | Discharge status: Home / Self Care | Payer: Medicare Other | Source: Ambulatory Visit | Attending: Pulmonary Disease | Admitting: Pulmonary Disease

## 2022-08-15 DIAGNOSIS — C3490 Malignant neoplasm of unspecified part of unspecified bronchus or lung: Secondary | ICD-10-CM | POA: Insufficient documentation

## 2022-08-15 LAB — SPECIMEN STATUS REPORT

## 2022-08-15 LAB — GLUCOSE, CAPILLARY: Glucose-Capillary: 103 mg/dL — ABNORMAL HIGH (ref 70–99)

## 2022-08-15 LAB — NOVEL CORONAVIRUS, NAA: SARS-CoV-2, NAA: NOT DETECTED

## 2022-08-15 MED ORDER — FLUDEOXYGLUCOSE F - 18 (FDG) INJECTION
10.8400 | Freq: Once | INTRAVENOUS | Status: AC | PRN
  Administered 2022-08-15: 10.84 via INTRAVENOUS

## 2022-08-15 NOTE — Anesthesia Preprocedure Evaluation (Addendum)
Anesthesia Evaluation  Patient identified by MRN, date of birth, ID band Patient awake    Reviewed: Allergy & Precautions, NPO status , Patient's Chart, lab work & pertinent test results  History of Anesthesia Complications Negative for: history of anesthetic complications  Airway Mallampati: II  TM Distance: >3 FB Neck ROM: Full    Dental  (+) Dental Advisory Given   Pulmonary shortness of breath, pneumonia, former smoker,  1. The left ventricle has hyperdynamic systolic function, with an  ejection fraction of >65%. The cavity size was normal. There is severely  increased left ventricular wall thickness. Left ventricular diastolic  Doppler parameters are consistent with  impaired relaxation.  2. The right ventricle has normal systolic function. The cavity was  normal. There is no increase in right ventricular wall thickness.  3. The aortic valve is tricuspid. Severely thickening of the aortic  valve. Severe calcifcation of the aortic valve. Mild stenosis of the  aortic valve. Severe aortic annular calcification noted.  4. The mitral valve is abnormal. Moderate thickening of the mitral valve  leaflet. Moderate calcification of the mitral valve leaflet. There is  moderate mitral annular calcification present. No evidence of mitral valve  stenosis.  5. The aorta is normal in size and structure.  6. The aortic root is normal in size and structure.  7. Pulmonary hypertension is indeterminant, inadequate TR jet.    breath sounds clear to auscultation       Cardiovascular hypertension, Pt. on medications (-) angina(-) Past MI + Valvular Problems/Murmurs AS  Rhythm:Regular  1. The left ventricle has hyperdynamic systolic function, with an  ejection fraction of >65%. The cavity size was normal. There is severely  increased left ventricular wall thickness. Left ventricular diastolic  Doppler parameters are consistent with   impaired relaxation.  2. The right ventricle has normal systolic function. The cavity was  normal. There is no increase in right ventricular wall thickness.  3. The aortic valve is tricuspid. Severely thickening of the aortic  valve. Severe calcifcation of the aortic valve. Mild stenosis of the  aortic valve. Severe aortic annular calcification noted.  4. The mitral valve is abnormal. Moderate thickening of the mitral valve  leaflet. Moderate calcification of the mitral valve leaflet. There is  moderate mitral annular calcification present. No evidence of mitral valve  stenosis.  5. The aorta is normal in size and structure.  6. The aortic root is normal in size and structure.  7. Pulmonary hypertension is indeterminant, inadequate TR jet.    Neuro/Psych CVA, Residual Symptoms negative psych ROS   GI/Hepatic negative GI ROS, Neg liver ROS,   Endo/Other  diabetes, Type 2  Renal/GU Renal InsufficiencyRenal diseaseLab Results      Component                Value               Date                      CREATININE               1.82 (H)            08/16/2022                Musculoskeletal  (+) Arthritis ,   Abdominal   Peds  Hematology negative hematology ROS (+) Lab Results      Component  Value               Date                      WBC                      8.7                 08/16/2022                HGB                      12.2                08/16/2022                HCT                      39.1                08/16/2022                MCV                      98.0                08/16/2022                PLT                      245                 08/16/2022              Anesthesia Other Findings   Reproductive/Obstetrics                           Anesthesia Physical Anesthesia Plan  ASA: 3  Anesthesia Plan: General   Post-op Pain Management: Minimal or no pain anticipated   Induction: Intravenous  PONV Risk  Score and Plan: 3 and Ondansetron, Dexamethasone, Propofol infusion and TIVA  Airway Management Planned: Oral ETT  Additional Equipment: None  Intra-op Plan:   Post-operative Plan: Extubation in OR  Informed Consent: I have reviewed the patients History and Physical, chart, labs and discussed the procedure including the risks, benefits and alternatives for the proposed anesthesia with the patient or authorized representative who has indicated his/her understanding and acceptance.     Dental advisory given  Plan Discussed with: CRNA  Anesthesia Plan Comments: (PAT note written 08/15/2022 by Myra Gianotti, PA-C. )       Anesthesia Quick Evaluation

## 2022-08-15 NOTE — Progress Notes (Signed)
PCP - Dr. Sharlene Motts  Cardiologist - Denies  EP- Denies  Endocrine- Denies  Pulm- Denies  Chest x-ray - 08/16/22- Day of surgery  EKG - 05/20/22 (E)  Stress Test - Denies  ECHO - 06/27/2019 (E)  Cardiac Cath - Denies  AICD-na PM-na LOOP-na  Nerve Stimulator- Denies  Dialysis- Denies  Sleep Study - Denies CPAP - Denies  LABS- 08/16/22: CBC, BMP  ASA- LD- 9/20  ERAS- No  HA1C- 08/13/22(E): 8.3 Fasting Blood Sugar - 130-200 Checks Blood Sugar ___1__ times a week  Anesthesia- Yes- abnormal A1C- notified  Pt denies having chest pain, sob, or fever during the pre-op phone call. All instructions explained to the pt, with a verbal understanding of the material including: as of today, stop taking all Aspirin (unless instructed by your doctor) and Other Aspirin containing products, Vitamins, Fish oils, and Herbal medications. Also stop all NSAIDS i.e. Advil, Ibuprofen, Motrin, Aleve, Anaprox, Naproxen, BC, Goody Powders, and all Supplements.  WHAT DO I DO ABOUT MY DIABETES MEDICATION?  Do not take Jardiance, Glipzide, Metformin the morning of surgery.  If your CBG is greater than 220 mg/dL, inform the staff upon arrival to Pre-Op.  How to Manage Your Diabetes Before and After Surgery  How do I manage my blood sugar before surgery? Check your blood sugar at least 4 times a day, starting 2 days before surgery, to make sure that the level is not too high or low. Check your blood sugar the morning of your surgery when you wake up and every 2 hours until you get to the Short Stay unit. If your blood sugar is less than 70 mg/dL, you will need to treat for low blood sugar: Do not take insulin. Treat a low blood sugar (less than 70 mg/dL) with  cup of clear juice (cranberry or apple), 4 glucose tablets, OR glucose gel. Recheck blood sugar in 15 minutes after treatment (to make sure it is greater than 70 mg/dL). If your blood sugar is not greater than 70 mg/dL on recheck, call  [phone]  for further instructions. Report your blood sugar to the short stay nurse when you get to Short Stay.  Reviewed and Endorsed by [facility] Patient Education Committee, August 2015   Pt also instructed to wear a mask and social distance if she goes out. The opportunity to ask questions was provided.

## 2022-08-15 NOTE — Telephone Encounter (Signed)
Called to speak with a nurse regarding a clearance for patient's scheduled surgery on tomorrow.  Please call Ebony Hail at [facility] to speak with her further.  CB# [phone]

## 2022-08-15 NOTE — Telephone Encounter (Signed)
Called and spoke with [name] at [facility] who stated that while I was calling her, she received a  message from Dr. Verlee Monte and said that all has been taken care of. Nothing further needed.

## 2022-08-15 NOTE — Progress Notes (Signed)
Anesthesia Chart Review: [name]  Case: [id number] Date/Time: 08/16/22 0730   Procedure: ROBOTIC ASSISTED NAVIGATIONAL BRONCHOSCOPY   Anesthesia type: General   Pre-op diagnosis: lung nodule   Location: [facility] 3 / [facility]   Surgeons: Maryjane Hurter, MD       DISCUSSION: Patient is a 76 year old female scheduled for the above procedure. She was hospitalized at [facility] 05/20/22-05/22/22 for generalized weakness, cough, fever, sore throat with imaging revealing a posterior RUL cavity mass worrisome for bronchogenic carcinoma versus infectious process. WBC peaked at 23.9K. She had mild dysphagia on swallow evaluation, so aspiration PNA considered and treated with 4 weeks Augmentin. Required short term O2 [place] and IVF for AKI (Cr 1.23->1.91). ID and pulmonary consulted. 07/26/22 follow-up chest CT showed some increase in size. PET scan done on 08/15/22, but report is pending.  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   Patient with history of murmur with mild AS, severe LVH, LVEF > 65% by 06/2019 echo. Discussed with anesthesiologist Belinda Block, MD. If surgery could be safely postponed then consider cardiology evaluation. I reached out to Dr. Verlee Monte regarding urgency of procedure. If lesion is in fact cancerous then delay of surgery could decrease chances of curable disease. He offered to speak with an anesthesiologist, preferable one assigned to the case. Dr. Oleta Mouse updated, and if possible will be assigned. Anesthesia team to evaluate on the day of surgery, and definitive anesthesia plan made at that time. Per PAT RN phone interview, denied chest pain, syncope, SOB.    VS:  BP Readings from Last 3 Encounters:  08/01/22 (!) 140/72  06/19/22 140/60  06/07/22 (!) 166/87    Pulse Readings from Last 3 Encounters:  08/01/22 70  06/19/22 (!) 55  06/07/22 66     PROVIDERS: Laurey Morale, MD is PCP  - Freda Jackson, MD is pulmonlogist - She is not currently followed routinely by cardiology but had an evaluation by Minus Breeding, MD in [phone] for syncope with negative cardiac work-up.  Prudencio Pair, MD is ID - Antony Contras, MD is neurologist. Last visit 04/14/20 with Frann Rider, NP.   LABS: She is for labs on arrival as indicated. Current labs are > 38 days old and results include: Lab Results  Component Value Date   WBC 8.0 06/01/2022   HGB 11.2 (L) 06/01/2022   HCT 34.5 (L) 06/01/2022   PLT 308.0 06/01/2022   GLUCOSE 79 06/01/2022   ALT 55 (H) 05/21/2022   AST 35 05/21/2022   NA 137 06/01/2022   K 4.6 06/01/2022   CL 103 06/01/2022   CREATININE 1.67 (H) 06/01/2022   BUN 45 (H) 06/01/2022   CO2 24 06/01/2022   TSH 2.03 02/06/2022   INR 1.2 05/20/2022   HGBA1C 8.3 (H) 05/14/2022    IMAGES: PET Scan 08/15/22: IMPRESSION: 1. 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 2. Upper normal sized right paratracheal node with low-level, indeterminate hypermetabolism. 3. Otherwise, no evidence of tracer avid metastasis. 4. Left adrenal adenoma with low-level hypermetabolism. In the absence of clinically indicated signs/symptoms require(s) no independent follow-up. 5. Incidental findings, including: Coronary artery atherosclerosis. Aortic Atherosclerosis (ICD10-I70.0). Tiny hiatal hernia. Sinus disease.   CT Chest 07/26/22: IMPRESSION: 1. Irregular thick wall cavitary lesion in the right upper lobe, slightly increased in size  from the prior exam. The possibility of underlying neoplasm can not be excluded. Pulmonary consultation is recommended. Consider one of the following in 3 months for both low-risk and high-risk individuals: (a) repeat chest CT, (b) follow-up PET-CT, or (c) tissue sampling.  This recommendation follows the consensus statement: Guidelines for Management of Incidental Pulmonary Nodules Detected on CT Images: From the Fleischner Society 2017; Radiology 2017; 284:228-243. 2. Stable 7 mm right upper lobe pulmonary nodule. 3. Cardiomegaly with coronary artery calcifications. 4. Aortic atherosclerosis.   EKG: 05/20/22: NSR   CV: Echo 06/27/19: IMPRESSIONS   1. The left ventricle has hyperdynamic systolic function, with an ejection fraction of >65%. The cavity size was normal. There is severely increased left ventricular wall thickness. Left ventricular diastolic Doppler parameters are consistent with impaired relaxation. Severe LVH and hyperdynamic LV creates a mild dynamic subvalvular gradient, peak 16 mmHg.   2. The right ventricle has normal systolic function. The cavity was normal. There is no increase in right ventricular wall thickness.   3. The aortic valve is tricuspid. Severely thickening of the aortic valve. Severe calcifcation of the aortic valve. Mild stenosis of the aortic valve with a calculated valve area (VTI) of 1.80 cm. Severe aortic annular calcification noted. AV Vmax: 250.45 cm/s. AV Vmean: 162.167 cm/s. AV Peak Grad: 25.1 mmHg. AV Mean Grad:12.4 mmHg.    4. The mitral valve is abnormal. Moderate thickening of the mitral valve leaflet. Moderate calcification of the mitral valve leaflet. There is moderate mitral annular calcification present. No evidence of mitral valve stenosis.   5. The aorta is normal in size and structure.   6. The aortic root is normal in size and structure.   7. Pulmonary hypertension is indeterminant, inadequate TR jet.  - Will route to PCP Laurey Morale, MD for review and any future follow-up recommendations.    US Carotid 06/27/19: Summary:  - Right Carotid: Velocities in the right ICA are consistent with a 1-39% stenosis.  - Left Carotid: Velocities in the left ICA are consistent with a 1-39% stenosis.  - Vertebrals:   Bilateral vertebral arteries demonstrate antegrade flow.  - Subclavians: Normal flow hemodynamics were seen in bilateral subclavian arteries.    Cardiac event monitor 11/03/15-11/25/15: NSR, no arrhythmias.     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 Thornell Mule    MEDICATIONS: No current facility-administered medications for this encounter.    albuterol (VENTOLIN HFA) 108 (90 Base) MCG/ACT inhaler   Alcohol Swabs (ALCOHOL PREP) PADS   amLODipine (NORVASC) 5 MG tablet   aspirin 81 MG chewable tablet   Blood Glucose Monitoring Suppl (ONETOUCH VERIO) w/Device KIT   diclofenac (VOLTAREN) 75 MG EC tablet   empagliflozin (JARDIANCE) 25 MG TABS tablet   furosemide (LASIX) 20 MG tablet   glipiZIDE (GLUCOTROL) 10 MG tablet   Lancets (ONETOUCH ULTRASOFT) lancets   lisinopril (ZESTRIL) 10 MG tablet   metFORMIN (GLUCOPHAGE)  500 MG tablet   metoprolol succinate (TOPROL-XL) 50 MG 24 hr tablet   ONETOUCH VERIO test strip   rosuvastatin (CRESTOR) 40 MG tablet   spironolactone (ALDACTONE) 25 MG tablet    Myra Gianotti, PA-C Surgical Short Stay/Anesthesiology [facility] Phone [phone] [facility] Phone [phone] 08/15/2022 1:51 PM

## 2022-08-16 ENCOUNTER — Encounter (HOSPITAL_COMMUNITY): Payer: Self-pay | Admitting: Student

## 2022-08-16 ENCOUNTER — Ambulatory Visit (HOSPITAL_BASED_OUTPATIENT_CLINIC_OR_DEPARTMENT_OTHER): Payer: Medicare Other | Admitting: Vascular Surgery

## 2022-08-16 ENCOUNTER — Encounter (HOSPITAL_COMMUNITY)
Admission: RE | Disposition: A | Discharge status: Home / Self Care | Payer: Self-pay | Source: Home / Self Care | Attending: Student

## 2022-08-16 ENCOUNTER — Ambulatory Visit (HOSPITAL_COMMUNITY): Payer: Medicare Other

## 2022-08-16 ENCOUNTER — Other Ambulatory Visit: Payer: Self-pay

## 2022-08-16 ENCOUNTER — Ambulatory Visit (HOSPITAL_COMMUNITY)
Admission: RE | Admit: 2022-08-16 | Discharge: 2022-08-16 | Disposition: A | Discharge status: Home / Self Care | Payer: Medicare Other | Attending: Student | Admitting: Student

## 2022-08-16 ENCOUNTER — Ambulatory Visit (HOSPITAL_COMMUNITY): Payer: Medicare Other | Admitting: Vascular Surgery

## 2022-08-16 DIAGNOSIS — E119 Type 2 diabetes mellitus without complications: Secondary | ICD-10-CM

## 2022-08-16 DIAGNOSIS — Z87891 Personal history of nicotine dependence: Secondary | ICD-10-CM

## 2022-08-16 DIAGNOSIS — C3411 Malignant neoplasm of upper lobe, right bronchus or lung: Secondary | ICD-10-CM | POA: Diagnosis not present

## 2022-08-16 DIAGNOSIS — Z7984 Long term (current) use of oral hypoglycemic drugs: Secondary | ICD-10-CM | POA: Diagnosis not present

## 2022-08-16 DIAGNOSIS — C3490 Malignant neoplasm of unspecified part of unspecified bronchus or lung: Secondary | ICD-10-CM | POA: Diagnosis not present

## 2022-08-16 DIAGNOSIS — I6990 Unspecified sequelae of unspecified cerebrovascular disease: Secondary | ICD-10-CM | POA: Diagnosis not present

## 2022-08-16 DIAGNOSIS — I517 Cardiomegaly: Secondary | ICD-10-CM | POA: Diagnosis not present

## 2022-08-16 DIAGNOSIS — R911 Solitary pulmonary nodule: Secondary | ICD-10-CM

## 2022-08-16 DIAGNOSIS — I700 Atherosclerosis of aorta: Secondary | ICD-10-CM | POA: Diagnosis not present

## 2022-08-16 DIAGNOSIS — I10 Essential (primary) hypertension: Secondary | ICD-10-CM | POA: Diagnosis not present

## 2022-08-16 DIAGNOSIS — R918 Other nonspecific abnormal finding of lung field: Secondary | ICD-10-CM | POA: Diagnosis not present

## 2022-08-16 DIAGNOSIS — J984 Other disorders of lung: Secondary | ICD-10-CM | POA: Diagnosis not present

## 2022-08-16 DIAGNOSIS — M1990 Unspecified osteoarthritis, unspecified site: Secondary | ICD-10-CM | POA: Diagnosis not present

## 2022-08-16 DIAGNOSIS — I69351 Hemiplegia and hemiparesis following cerebral infarction affecting right dominant side: Secondary | ICD-10-CM | POA: Insufficient documentation

## 2022-08-16 HISTORY — PX: BRONCHIAL BRUSHINGS: SHX5108

## 2022-08-16 HISTORY — PX: BRONCHIAL BIOPSY: SHX5109

## 2022-08-16 HISTORY — PX: ENDOBRONCHIAL ULTRASOUND: SHX5096

## 2022-08-16 HISTORY — PX: BRONCHIAL NEEDLE ASPIRATION BIOPSY: SHX5106

## 2022-08-16 HISTORY — PX: VIDEO BRONCHOSCOPY WITH RADIAL ENDOBRONCHIAL ULTRASOUND: SHX6849

## 2022-08-16 LAB — BASIC METABOLIC PANEL
Anion gap: 12 (ref 5–15)
BUN: 47 mg/dL — ABNORMAL HIGH (ref 8–23)
CO2: 20 mmol/L — ABNORMAL LOW (ref 22–32)
Calcium: 10 mg/dL (ref 8.9–10.3)
Chloride: 107 mmol/L (ref 98–111)
Creatinine, Ser: 1.82 mg/dL — ABNORMAL HIGH (ref 0.44–1.00)
GFR, Estimated: 29 mL/min — ABNORMAL LOW (ref >60)
Glucose, Bld: 124 mg/dL — ABNORMAL HIGH (ref 70–99)
Potassium: 4.6 mmol/L (ref 3.5–5.1)
Sodium: 139 mmol/L (ref 135–145)

## 2022-08-16 LAB — CBC
HCT: 39.1 % (ref 36.0–46.0)
Hemoglobin: 12.2 g/dL (ref 12.0–15.0)
MCH: 30.6 pg (ref 26.0–34.0)
MCHC: 31.2 g/dL (ref 30.0–36.0)
MCV: 98 fL (ref 80.0–100.0)
Platelets: 245 10*3/uL (ref 150–400)
RBC: 3.99 MIL/uL (ref 3.87–5.11)
RDW: 15.5 % (ref 11.5–15.5)
WBC: 8.7 10*3/uL (ref 4.0–10.5)
nRBC: 0 % (ref 0.0–0.2)

## 2022-08-16 LAB — GLUCOSE, CAPILLARY
Glucose-Capillary: 132 mg/dL — ABNORMAL HIGH (ref 70–99)
Glucose-Capillary: 131 mg/dL — ABNORMAL HIGH (ref 70–99)

## 2022-08-16 SURGERY — BRONCHOSCOPY, WITH BIOPSY USING ELECTROMAGNETIC NAVIGATION
Anesthesia: General

## 2022-08-16 MED ORDER — PROPOFOL 500 MG/50ML IV EMUL
INTRAVENOUS | Status: DC | PRN
  Administered 2022-08-16: 150 ug/kg/min via INTRAVENOUS
  Administered 2022-08-16: 125 ug/kg/min via INTRAVENOUS

## 2022-08-16 MED ORDER — DEXAMETHASONE SODIUM PHOSPHATE 10 MG/ML IJ SOLN
INTRAMUSCULAR | Status: DC | PRN
  Administered 2022-08-16: 10 mg via INTRAVENOUS

## 2022-08-16 MED ORDER — CHLORHEXIDINE GLUCONATE 0.12 % MT SOLN
15.0000 mL | Freq: Once | OROMUCOSAL | Status: AC
  Administered 2022-08-16: 15 mL via OROMUCOSAL
  Filled 2022-08-16: qty 15

## 2022-08-16 MED ORDER — PROPOFOL 10 MG/ML IV BOLUS
INTRAVENOUS | Status: DC | PRN
  Administered 2022-08-16: 160 mg via INTRAVENOUS

## 2022-08-16 MED ORDER — METOPROLOL SUCCINATE ER 25 MG PO TB24: 50.0000 mg | ORAL_TABLET | Freq: Once | ORAL | Status: AC

## 2022-08-16 MED ORDER — SUGAMMADEX SODIUM 200 MG/2ML IV SOLN
INTRAVENOUS | Status: DC | PRN
  Administered 2022-08-16: 200 mg via INTRAVENOUS

## 2022-08-16 MED ORDER — ROCURONIUM BROMIDE 10 MG/ML (PF) SYRINGE
PREFILLED_SYRINGE | INTRAVENOUS | Status: DC | PRN
  Administered 2022-08-16: 60 mg via INTRAVENOUS
  Administered 2022-08-16: 10 mg via INTRAVENOUS

## 2022-08-16 MED ORDER — FENTANYL CITRATE (PF) 250 MCG/5ML IJ SOLN
INTRAMUSCULAR | Status: DC | PRN
  Administered 2022-08-16: 50 ug via INTRAVENOUS
  Administered 2022-08-16 (×2): 25 ug via INTRAVENOUS

## 2022-08-16 MED ORDER — LIDOCAINE 2% (20 MG/ML) 5 ML SYRINGE
INTRAMUSCULAR | Status: DC | PRN
  Administered 2022-08-16: 60 mg via INTRAVENOUS

## 2022-08-16 MED ORDER — ONDANSETRON HCL 4 MG/2ML IJ SOLN
INTRAMUSCULAR | Status: DC | PRN
  Administered 2022-08-16: 4 mg via INTRAVENOUS

## 2022-08-16 MED ORDER — LACTATED RINGERS IV SOLN: INTRAVENOUS | Status: DC

## 2022-08-16 MED ORDER — METOPROLOL SUCCINATE ER 25 MG PO TB24
ORAL_TABLET | ORAL | Status: AC
  Administered 2022-08-16: 50 mg via ORAL
  Filled 2022-08-16: qty 2

## 2022-08-16 MED ORDER — PHENYLEPHRINE HCL-NACL 20-0.9 MG/250ML-% IV SOLN
INTRAVENOUS | Status: DC | PRN
  Administered 2022-08-16: 25 ug/min via INTRAVENOUS

## 2022-08-16 NOTE — Op Note (Signed)
Video Bronchoscopy with Robotic Assisted Bronchoscopic Navigation   Date of Operation: 08/16/2022   Pre-op Diagnosis: RUL cavitary mass  Post-op Diagnosis: RUL cavitary mass  Surgeon: Walker Shadow  Anesthesia: General endotracheal anesthesia  Operation: Flexible video fiberoptic bronchoscopy with robotic assistance and biopsies.  Estimated Blood Loss: Minimal  Complications: None  Indications and History: [name] is a 76 y.o. female with history of smoking and RUL cavitary mass. The risks, benefits, complications, treatment options and expected outcomes were discussed with the patient.  The possibilities of pneumothorax, pneumonia, reaction to medication, pulmonary aspiration, perforation of a viscus, bleeding, failure to diagnose a condition and creating a complication requiring transfusion or operation were discussed with the patient who freely signed the consent.    Description of Procedure: The patient was seen in the Preoperative Area, was examined and was deemed appropriate to proceed.  The patient was taken to [facility] endoscopy room 3, identified as [name] and the procedure verified as Flexible Video Fiberoptic Bronchoscopy.  A Time Out was held and the above information confirmed.   Prior to the date of the procedure a high-resolution CT scan of the chest was performed. Utilizing ION software program a virtual tracheobronchial tree was generated to allow the creation of distinct navigation pathways to the patient's parenchymal abnormalities. After being taken to the operating room general anesthesia was initiated and the patient  was orally intubated. The video fiberoptic bronchoscope was introduced via the endotracheal tube and a general inspection was performed which showed normal right and left lung anatomy, aspiration of the bilateral mainstems was completed to remove any remaining secretions. Robotic catheter inserted into patient's endotracheal tube.   Target #1  RUL cavitary mass: The distinct navigation pathways prepared prior to this procedure were then utilized to navigate to patient's lesion identified on CT scan. CIOS imaging was used to aid navigation and confirm ideal location for biopsy. The robotic catheter was secured into place and the vision probe was withdrawn.  Lesion location was approximated using fluoroscopy and radial endobronchial ultrasound for peripheral targeting. Under fluoroscopic guidance transbronchial needle brushings, transbronchial needle biopsies, and transbronchial forceps biopsies were performed to be sent for cytology, micro.    EBUS: Ion scope removed and linear probe EBUS scope inserted, EBNA performed as documented below.  Samples Target RUL cavitary mass: 1. Transbronchial needle brushing from RUL cavitary mass 2. Transbronchial Wang needle biopsies from RUL cavitary mass 3. Transbronchial forceps biopsies from RUL cavitary mass 4. Endobronchial biopsies from RUL cavitary mass  EBUS: 1. EBNA at 105mm 7S with 4 passes 2. EBNA at 22mm 4R with 4 passes 3. EBNA at 46mm 11R with 4 passes  Plans:  The patient will be discharged from the PACU to home when recovered from anesthesia and after chest x-ray is reviewed. We will review the cytology, pathology and microbiology results with the patient when they become available. Outpatient followup will be with Dr. Erin Fulling or myself in 1-2 weeks.

## 2022-08-16 NOTE — Discharge Instructions (Signed)
-   OK to resume aspirin 81 mg daily tomorrow as long as bloody cough subsiding - bloody streaks or small clots of blood are normal but if you're coughing up clot the size of your thumb call [phone]. If persistent and especially if you develop worsening shortness of breath or sharp chest pain would plan to head to ED - I will be in touch in 3-5 business days with results

## 2022-08-16 NOTE — Anesthesia Procedure Notes (Signed)
Procedure Name: Intubation Date/Time: 08/16/2022 7:45 AM  Performed by: Kyung Rudd, CRNAPre-anesthesia Checklist: Patient identified, Emergency Drugs available, Suction available and Patient being monitored Patient Re-evaluated:Patient Re-evaluated prior to induction Oxygen Delivery Method: Circle system utilized Preoxygenation: Pre-oxygenation with 100% oxygen Induction Type: IV induction Ventilation: Mask ventilation without difficulty Laryngoscope Size: Mac and 4 Grade View: Grade I Tube type: Oral Tube size: 8.5 mm Number of attempts: 1 Airway Equipment and Method: Stylet Placement Confirmation: ETT inserted through vocal cords under direct vision, positive ETCO2 and breath sounds checked- equal and bilateral Secured at: 21 cm Tube secured with: Tape Dental Injury: Teeth and Oropharynx as per pre-operative assessment

## 2022-08-16 NOTE — Interval H&P Note (Signed)
History and Physical Interval Note:  08/16/2022 7:19 AM  [name]  has presented today for surgery, with the diagnosis of lung nodule.  The various methods of treatment have been discussed with the patient and family. After consideration of risks, benefits and other options for treatment, the patient has consented to  Procedure(s): ROBOTIC ASSISTED NAVIGATIONAL BRONCHOSCOPY (N/A) as a surgical intervention.  The patient's history has been reviewed, patient examined, no change in status, stable for surgery.  I have reviewed the patient's chart and labs.  Questions were answered to the patient's satisfaction.     Maryjane Hurter

## 2022-08-16 NOTE — Transfer of Care (Signed)
Immediate Anesthesia Transfer of Care Note  Patient: [name]  Procedure(s) Performed: ROBOTIC ASSISTED NAVIGATIONAL BRONCHOSCOPY VIDEO BRONCHOSCOPY WITH RADIAL ENDOBRONCHIAL ULTRASOUND BRONCHIAL BIOPSIES BRONCHIAL BRUSHINGS BRONCHIAL NEEDLE ASPIRATION BIOPSIES ENDOBRONCHIAL ULTRASOUND  Patient Location: PACU  Anesthesia Type:General  Level of Consciousness: awake, alert  and oriented  Airway & Oxygen Therapy: Patient Spontanous Breathing and Patient connected to face mask oxygen  Post-op Assessment: Report given to RN, Post -op Vital signs reviewed and stable and Patient moving all extremities  Post vital signs: Reviewed and stable  Last Vitals:  Vitals Value Taken Time  BP 134/64 08/16/22 0945  Temp 36.9 C 08/16/22 0941  Pulse 73 08/16/22 0949  Resp 24 08/16/22 0949  SpO2 95 % 08/16/22 0949  Vitals shown include unvalidated device data.  Last Pain:  Vitals:   08/16/22 0638  TempSrc:   PainSc: 0-No pain      Patients Stated Pain Goal: 3 (04/27/55 1537)  Complications: No notable events documented.

## 2022-08-17 NOTE — Anesthesia Postprocedure Evaluation (Signed)
Anesthesia Post Note  Patient: [name]  Procedure(s) Performed: ROBOTIC ASSISTED NAVIGATIONAL BRONCHOSCOPY VIDEO BRONCHOSCOPY WITH RADIAL ENDOBRONCHIAL ULTRASOUND BRONCHIAL BIOPSIES BRONCHIAL BRUSHINGS BRONCHIAL NEEDLE ASPIRATION BIOPSIES ENDOBRONCHIAL ULTRASOUND     Patient location during evaluation: PACU Anesthesia Type: General Level of consciousness: awake and alert Pain management: pain level controlled Vital Signs Assessment: post-procedure vital signs reviewed and stable Respiratory status: spontaneous breathing, nonlabored ventilation and respiratory function stable Cardiovascular status: blood pressure returned to baseline and stable Postop Assessment: no apparent nausea or vomiting Anesthetic complications: no   No notable events documented.  Last Vitals:  Vitals:   08/16/22 1000 08/16/22 1015  BP: 130/65 110/63  Pulse: 71 74  Resp: 12 12  Temp:  36.9 C  SpO2: 96% 91%    Last Pain:  Vitals:   08/16/22 1015  TempSrc:   PainSc: 0-No pain                 Mase Dhondt

## 2022-08-18 LAB — ACID FAST SMEAR (AFB, MYCOBACTERIA): Acid Fast Smear: NEGATIVE

## 2022-08-19 ENCOUNTER — Encounter (HOSPITAL_COMMUNITY): Payer: Self-pay | Admitting: Student

## 2022-08-21 ENCOUNTER — Telehealth: Payer: Self-pay | Admitting: Student

## 2022-08-21 DIAGNOSIS — C3491 Malignant neoplasm of unspecified part of right bronchus or lung: Secondary | ICD-10-CM

## 2022-08-21 DIAGNOSIS — C3490 Malignant neoplasm of unspecified part of unspecified bronchus or lung: Secondary | ICD-10-CM | POA: Diagnosis not present

## 2022-08-21 LAB — AEROBIC/ANAEROBIC CULTURE W GRAM STAIN (SURGICAL/DEEP WOUND)
Culture: NO GROWTH
Gram Stain: NONE SEEN

## 2022-08-21 LAB — CYTOLOGY - NON PAP

## 2022-08-21 MED ORDER — ALBUTEROL SULFATE HFA 108 (90 BASE) MCG/ACT IN AERS: 2.0000 | INHALATION_SPRAY | Freq: Four times a day (QID) | RESPIRATORY_TRACT | 2 refills | Status: AC | PRN

## 2022-08-21 NOTE — Telephone Encounter (Signed)
Called and discussed bronch results. 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 I have placed referral to radiation oncology. I recommended/offered to make referral to [facility] as well as order PFTs, TTE in case she were to be offered resection. She says she is absolutely not interested in surgery. Offered to order MRI as well, but she feels a little overwhelmed with this diagnosis, declines to schedule it now, and given it may be early stage disease I defer to rad onc whether they would like to pursue this.   I have requested sooner follow up with Dr. Erin Fulling to discuss these results, potential workup/referrals as well.

## 2022-08-21 NOTE — Telephone Encounter (Signed)
Can we schedule with Dr. Erin Fulling, next available?  Thanks!

## 2022-08-21 NOTE — Telephone Encounter (Signed)
ATC pt to set up sooner f/u. She is currently schedule for 3 mon f/u in December. However, busy tone was received. Will try again at later time.

## 2022-08-23 NOTE — Telephone Encounter (Signed)
Thanks Dr. Verlee Monte.  I spoke with patient on the phone, I was able to reach her on the following number [phone] which is listed under her son's name, Benny. She would like to move forward with CT surgery referral and med onc referral. I have placed orders for both. I have also ordered brain MRI for staging.   Cherina, can you reach out to patient once all this is scheduled to make sure is understands everything for appointments and testing. Patient is very overwhelmed but she says do what we need to do in order to get her the appropriate treatment.  Thanks, JD

## 2022-08-27 ENCOUNTER — Telehealth: Payer: Self-pay | Admitting: Internal Medicine

## 2022-08-27 NOTE — Progress Notes (Signed)
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   MRI Brain 08/30/2022:  Bronchoscopy 08/16/2022  PET 08/15/2022: Right upper lobe hypermetabolic cavitary lung mass, likely squamous cell carcinoma.  Upper normal sized right paratracheal node with low-level, indeterminate hypermetabolism. Otherwise, no evidence of tracer avid metastasis.  CT 07/26/2022: Irregular thick wall cavitary lesion in the right upper lobe, slightly increased in size from the prior exam. The possibility of underlying neoplasm can not be excluded.  CT CAP 05/20/2022: Thick walled cavitary lung mass within the posterior right upper lobe is identified and is worrisome for primary bronchogenic carcinoma.   Biopsies of Right Upper Lobe Lung Nodule 08/16/2022     Tobacco/Marijuana/Snuff/ETOH use: Former Smoker, quit 2022.    Past/Anticipated interventions by cardiothoracic surgery, if any:  Dr. Kipp Brood 09/14/2022   Past/Anticipated interventions by medical oncology, if any:  Dr. Julien Nordmann 09/11/2022   Signs/Symptoms Weight changes, if any: Reports weight loss recently, unsure the amount and timeframe.  Respiratory complaints, if any: She reports increased SOB and difficulty with breathing, worse when laying down. Hemoptysis, if any: Reports dry cough daily. Pain issues, if any: No   SAFETY ISSUES: Prior radiation? No Pacemaker/ICD? No  Possible current pregnancy? Postmenopausal Is the patient on methotrexate? No  Current Complaints / other details:   -She reports general itching daily.

## 2022-08-27 NOTE — Telephone Encounter (Signed)
Scheduled appointment per 10/02 . Patient is aware of appointment date and time. Patient is aware to arrive 15 mins prior to appointment time and to bring updated insurance cards. Patient is aware of location.

## 2022-08-27 NOTE — Progress Notes (Signed)
Radiation Oncology         [phone] ________________________________  Name: [name]        MRN: [id number]  Date of Service: 08/28/2022 DOB: 08-16-1946  CC:Fry, Ishmael Holter, MD  Maryjane Hurter, MD     REFERRING PHYSICIAN: Maryjane Hurter, MD   DIAGNOSIS: There were no encounter diagnoses.   HISTORY OF PRESENT ILLNESS: [name] is a 76 y.o. female seen at the request of Dr. Verlee Monte for a new diagnosis this of non-small cell lung cancer.  The patient had been admitted in the summer 2023 with sepsis and a CT chest abdomen and pelvis during her work-up without contrast showed a thick walled cavitary lung mass within the posterior right upper lobe no evidence of nodal or distant metastatic disease was identified, and she had follow-up with a CT scan without contrast on 07/26/2022 persisting was an irregular thick-walled cavitary lesion in the right upper lobe measuring up to 4.1 cm there was central nodular component measuring 1.1 cm and a subpleural nodule in the right upper lobe unchanged from prior.  No new nodule or appearance of new disease was present however she did have a prominent lymph node in the mediastinum in the right paratracheal space measuring 1 cm.  The remainder was limited without contrast.  She underwent a PET scan on 08/15/2022 which showed hypermetabolic activity in the right upper lobe mass with an SUV of 24.8.  Her right paratracheal lymph node measured 9 mm with an SUV of 3.1.  Hypermetabolic activity was also seen in the left adrenal gland with an SUV of 3.9 and a nodule measuring 2.5 cm.  She did undergo bronchoscopy with navigation on 08/16/2022.  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  She is scheduled to undergo an MRI of the brain on Thursday, and to see Dr. Julien Nordmann on 09/11/2022.    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   Hypertension    Stroke ([place]) 06/26/2019   weakness R side       PAST SURGICAL HISTORY: Past Surgical History:  Procedure Laterality Date   BRONCHIAL BIOPSY  08/16/2022   Procedure: BRONCHIAL BIOPSIES;  Surgeon: Maryjane Hurter, MD;  Location: [place];  Service: Pulmonary;;   BRONCHIAL BRUSHINGS  08/16/2022   Procedure: BRONCHIAL BRUSHINGS;  Surgeon: Maryjane Hurter, MD;  Location: [place];  Service: Pulmonary;;   BRONCHIAL NEEDLE ASPIRATION BIOPSY  08/16/2022   Procedure: BRONCHIAL NEEDLE ASPIRATION BIOPSIES;  Surgeon: Maryjane Hurter, MD;  Location: [place];  Service: Pulmonary;;   ENDOBRONCHIAL ULTRASOUND  08/16/2022   Procedure: ENDOBRONCHIAL ULTRASOUND;  Surgeon: Maryjane Hurter, MD;  Location: [facility] ENDOSCOPY;  Service: Pulmonary;;   EYE SURGERY Right 11/2018   implant   JOINT REPLACEMENT Bilateral    Total Knee Dr Noemi Chapel   NECK SURGERY     Remove a tumor   TYMPANOSTOMY  Dr. Thornell Mule   VIDEO BRONCHOSCOPY WITH RADIAL ENDOBRONCHIAL ULTRASOUND  08/16/2022   Procedure: VIDEO BRONCHOSCOPY WITH RADIAL ENDOBRONCHIAL ULTRASOUND;  Surgeon: Maryjane Hurter, MD;  Location[facility] ENDOSCOPY;  Service: Pulmonary;;     FAMILY HISTORY:  Family History  Problem Relation Age of Onset   Heart disease Father        No details   Heart failure Mother    Diabetes Neg Hx    Colon cancer Neg Hx    Esophageal cancer Neg Hx    Rectal cancer Neg Hx    Stomach cancer Neg Hx      SOCIAL HISTORY:  reports that  she has quit smoking. Her smoking use included cigarettes. She started smoking about 53 years ago. She has a 26.50 pack-year smoking history. She has never used smokeless tobacco. She reports that she does not currently use alcohol. She reports that she does not use drugs.  The patient is widowed and lives in [place].   ALLERGIES: Actonel [risedronate] and Hydromet [hydrocodone bit-homatrop mbr]   MEDICATIONS:  Current Outpatient Medications  Medication Sig Dispense Refill   albuterol (VENTOLIN HFA) 108 (90 Base) MCG/ACT inhaler Inhale 2 puffs into the lungs every 6 (six) hours as needed for wheezing or shortness of breath. 8 g 2   Alcohol Swabs (ALCOHOL PREP) PADS Use to test 1 X day and diagnosis code is E 11.9 100 each 1   amLODipine (NORVASC) 5 MG tablet Take 1 tablet by mouth once daily 90 tablet 0   aspirin 81 MG chewable tablet Chew 1 tablet (81 mg total) by mouth daily. 30 tablet 3   Blood Glucose Monitoring Suppl (ONETOUCH VERIO) w/Device KIT Use to check blood sugar two times daily 1 kit 0   diclofenac (VOLTAREN) 75 MG EC tablet Take 1 tablet (75 mg total) by mouth 2 (two) times daily. 180 tablet 3   empagliflozin (JARDIANCE) 25 MG TABS tablet Take 1 tablet (25 mg total) by mouth daily before breakfast. 90 tablet 2   furosemide (LASIX) 20 MG tablet Take 1 tablet by mouth once daily 90 tablet 0   glipiZIDE (GLUCOTROL) 10 MG tablet Take 1 tablet (10 mg total) by mouth 2 (two) times daily before a meal. 180 tablet 1   Lancets (ONETOUCH ULTRASOFT) lancets USE   TO CHECK GLUCOSE ONCE DAILY 100 each 0   lisinopril (ZESTRIL) 10 MG tablet Take 2 tablets by mouth once daily 180 tablet 0   metFORMIN (GLUCOPHAGE) 500 MG tablet Take 1 tablet (500 mg total) by mouth 2 (two) times daily with a meal. 180 tablet 3   metoprolol succinate (TOPROL-XL) 50 MG 24 hr tablet TAKE ONE TABLET BY MOUTH WITH OR IMMEDIATELY FOLLOWING A MEAL 90 tablet 0   ONETOUCH VERIO test strip USE 1 STRIP TO CHECK GLUCOSE  ONCE DAILY 50 each 0   rosuvastatin (CRESTOR) 40 MG tablet Take 1 tablet (40 mg total) by mouth daily. 90 tablet 3   spironolactone (ALDACTONE) 25 MG tablet Take 1 tablet by mouth once daily 90 tablet 0   No current facility-administered medications for this visit.     REVIEW OF SYSTEMS: On review of systems, the patient reports that ***     PHYSICAL EXAM:  Wt Readings from Last 3 Encounters:  08/16/22 218 lb (98.9 kg)  08/01/22 218 lb 3.2 oz (99 kg)  07/16/22 222 lb (100.7 kg)   Temp Readings from Last 3 Encounters:  08/16/22 98.4 F (36.9 C)  08/01/22 98.5 F (  36.9 C) (Oral)  06/19/22 98.2 F (36.8 C) (Oral)   BP Readings from Last 3 Encounters:  08/16/22 110/63  08/01/22 (!) 140/72  06/19/22 140/60   Pulse Readings from Last 3 Encounters:  08/16/22 74  08/01/22 70  06/19/22 (!) 55    /10  In general this is a well appearing African-American female in no acute distress.  She's alert and oriented x4 and appropriate throughout the examination. Cardiopulmonary assessment is negative for acute distress and she exhibits normal effort.     ECOG = ***  0 - Asymptomatic (Fully active, able to carry on all predisease activities without restriction)  1 - Symptomatic but completely ambulatory (Restricted in physically strenuous activity but ambulatory and able to carry out work of a light or sedentary nature. For example, light housework, office work)  2 - Symptomatic, <50% in bed during the day (Ambulatory and capable of all self care but unable to carry out any work activities. Up and about more than 50% of waking hours)  3 - Symptomatic, >50% in bed, but not bedbound (Capable of only limited self-care, confined to bed or chair 50% or more of waking hours)  4 - Bedbound (Completely disabled. Cannot carry on any self-care. Totally confined to bed or chair)  5 - Death   Eustace Pen MM, Creech RH, Tormey DC, et al. [phone]). "Toxicity and response criteria of the [facility]". [place] Oncol. 5 (6): 649-55    LABORATORY DATA:  Lab Results  Component Value Date   WBC 8.7 08/16/2022   HGB 12.2 08/16/2022   HCT 39.1 08/16/2022   MCV 98.0 08/16/2022   PLT 245 08/16/2022   Lab Results  Component Value Date   NA 139 08/16/2022   K 4.6 08/16/2022   CL 107 08/16/2022   CO2 20 (L) 08/16/2022   Lab Results  Component Value Date   ALT 55 (H) 05/21/2022   AST 35 05/21/2022   ALKPHOS 56 05/21/2022   BILITOT 0.8 05/21/2022      RADIOGRAPHY: DG Chest Port 1 View  Result Date: 08/16/2022 CLINICAL DATA:  [id number] EXAM: PORTABLE CHEST 1 VIEW COMPARISON:  June 19, 2022 FINDINGS: Mildly enlarged cardiac silhouette. Calcific atherosclerotic disease and tortuosity of the aorta. Cavitary lesion in the right upper lobe of the lung is stable radiographically. No pneumothorax. IMPRESSION: 1. Stable cavitary lesion in the right upper lobe of the lung. 2. No pneumothorax. Electronically Signed   By: Fidela Salisbury M.D.   On: 08/16/2022 11:00   DG C-ARM BRONCHOSCOPY  Result Date: 08/16/2022 C-ARM BRONCHOSCOPY: Fluoroscopy was utilized by the requesting physician.  No radiographic interpretation.   NM PET Image Initial (PI) Skull Base To Thigh  Result Date: 08/15/2022 CLINICAL DATA:  Initial treatment strategy for new diagnosis of non-small-cell lung cancer. EXAM: NUCLEAR MEDICINE PET SKULL BASE TO THIGH TECHNIQUE: 10.8 mCi F-18 FDG was injected intravenously. Full-ring PET imaging was performed from the skull base to thigh after the radiotracer. CT data was obtained and used for attenuation correction and anatomic localization. Fasting blood glucose: 103 mg/dl COMPARISON:  Chest CTs including 07/26/2022. Abdominopelvic CT of 05/20/2022. FINDINGS: Mediastinal blood pool activity: SUV max 2.7 Liver activity: SUV max NA NECK: No areas of abnormal hypermetabolism. Incidental CT findings: Bilateral carotid atherosclerosis. No cervical adenopathy.  Fluid in the left maxillary sinus. CHEST: A right paratracheal node measures 9 mm and a S.U.V. max of 3.1 on 61/4. Similar in size back to 05/20/2022. Posterior right upper  lobe 4.0 x 3.6 cm irregular cavitary lesion demonstrates hypermetabolism within its solid portion, including at a S.U.V. max of 24.8 on 58/4. Incidental CT findings: Deferred to recent diagnostic CT. Aortic and coronary artery calcification. Mild cardiomegaly. Tiny hiatal hernia. The previously described right upper lobe pulmonary nodule is not well delineated today and is either contiguous or adjacent to the dominant mass. ABDOMEN/PELVIS: Hypermetabolism corresponding to low density left adrenal thickening and nodularity. 2.5 x 2.3 cm and a S.U.V. max of 3.9 on 101/4. No abdominopelvic nodal hypermetabolism. Incidental CT findings: Right adrenal thickening. Abdominal aortic atherosclerosis. Normal noncontrast appearance of the liver, pancreas, spleen, kidneys, uterus. Extensive colonic diverticulosis. SKELETON: Right-sided hypermetabolism about the L3-4 junction is favored to be degenerative at a S.U.V. max of 6.5. Hypermetabolism corresponding to a fifth anterior right rib healing fracture on 74/4. This is new since 05/20/2022 but was present on 07/26/2022. Incidental CT findings: None. IMPRESSION: 1. 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 2. Upper normal sized right paratracheal node with low-level, indeterminate hypermetabolism. 3. Otherwise, no evidence of tracer avid metastasis. 4. Left adrenal adenoma with low-level hypermetabolism. In the absence of clinically indicated signs/symptoms require(s) no independent follow-up. 5. Incidental findings, including: Coronary artery atherosclerosis. Aortic Atherosclerosis (ICD10-I70.0). Tiny hiatal hernia. Sinus disease. Electronically Signed   By: Abigail Miyamoto M.D.   On: 08/15/2022 14:26       IMPRESSION/PLAN: 1. Stage IB [id number], NSCLC, Squamous  Cell Carcinoma of the RUL. Dr. Lisbeth Renshaw discusses the pathology findings and reviews the nature of ***  We discussed the risks, benefits, short, and long term effects of radiotherapy, as well as the curative intent, and the patient is interested in proceeding. Dr. Lisbeth Renshaw discusses the delivery and logistics of radiotherapy and anticipates a course of *** weeks of radiotherapy. Written consent is obtained and placed in the chart, a copy was provided to the patient. The patient will be contacted to coordinate treatment planning by our simulation department.     In a visit lasting *** minutes, greater than 50% of the time was spent face to face discussing the patient's condition, in preparation for the discussion, and coordinating the patient's care.   The above documentation reflects my direct findings during this shared patient visit. Please see the separate note by Dr. Lisbeth Renshaw on this date for the remainder of the patient's plan of care.    Carola Rhine, [facility]   **Disclaimer: This note was dictated with voice recognition software. Similar sounding words can inadvertently be transcribed and this note may contain transcription errors which may not have been corrected upon publication of note.**

## 2022-08-28 ENCOUNTER — Ambulatory Visit
Admission: RE | Admit: 2022-08-28 | Discharge: 2022-08-28 | Disposition: A | Discharge status: Home / Self Care | Payer: Medicare Other | Source: Ambulatory Visit | Attending: Radiation Oncology | Admitting: Radiation Oncology

## 2022-08-28 ENCOUNTER — Encounter: Payer: Self-pay | Admitting: Radiation Oncology

## 2022-08-28 VITALS — BP 152/68 | HR 76 | Temp 98.8°F | Resp 20 | Ht 64.5 in

## 2022-08-28 DIAGNOSIS — E785 Hyperlipidemia, unspecified: Secondary | ICD-10-CM | POA: Insufficient documentation

## 2022-08-28 DIAGNOSIS — Z791 Long term (current) use of non-steroidal anti-inflammatories (NSAID): Secondary | ICD-10-CM | POA: Diagnosis not present

## 2022-08-28 DIAGNOSIS — C3411 Malignant neoplasm of upper lobe, right bronchus or lung: Secondary | ICD-10-CM | POA: Diagnosis not present

## 2022-08-28 DIAGNOSIS — Z8673 Personal history of transient ischemic attack (TIA), and cerebral infarction without residual deficits: Secondary | ICD-10-CM | POA: Insufficient documentation

## 2022-08-28 DIAGNOSIS — Z79899 Other long term (current) drug therapy: Secondary | ICD-10-CM | POA: Insufficient documentation

## 2022-08-28 DIAGNOSIS — Z87891 Personal history of nicotine dependence: Secondary | ICD-10-CM | POA: Insufficient documentation

## 2022-08-28 DIAGNOSIS — Z7984 Long term (current) use of oral hypoglycemic drugs: Secondary | ICD-10-CM | POA: Insufficient documentation

## 2022-08-28 DIAGNOSIS — I6523 Occlusion and stenosis of bilateral carotid arteries: Secondary | ICD-10-CM | POA: Insufficient documentation

## 2022-08-28 DIAGNOSIS — E279 Disorder of adrenal gland, unspecified: Secondary | ICD-10-CM | POA: Insufficient documentation

## 2022-08-28 DIAGNOSIS — R011 Cardiac murmur, unspecified: Secondary | ICD-10-CM | POA: Diagnosis not present

## 2022-08-28 DIAGNOSIS — E1136 Type 2 diabetes mellitus with diabetic cataract: Secondary | ICD-10-CM | POA: Diagnosis not present

## 2022-08-28 DIAGNOSIS — Z800 Family history of malignant neoplasm of digestive organs: Secondary | ICD-10-CM | POA: Insufficient documentation

## 2022-08-28 DIAGNOSIS — E119 Type 2 diabetes mellitus without complications: Secondary | ICD-10-CM | POA: Diagnosis not present

## 2022-08-28 DIAGNOSIS — I10 Essential (primary) hypertension: Secondary | ICD-10-CM | POA: Insufficient documentation

## 2022-08-29 ENCOUNTER — Ambulatory Visit: Payer: Medicare Other

## 2022-08-30 ENCOUNTER — Ambulatory Visit (HOSPITAL_COMMUNITY)
Admission: RE | Admit: 2022-08-30 | Discharge: 2022-08-30 | Disposition: A | Discharge status: Home / Self Care | Payer: Medicare Other | Source: Ambulatory Visit | Attending: Pulmonary Disease | Admitting: Pulmonary Disease

## 2022-08-30 ENCOUNTER — Other Ambulatory Visit: Payer: Self-pay | Admitting: *Deleted

## 2022-08-30 ENCOUNTER — Encounter (HOSPITAL_COMMUNITY): Payer: Self-pay

## 2022-08-30 DIAGNOSIS — C801 Malignant (primary) neoplasm, unspecified: Secondary | ICD-10-CM | POA: Diagnosis not present

## 2022-08-30 DIAGNOSIS — C3491 Malignant neoplasm of unspecified part of right bronchus or lung: Secondary | ICD-10-CM | POA: Insufficient documentation

## 2022-08-30 DIAGNOSIS — I6782 Cerebral ischemia: Secondary | ICD-10-CM | POA: Diagnosis not present

## 2022-08-30 NOTE — Progress Notes (Signed)
The proposed treatment discussed in conference is for discussion purpose only and is not a binding recommendation.  The patients have not been physically examined, or presented with their treatment options.  Therefore, final treatment plans cannot be decided.  

## 2022-08-31 ENCOUNTER — Encounter: Payer: Medicare Other | Admitting: Thoracic Surgery (Cardiothoracic Vascular Surgery)

## 2022-09-03 DIAGNOSIS — C3490 Malignant neoplasm of unspecified part of unspecified bronchus or lung: Secondary | ICD-10-CM | POA: Diagnosis not present

## 2022-09-04 ENCOUNTER — Encounter (HOSPITAL_COMMUNITY): Payer: Self-pay

## 2022-09-06 LAB — CULTURE, FUNGUS WITHOUT SMEAR

## 2022-09-11 ENCOUNTER — Other Ambulatory Visit: Payer: Self-pay

## 2022-09-11 ENCOUNTER — Other Ambulatory Visit: Payer: Self-pay | Admitting: Internal Medicine

## 2022-09-11 ENCOUNTER — Inpatient Hospital Stay: Payer: Medicare Other

## 2022-09-11 ENCOUNTER — Inpatient Hospital Stay: Payer: Medicare Other | Attending: Internal Medicine | Admitting: Internal Medicine

## 2022-09-11 VITALS — BP 124/61 | HR 72 | Temp 98.1°F | Resp 15 | Wt 214.1 lb

## 2022-09-11 DIAGNOSIS — C3411 Malignant neoplasm of upper lobe, right bronchus or lung: Secondary | ICD-10-CM

## 2022-09-11 DIAGNOSIS — C3490 Malignant neoplasm of unspecified part of unspecified bronchus or lung: Secondary | ICD-10-CM | POA: Diagnosis not present

## 2022-09-11 LAB — CBC WITH DIFFERENTIAL (CANCER CENTER ONLY)
Abs Immature Granulocytes: 0.08 10*3/uL — ABNORMAL HIGH (ref 0.00–0.07)
Basophils Absolute: 0 10*3/uL (ref 0.0–0.1)
Basophils Relative: 1 %
Eosinophils Absolute: 0.3 10*3/uL (ref 0.0–0.5)
Eosinophils Relative: 5 %
HCT: 35.1 % — ABNORMAL LOW (ref 36.0–46.0)
Hemoglobin: 11.4 g/dL — ABNORMAL LOW (ref 12.0–15.0)
Immature Granulocytes: 1 %
Lymphocytes Relative: 37 %
Lymphs Abs: 2.4 10*3/uL (ref 0.7–4.0)
MCH: 30.8 pg (ref 26.0–34.0)
MCHC: 32.5 g/dL (ref 30.0–36.0)
MCV: 94.9 fL (ref 80.0–100.0)
Monocytes Absolute: 0.9 10*3/uL (ref 0.1–1.0)
Monocytes Relative: 14 %
Neutro Abs: 2.8 10*3/uL (ref 1.7–7.7)
Neutrophils Relative %: 42 %
Platelet Count: 324 10*3/uL (ref 150–400)
RBC: 3.7 MIL/uL — ABNORMAL LOW (ref 3.87–5.11)
RDW: 16.1 % — ABNORMAL HIGH (ref 11.5–15.5)
WBC Count: 6.5 10*3/uL (ref 4.0–10.5)
nRBC: 0 % (ref 0.0–0.2)

## 2022-09-11 LAB — CMP (CANCER CENTER ONLY)
ALT: 35 U/L (ref 0–44)
AST: 23 U/L (ref 15–41)
Albumin: 3.7 g/dL (ref 3.5–5.0)
Alkaline Phosphatase: 64 U/L (ref 38–126)
Anion gap: 9 (ref 5–15)
BUN: 52 mg/dL — ABNORMAL HIGH (ref 8–23)
CO2: 22 mmol/L (ref 22–32)
Calcium: 9.9 mg/dL (ref 8.9–10.3)
Chloride: 107 mmol/L (ref 98–111)
Creatinine: 2.24 mg/dL — ABNORMAL HIGH (ref 0.44–1.00)
GFR, Estimated: 22 mL/min — ABNORMAL LOW (ref >60)
Glucose, Bld: 202 mg/dL — ABNORMAL HIGH (ref 70–99)
Potassium: 4.9 mmol/L (ref 3.5–5.1)
Sodium: 138 mmol/L (ref 135–145)
Total Bilirubin: 0.3 mg/dL (ref 0.3–1.2)
Total Protein: 8.1 g/dL (ref 6.5–8.1)

## 2022-09-11 NOTE — Progress Notes (Signed)
[place] [phone]   [phone]  CONSULT NOTE  REFERRING PHYSICIAN: Dr. Leslye Peer  REASON FOR CONSULTATION:  76 years old African-American female recently diagnosed with lung cancer  HPI [name] is a 76 y.o. 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  She was admitted to the hospital in June 2023 with suspicious aspiration pneumonia versus cavitary mass when she presented with fever cough sore throat and swollen gland in her neck.  She had CT scan of the chest, abdomen and pelvis on 05/20/2022 and it showed within the posterior right upper lobe there was a thick walled cavitary lung mass measuring 3.9 x 2.8 x 3.8 cm.  There was a small satellite nodule measuring 0.7 cm within the subpleural aspect of the posterior right upper lobe.  There was no enlarged mediastinal, hilar or axillary lymph nodes.  There was no evidence of metastatic disease in the abdomen or pelvis.  The patient was treated with a course of antibiotics and repeat CT scan of the chest without contrast on 8/31 2023 showed redemonstration of an irregular thick-walled cavitary lesion in the posterior segment of the right upper lobe measuring 4.1 x 3.3 x 3.5 cm slightly increased in size from the previous exam.  There was a central nodular component measuring approximately 1.1 cm and a 0.7 cm subpleural nodule noted in the right upper lobe that are unchanged.  There was also prominent lymph node present in the mediastinum and the right paratracheal space measuring 1.0 cm.  A PET scan was performed on 08/15/2020 and that showed right upper lobe hypermetabolic cavitary lung mass likely squamous cell carcinoma.  There was upper normal-sized right paratracheal node with low-level indeterminate hypermetabolism and  no other evidence of tracer avid metastasis.  There was left adrenal adenoma with low-level hypermetabolism.  On 08/16/2022 the patient underwent video bronchoscopy with robotic assisted bronchoscopic navigation by Dr. Verlee Monte.  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 Six immunohistochemical stains performed with adequate control.  The neoplastic cells are diffusely and strongly positive for the squamous markers p40.  They are negative for the pulmonary adeno markers TTF-1 and Napsin A.  They are also negative for cytokeratin 7.  They are negative for the neuroendocrine markers CD56 and synaptophysin.  The site of histomorphology and this immunohistochemical pattern support the above diagnosis.  The patient also had MRI of the brain without contrast on August 30, 2022 and that showed no evidence of acute intracranial abnormalities but there was chronic microvascular ischemic changes. She was referred to me today for evaluation and recommendation regarding treatment of her condition. She is supposed to see Dr. Kipp Brood on September 14, 2022 for surgical evaluation. When seen today she is feeling fine with no complaints except for mild fatigue and shortness of breath with exertion but no significant chest pain or hemoptysis.  She had mild cough.  She has no nausea, vomiting, diarrhea or constipation.  She has no headache or visual changes. Family history significant for mother and father with heart disease. The patient is a widow and has 2 children.  She used to work at [employer].  She was accompanied today by her son Truman Hayward and she has another son Ray.  She has a history  of smoking 1 pack/day for around 60 years and quit 1 year ago.  She also has a history of alcohol drinking in the past but not recently and no history of drug abuse.   HPI  Past Medical History:  Diagnosis Date    Acute bronchitis 07/22/2008   Arthritis    Cataract    Diabetes mellitus without complication ([place]) 76/19/5093   Type II   Heart murmur    History of CVA (cerebrovascular accident)    HYPERLIPIDEMIA, MIXED 07/18/2007   Hypertension    Stroke ([place]) 06/26/2019   weakness R side    Past Surgical History:  Procedure Laterality Date   BRONCHIAL BIOPSY  08/16/2022   Procedure: BRONCHIAL BIOPSIES;  Surgeon: Maryjane Hurter, MD;  Location: [facility] ENDOSCOPY;  Service: Pulmonary;;   BRONCHIAL BRUSHINGS  08/16/2022   Procedure: BRONCHIAL BRUSHINGS;  Surgeon: Maryjane Hurter, MD;  Location: [facility] ENDOSCOPY;  Service: Pulmonary;;   BRONCHIAL NEEDLE ASPIRATION BIOPSY  08/16/2022   Procedure: BRONCHIAL NEEDLE ASPIRATION BIOPSIES;  Surgeon: Maryjane Hurter, MD;  Location: [facility] ENDOSCOPY;  Service: Pulmonary;;   ENDOBRONCHIAL ULTRASOUND  08/16/2022   Procedure: ENDOBRONCHIAL ULTRASOUND;  Surgeon: Maryjane Hurter, MD;  Location: [facility] ENDOSCOPY;  Service: Pulmonary;;   EYE SURGERY Right 11/2018   implant   JOINT REPLACEMENT Bilateral    Total Knee Dr Noemi Chapel   NECK SURGERY     Remove a tumor   TYMPANOSTOMY     Dr. Thornell Mule   VIDEO BRONCHOSCOPY WITH RADIAL ENDOBRONCHIAL ULTRASOUND  08/16/2022   Procedure: VIDEO BRONCHOSCOPY WITH RADIAL ENDOBRONCHIAL ULTRASOUND;  Surgeon: Maryjane Hurter, MD;  Location: [facility] ENDOSCOPY;  Service: Pulmonary;;    Family History  Problem Relation Age of Onset   Heart disease Father        No details   Heart failure Mother    Diabetes Neg Hx    Colon cancer Neg Hx    Esophageal cancer Neg Hx    Rectal cancer Neg Hx    Stomach cancer Neg Hx     Social History Social History   Tobacco Use   Smoking status: Former    Packs/day: 0.50    Years: 53.00    Total pack years: 26.50    Types: Cigarettes    Start date: 11/26/1968   Smokeless tobacco: Never   Tobacco comments:    trying to quit;  not really motivated at present  Vaping Use   Vaping Use: Never used   Substance Use Topics   Alcohol use: Not Currently    Alcohol/week: 0.0 standard drinks of alcohol    Comment: rare   Drug use: No    Allergies  Allergen Reactions   Actonel [Risedronate] Other (See Comments)    It made her "feel like I'm going backwards"    Hydromet [Hydrocodone Bit-Homatrop Mbr] Itching    Current Outpatient Medications  Medication Sig Dispense Refill   albuterol (VENTOLIN HFA) 108 (90 Base) MCG/ACT inhaler Inhale 2 puffs into the lungs every 6 (six) hours as needed for wheezing or shortness of breath. 8 g 2   Alcohol Swabs (ALCOHOL PREP) PADS Use to test 1 X day and diagnosis code is E 11.9 100 each 1   amLODipine (NORVASC) 5 MG tablet Take 1 tablet by mouth once daily 90 tablet 0   aspirin 81 MG chewable tablet Chew 1 tablet (81 mg total) by mouth daily. 30 tablet 3   Blood Glucose Monitoring Suppl (ONETOUCH VERIO) w/Device KIT Use to check  blood sugar two times daily 1 kit 0   diclofenac (VOLTAREN) 75 MG EC tablet Take 1 tablet (75 mg total) by mouth 2 (two) times daily. 180 tablet 3   empagliflozin (JARDIANCE) 25 MG TABS tablet Take 1 tablet (25 mg total) by mouth daily before breakfast. 90 tablet 2   furosemide (LASIX) 20 MG tablet Take 1 tablet by mouth once daily 90 tablet 0   glipiZIDE (GLUCOTROL) 10 MG tablet Take 1 tablet (10 mg total) by mouth 2 (two) times daily before a meal. 180 tablet 1   Lancets (ONETOUCH ULTRASOFT) lancets USE   TO CHECK GLUCOSE ONCE DAILY 100 each 0   lisinopril (ZESTRIL) 10 MG tablet Take 2 tablets by mouth once daily 180 tablet 0   metFORMIN (GLUCOPHAGE) 500 MG tablet Take 1 tablet (500 mg total) by mouth 2 (two) times daily with a meal. 180 tablet 3   metoprolol succinate (TOPROL-XL) 50 MG 24 hr tablet TAKE ONE TABLET BY MOUTH WITH OR IMMEDIATELY FOLLOWING A MEAL 90 tablet 0   ONETOUCH VERIO test strip USE 1 STRIP TO CHECK GLUCOSE ONCE DAILY 50 each 0   risedronate (ACTONEL) 5 MG tablet Take 5 mg by mouth every morning.      rosuvastatin (CRESTOR) 40 MG tablet Take 1 tablet (40 mg total) by mouth daily. 90 tablet 3   spironolactone (ALDACTONE) 25 MG tablet Take 1 tablet by mouth once daily 90 tablet 0   No current facility-administered medications for this visit.    Review of Systems  Constitutional: positive for fatigue Eyes: negative Ears, nose, mouth, throat, and face: negative Respiratory: positive for cough and dyspnea on exertion Cardiovascular: negative Gastrointestinal: negative Genitourinary:negative Integument/breast: negative Hematologic/lymphatic: negative Musculoskeletal:positive for arthralgias Neurological: negative Behavioral/Psych: negative Endocrine: negative Allergic/Immunologic: negative  Physical Exam  LKT:GYBWL, healthy, no distress, well nourished, and well developed SKIN: skin color, texture, turgor are normal, no rashes or significant lesions HEAD: Normocephalic, No masses, lesions, tenderness or abnormalities EYES: normal, PERRLA, Conjunctiva are pink and non-injected EARS: External ears normal, Canals clear OROPHARYNX:no exudate, no erythema, and lips, buccal mucosa, and tongue normal  NECK: supple, no adenopathy, no JVD LYMPH:  no palpable lymphadenopathy, no hepatosplenomegaly BREAST:not examined LUNGS: clear to auscultation , and palpation HEART: regular rate & rhythm, and no gallops ABDOMEN:abdomen soft, non-tender, obese, normal bowel sounds, and no masses or organomegaly BACK: Back symmetric, no curvature., No CVA tenderness EXTREMITIES:no joint deformities, effusion, or inflammation, no edema  NEURO: alert & oriented x 3 with fluent speech, no focal motor/sensory deficits  PERFORMANCE STATUS: ECOG 1  LABORATORY DATA: Lab Results  Component Value Date   WBC 8.7 08/16/2022   HGB 12.2 08/16/2022   HCT 39.1 08/16/2022   MCV 98.0 08/16/2022   PLT 245 08/16/2022      Chemistry      Component Value Date/Time   NA 139 08/16/2022 0616   K 4.6 08/16/2022  0616   CL 107 08/16/2022 0616   CO2 20 (L) 08/16/2022 0616   BUN 47 (H) 08/16/2022 0616   CREATININE 1.82 (H) 08/16/2022 0616   CREATININE 0.90 11/23/2015 1056      Component Value Date/Time   CALCIUM 10.0 08/16/2022 0616   CALCIUM 9.3 05/21/2022 0725   ALKPHOS 56 05/21/2022 0725   AST 35 05/21/2022 0725   ALT 55 (H) 05/21/2022 0725   BILITOT 0.8 05/21/2022 0725       RADIOGRAPHIC STUDIES: MR BRAIN WO CONTRAST  Result Date: 09/02/2022 CLINICAL DATA:  Hematologic  malignancy, staging EXAM: MRI HEAD WITHOUT CONTRAST TECHNIQUE: Multiplanar, multiecho pulse sequences of the brain and surrounding structures were obtained without intravenous contrast. COMPARISON:  MRI head 06/27/2019. FINDINGS: Brain: Remote right PCA territory infarct. Additional remote lacunar infarcts and patchy T2/FLAIR hyperintensity in the white matter, compatible with chronic microvascular ischemic disease. No evidence of acute infarct, acute hemorrhage, mass lesion, midline shift or hydrocephalus. No midline shift. The absence of contrast precludes evaluation for enhancement. Vascular: Major arterial flow voids are maintained at the skull base. Skull and upper cervical spine: Normal marrow signal. Sinuses/Orbits: Clear sinuses.  No acute orbital findings. Other: Small bilateral mastoid effusions. IMPRESSION: 1. No evidence of acute intracranial abnormality. 2. Chronic microvascular ischemic disease. Electronically Signed   By: Margaretha Sheffield M.D.   On: 09/02/2022 15:09   DG Chest Port 1 View  Result Date: 08/16/2022 CLINICAL DATA:  [id number] EXAM: PORTABLE CHEST 1 VIEW COMPARISON:  June 19, 2022 FINDINGS: Mildly enlarged cardiac silhouette. Calcific atherosclerotic disease and tortuosity of the aorta. Cavitary lesion in the right upper lobe of the lung is stable radiographically. No pneumothorax. IMPRESSION: 1. Stable cavitary lesion in the right upper lobe of the lung. 2. No pneumothorax. Electronically Signed   By:  Fidela Salisbury M.D.   On: 08/16/2022 11:00   DG C-ARM BRONCHOSCOPY  Result Date: 08/16/2022 C-ARM BRONCHOSCOPY: Fluoroscopy was utilized by the requesting physician.  No radiographic interpretation.   NM PET Image Initial (PI) Skull Base To Thigh  Result Date: 08/15/2022 CLINICAL DATA:  Initial treatment strategy for new diagnosis of non-small-cell lung cancer. EXAM: NUCLEAR MEDICINE PET SKULL BASE TO THIGH TECHNIQUE: 10.8 mCi F-18 FDG was injected intravenously. Full-ring PET imaging was performed from the skull base to thigh after the radiotracer. CT data was obtained and used for attenuation correction and anatomic localization. Fasting blood glucose: 103 mg/dl COMPARISON:  Chest CTs including 07/26/2022. Abdominopelvic CT of 05/20/2022. FINDINGS: Mediastinal blood pool activity: SUV max 2.7 Liver activity: SUV max NA NECK: No areas of abnormal hypermetabolism. Incidental CT findings: Bilateral carotid atherosclerosis. No cervical adenopathy. Fluid in the left maxillary sinus. CHEST: A right paratracheal node measures 9 mm and a S.U.V. max of 3.1 on 61/4. Similar in size back to 05/20/2022. Posterior right upper lobe 4.0 x 3.6 cm irregular cavitary lesion demonstrates hypermetabolism within its solid portion, including at a S.U.V. max of 24.8 on 58/4. Incidental CT findings: Deferred to recent diagnostic CT. Aortic and coronary artery calcification. Mild cardiomegaly. Tiny hiatal hernia. The previously described right upper lobe pulmonary nodule is not well delineated today and is either contiguous or adjacent to the dominant mass. ABDOMEN/PELVIS: Hypermetabolism corresponding to low density left adrenal thickening and nodularity. 2.5 x 2.3 cm and a S.U.V. max of 3.9 on 101/4. No abdominopelvic nodal hypermetabolism. Incidental CT findings: Right adrenal thickening. Abdominal aortic atherosclerosis. Normal noncontrast appearance of the liver, pancreas, spleen, kidneys, uterus. Extensive colonic  diverticulosis. SKELETON: Right-sided hypermetabolism about the L3-4 junction is favored to be degenerative at a S.U.V. max of 6.5. Hypermetabolism corresponding to a fifth anterior right rib healing fracture on 74/4. This is new since 05/20/2022 but was present on 07/26/2022. Incidental CT findings: None. IMPRESSION: 1. 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 2. Upper normal sized right paratracheal node with low-level, indeterminate hypermetabolism. 3. Otherwise, no evidence of tracer avid metastasis. 4. Left adrenal adenoma with low-level hypermetabolism. In the absence of clinically indicated signs/symptoms require(s) no independent follow-up. 5. Incidental findings, including: Coronary artery  atherosclerosis. Aortic Atherosclerosis (ICD10-I70.0). Tiny hiatal hernia. Sinus disease. Electronically Signed   By: Abigail Miyamoto M.D.   On: 08/15/2022 14:26    ASSESSMENT: This is a very pleasant 76 years old African-American female diagnosed with a stage IIa (T2b, N0, M0) non-small cell lung cancer, squamous cell carcinoma presented with cavitary right upper lobe lung mass diagnosed in September 2023.   PLAN: I had a lengthy discussion with the patient and her son today about her current disease stage, prognosis and treatment options. I personally and independently reviewed the scan images and discussed the result and showed the images to the patient today. I explained to the patient that the best treatment option for her condition would be surgical resection if she is an operable candidate for the surgery. She is supposed to see Dr. Kipp Brood on September 14, 2022 to discuss the surgical option. If the patient is not a good surgical candidate, she may benefit from curative radiotherapy and I will arrange for her to see radiation oncology for evaluation of her condition if surgery is not an option. I will continue to monitor the patient closely after her treatment  either with surgery or radiation with repeat CT scan of the chest in 4 months. If she undergoes surgical resection, I will see her sooner for discussion of any adjuvant chemotherapy if needed. She was advised to call immediately if she has any other concerning symptoms in the interval. The patient voices understanding of current disease status and treatment options and is in agreement with the current care plan.  All questions were answered. The patient knows to call the clinic with any problems, questions or concerns. We can certainly see the patient much sooner if necessary.  Thank you so much for allowing me to participate in the care of [name]. I will continue to follow up the patient with you and assist in her care. The total time spent in the appointment was 60 minutes.  Disclaimer: This note was dictated with voice recognition software. Similar sounding words can inadvertently be transcribed and may not be corrected upon review.   Eilleen Kempf September 11, 2022, 1:36 PM

## 2022-09-13 ENCOUNTER — Telehealth: Payer: Self-pay | Admitting: Medical Oncology

## 2022-09-13 NOTE — Telephone Encounter (Signed)
CV[facility] appt-Pt cancelled appt.  LVM for dtr that pt cancelled her appt tomorrow with Dr Kipp Brood for surgical evaluation .   Pt told someone that she cancelled it because she "was starting chemotherapy" . That is not accurate at this time.  She  needs to see Dr Kipp Brood first .   I lvm with pts daughter to reschedule her appt  with Dr. Kipp Brood.  If he offers her surgery then she will not need her appt next week with radiation.

## 2022-09-14 ENCOUNTER — Encounter: Payer: Medicare Other | Admitting: Thoracic Surgery (Cardiothoracic Vascular Surgery)

## 2022-09-20 ENCOUNTER — Ambulatory Visit: Payer: Medicare Other

## 2022-09-20 ENCOUNTER — Encounter: Payer: Self-pay | Admitting: Medical Oncology

## 2022-09-20 ENCOUNTER — Ambulatory Visit: Payer: Medicare Other | Admitting: Radiation Oncology

## 2022-09-24 ENCOUNTER — Other Ambulatory Visit: Payer: Self-pay | Admitting: Family Medicine

## 2022-09-24 NOTE — Progress Notes (Signed)
[address]       [place] [address]             [phone]                    [facility] Record #[id number] Date of Birth: 1945-12-27  Referring: Maryjane Hurter, MD Primary Care: Laurey Morale, MD Primary Cardiologist: None  Chief Complaint:    Chief Complaint  Patient presents with   Lung Lesion    Review work-up    History of Present Illness:    [name] 76 y.o. female presents for surgical evaluation of a biopsy-proven right upper lobe squamous cell carcinoma.  She is a former smoker and has a history of a cerebrovascular event with residual right-sided weakness, and some mild expressive aphasia.  The lesion was originally identified in June of this year when she was admitted with concerns for an aspiration pneumonia.  After being treated with IV month-long course of antibiotics the lesion persisted.  She subsequently underwent navigational bronchoscopy with biopsy which diagnosed her cancer.     Zubrod Score: At the time of surgery this patient's most appropriate activity status/level should be described as: _0     0    Normal activity, no symptoms _1     1    Restricted in physical strenuous activity but ambulatory, able to do out light work _2     2    Ambulatory and capable of self care, unable to do work activities, up and about               >50 % of waking hours                              _3     3    Only limited self care, in bed greater than 50% of waking hours _4     4    Completely disabled, no self care, confined to bed or chair _5     5    Moribund   Past Medical History:  Diagnosis Date   Acute bronchitis 07/22/2008   Arthritis    Cataract    Diabetes mellitus without complication ([facility]) 65/78/4696   Type II   Heart murmur    History of CVA (cerebrovascular accident)    HYPERLIPIDEMIA, MIXED 07/18/2007   Hypertension    Stroke ([facility]) 06/26/2019   weakness R side    Past Surgical History:   Procedure Laterality Date   BRONCHIAL BIOPSY  08/16/2022   Procedure: BRONCHIAL BIOPSIES;  Surgeon: Maryjane Hurter, MD;  Location: [facility];  Service: Pulmonary;;   BRONCHIAL BRUSHINGS  08/16/2022   Procedure: BRONCHIAL BRUSHINGS;  Surgeon: Maryjane Hurter, MD;  Location: [facility];  Service: Pulmonary;;   BRONCHIAL NEEDLE ASPIRATION BIOPSY  08/16/2022   Procedure: BRONCHIAL NEEDLE ASPIRATION BIOPSIES;  Surgeon: Maryjane Hurter, MD;  Location: [facility];  Service: Pulmonary;;   ENDOBRONCHIAL ULTRASOUND  08/16/2022   Procedure: ENDOBRONCHIAL ULTRASOUND;  Surgeon: Maryjane Hurter, MD;  Location[facility] ENDOSCOPY;  Service: Pulmonary;;   EYE SURGERY Right 11/2018   implant   JOINT REPLACEMENT Bilateral    Total Knee Dr Noemi Chapel   NECK SURGERY     Remove a tumor   TYMPANOSTOMY     Dr. Thornell Mule   VIDEO BRONCHOSCOPY WITH RADIAL ENDOBRONCHIAL ULTRASOUND  08/16/2022   Procedure: VIDEO BRONCHOSCOPY WITH RADIAL  ENDOBRONCHIAL ULTRASOUND;  Surgeon: Maryjane Hurter, MD;  Location: [facility] ENDOSCOPY;  Service: Pulmonary;;    Family History  Problem Relation Age of Onset   Heart disease Father        No details   Heart failure Mother    Diabetes Neg Hx    Colon cancer Neg Hx    Esophageal cancer Neg Hx    Rectal cancer Neg Hx    Stomach cancer Neg Hx      Social History   Tobacco Use  Smoking Status Former   Packs/day: 0.50   Years: 53.00   Total pack years: 26.50   Types: Cigarettes   Start date: 11/26/1968  Smokeless Tobacco Never  Tobacco Comments   trying to quit;  not really motivated at present    Social History   Substance and Sexual Activity  Alcohol Use Not Currently   Alcohol/week: 0.0 standard drinks of alcohol   Comment: rare     Allergies  Allergen Reactions   Actonel [Risedronate] Other (See Comments)    It made her "feel like I'm going backwards"    Hydromet [Hydrocodone Bit-Homatrop Mbr] Itching    Current Outpatient Medications  Medication Sig  Dispense Refill   albuterol (VENTOLIN HFA) 108 (90 Base) MCG/ACT inhaler Inhale 2 puffs into the lungs every 6 (six) hours as needed for wheezing or shortness of breath. 8 g 2   Alcohol Swabs (ALCOHOL PREP) PADS Use to test 1 X day and diagnosis code is E 11.9 100 each 1   amLODipine (NORVASC) 5 MG tablet Take 1 tablet by mouth once daily 90 tablet 0   aspirin 81 MG chewable tablet Chew 1 tablet (81 mg total) by mouth daily. 30 tablet 3   Blood Glucose Monitoring Suppl (ONETOUCH VERIO) w/Device KIT Use to check blood sugar two times daily 1 kit 0   diclofenac (VOLTAREN) 75 MG EC tablet Take 1 tablet (75 mg total) by mouth 2 (two) times daily. 180 tablet 3   furosemide (LASIX) 20 MG tablet Take 1 tablet by mouth once daily 90 tablet 0   glipiZIDE (GLUCOTROL) 10 MG tablet Take 1 tablet (10 mg total) by mouth 2 (two) times daily before a meal. 180 tablet 1   Lancets (ONETOUCH ULTRASOFT) lancets USE   TO CHECK GLUCOSE ONCE DAILY 100 each 0   lisinopril (ZESTRIL) 10 MG tablet Take 2 tablets by mouth once daily 180 tablet 0   metFORMIN (GLUCOPHAGE) 500 MG tablet Take 1 tablet (500 mg total) by mouth 2 (two) times daily with a meal. 180 tablet 3   metoprolol succinate (TOPROL-XL) 50 MG 24 hr tablet TAKE ONE TABLET BY MOUTH WITH OR IMMEDIATELY FOLLOWING A MEAL 90 tablet 0   ONETOUCH VERIO test strip USE 1 STRIP TO CHECK GLUCOSE ONCE DAILY 50 each 0   risedronate (ACTONEL) 5 MG tablet Take 5 mg by mouth every morning.     rosuvastatin (CRESTOR) 40 MG tablet Take 1 tablet (40 mg total) by mouth daily. 90 tablet 3   spironolactone (ALDACTONE) 25 MG tablet Take 1 tablet by mouth once daily 90 tablet 0   JARDIANCE 25 MG TABS tablet TAKE 1 TABLET BY MOUTH ONCE DAILY BEFORE BREAKFAST 90 tablet 0   No current facility-administered medications for this visit.    Review of Systems  Constitutional: Negative.   Respiratory: Negative.    Cardiovascular: Negative.   Neurological: Negative.      PHYSICAL  EXAMINATION: BP (!) 146/68 (BP Location: Left  Arm, Patient Position: Sitting)   Pulse 76   Resp 18   Ht _0  (1.626 m)   Wt 214 lb (97.1 kg)   SpO2 92% Comment: RA  BMI 36.73 kg/m  Physical Exam Constitutional:      General: She is not in acute distress.    Appearance: She is obese. She is not ill-appearing.  Cardiovascular:     Rate and Rhythm: Normal rate.  Pulmonary:     Effort: Pulmonary effort is normal. No respiratory distress.  Musculoskeletal:     Cervical back: Normal range of motion.     Comments: Presents in a wheelchair.  Neurological:     General: No focal deficit present.     Mental Status: She is alert and oriented to person, place, and time.     Diagnostic Studies & Laboratory data:     Recent Radiology Findings:   MR BRAIN WO CONTRAST  Result Date: 09/02/2022 CLINICAL DATA:  Hematologic malignancy, staging EXAM: MRI HEAD WITHOUT CONTRAST TECHNIQUE: Multiplanar, multiecho pulse sequences of the brain and surrounding structures were obtained without intravenous contrast. COMPARISON:  MRI head 06/27/2019. FINDINGS: Brain: Remote right PCA territory infarct. Additional remote lacunar infarcts and patchy T2/FLAIR hyperintensity in the white matter, compatible with chronic microvascular ischemic disease. No evidence of acute infarct, acute hemorrhage, mass lesion, midline shift or hydrocephalus. No midline shift. The absence of contrast precludes evaluation for enhancement. Vascular: Major arterial flow voids are maintained at the skull base. Skull and upper cervical spine: Normal marrow signal. Sinuses/Orbits: Clear sinuses.  No acute orbital findings. Other: Small bilateral mastoid effusions. IMPRESSION: 1. No evidence of acute intracranial abnormality. 2. Chronic microvascular ischemic disease. Electronically Signed   By: Margaretha Sheffield M.D.   On: 09/02/2022 15:09       I have independently reviewed the above radiology studies  and reviewed the findings with the  patient.   Recent Lab Findings: Lab Results  Component Value Date   WBC 6.5 09/11/2022   HGB 11.4 (L) 09/11/2022   HCT 35.1 (L) 09/11/2022   PLT 324 09/11/2022   GLUCOSE 202 (H) 09/11/2022   CHOL 187 02/06/2022   TRIG 139.0 02/06/2022   HDL 66.20 02/06/2022   LDLDIRECT 231.0 07/23/2016   LDLCALC 93 02/06/2022   ALT 35 09/11/2022   AST 23 09/11/2022   NA 138 09/11/2022   K 4.9 09/11/2022   CL 107 09/11/2022   CREATININE 2.24 (H) 09/11/2022   BUN 52 (H) 09/11/2022   CO2 22 09/11/2022   TSH 2.03 02/06/2022   INR 1.2 05/20/2022   HGBA1C 8.3 (H) 05/14/2022     PFTs:  Pending  Problem List:  FINAL MICROSCOPIC DIAGNOSIS:  D. LYMPH NODE, 7, FINE NEEDLE ASPIRATION:  - No malignant cells identified  - Scant cellularity; compatible with benign lymph node   E. LYMPH NODE, 4R, FINE NEEDLE ASPIRATION:  - No malignant cells identified  - Consistent with a benign lymph node   F. LYMPH NODE, 11R, FINE NEEDLE ASPIRATION:  - No malignant cells identified  - Negative for lymph node  - Predominantly blood with admixed benign bronchial epithelium    FINAL MICROSCOPIC DIAGNOSIS:  A.  RIGHT LUNG, UPPER LOBE, CAVITARY LESION, FINE NEEDLE ASPIRATION:  - Malignant  - Poorly differentiated squamous carcinoma   B. RIGHT LUNG, UPPER LOBE, CAVITARY LESION, BRUSHING:  - No malignant cells identified  - Scant cellularity; scattered benign bronchial cells   IMPRESSION: 1. Right upper lobe hypermetabolic cavitary lung mass, likely  squamous cell carcinoma. 2. Upper normal sized right paratracheal node with low-level, indeterminate hypermetabolism. 3. Otherwise, no evidence of tracer avid metastasis. 4. Left adrenal adenoma with low-level hypermetabolism. In the absence of clinically indicated signs/symptoms require(s) no independent follow-up. 5. Incidental findings, including: Coronary artery atherosclerosis. Aortic Atherosclerosis (ICD10-I70.0). Tiny hiatal hernia.  Sinus disease.    Assessment / Plan:   76yo female with biopsy proven right upper lobe squamous cell carcinoma.  Hx of CVA with residual weakness.  She and her family member state that she is ambulatory at home, but uses a wheelchair for long distances.  She states that she would like to undergo surgical resection.  Taking Rosanne Gutting function testing as well as stress test to further assess her surgical candidacy.  She is quite frail and I explained to her that she will require assistance after the surgery if it comes to that.      I  spent 40 minutes with  the patient face to face in counseling and coordination of care.    Lajuana Matte 09/28/2022 10:30 AM

## 2022-09-26 ENCOUNTER — Institutional Professional Consult (permissible substitution) (INDEPENDENT_AMBULATORY_CARE_PROVIDER_SITE_OTHER): Payer: Medicare Other | Admitting: Thoracic Surgery (Cardiothoracic Vascular Surgery)

## 2022-09-26 ENCOUNTER — Other Ambulatory Visit: Payer: Self-pay | Admitting: *Deleted

## 2022-09-26 ENCOUNTER — Other Ambulatory Visit: Payer: Self-pay | Admitting: Thoracic Surgery (Cardiothoracic Vascular Surgery)

## 2022-09-26 VITALS — BP 146/68 | HR 76 | Resp 18 | Ht 64.0 in | Wt 214.0 lb

## 2022-09-26 DIAGNOSIS — C3411 Malignant neoplasm of upper lobe, right bronchus or lung: Secondary | ICD-10-CM

## 2022-09-27 ENCOUNTER — Other Ambulatory Visit: Payer: Self-pay | Admitting: Thoracic Surgery (Cardiothoracic Vascular Surgery)

## 2022-09-27 ENCOUNTER — Encounter: Payer: Self-pay | Admitting: *Deleted

## 2022-09-27 ENCOUNTER — Other Ambulatory Visit: Payer: Self-pay | Admitting: Family Medicine

## 2022-09-27 ENCOUNTER — Other Ambulatory Visit: Payer: Self-pay | Admitting: *Deleted

## 2022-09-27 DIAGNOSIS — C3411 Malignant neoplasm of upper lobe, right bronchus or lung: Secondary | ICD-10-CM

## 2022-09-27 DIAGNOSIS — E1140 Type 2 diabetes mellitus with diabetic neuropathy, unspecified: Secondary | ICD-10-CM

## 2022-09-27 NOTE — Progress Notes (Deleted)
Name: [name] DOB: March 14, 1946 MRN: [id number]    The following appointments have been scheduled in preparation for your surgery. There are no special instructions or fasting for these appointments.    Pulmonary Function Testing- [facility], 2nd floor,[phone]  Date: Friday 11/24 at 9am  2. Pre-Admission-[facility] Short Stay Department, 2nd floor, [phone]  Date: Friday 11/24 at 11am  (COVID swab to be done at this appt)  ______________________________________________________________________   Your surgery is scheduled for: Date: Monday 11/27  (Your arrival time is scheduled for: 5:30am)       Thanks, Levonne Spiller, RN [phone]

## 2022-09-30 LAB — ACID FAST CULTURE WITH REFLEXED SENSITIVITIES (MYCOBACTERIA): Acid Fast Culture: NEGATIVE

## 2022-10-02 ENCOUNTER — Telehealth: Payer: Self-pay | Admitting: Radiation Oncology

## 2022-10-02 ENCOUNTER — Encounter: Payer: Self-pay | Admitting: Radiation Oncology

## 2022-10-02 NOTE — Telephone Encounter (Signed)
Patient does not need radiation at this time, patient is going to have surgery - per Theressa Millard. Closing referral until further notice.

## 2022-10-02 NOTE — Progress Notes (Signed)
Pt met with Dr. Kipp Brood and has plans to move forward with surgery for her lung cancer. We'd be happy to see her back as needed in the future.

## 2022-10-04 ENCOUNTER — Telehealth (HOSPITAL_COMMUNITY): Payer: Self-pay

## 2022-10-04 ENCOUNTER — Encounter (HOSPITAL_COMMUNITY): Payer: Self-pay | Admitting: Thoracic Surgery (Cardiothoracic Vascular Surgery)

## 2022-10-04 NOTE — Telephone Encounter (Signed)
Attempted to contact the patient, the line was busy. Will try again. S.Kohner Orlick EMTP

## 2022-10-09 ENCOUNTER — Ambulatory Visit (HOSPITAL_COMMUNITY): Payer: Medicare Other

## 2022-10-09 ENCOUNTER — Other Ambulatory Visit: Payer: Self-pay | Admitting: *Deleted

## 2022-10-09 ENCOUNTER — Encounter (HOSPITAL_COMMUNITY): Payer: Self-pay

## 2022-10-12 ENCOUNTER — Ambulatory Visit: Payer: Medicare Other | Admitting: Cardiology

## 2022-10-16 ENCOUNTER — Other Ambulatory Visit: Payer: Self-pay

## 2022-10-16 ENCOUNTER — Inpatient Hospital Stay: Payer: Medicare Other | Attending: Internal Medicine | Admitting: Internal Medicine

## 2022-10-16 VITALS — BP 152/76 | HR 62 | Temp 98.2°F | Resp 18 | Ht 64.0 in | Wt 215.1 lb

## 2022-10-16 DIAGNOSIS — C3490 Malignant neoplasm of unspecified part of unspecified bronchus or lung: Secondary | ICD-10-CM | POA: Diagnosis not present

## 2022-10-16 DIAGNOSIS — C3411 Malignant neoplasm of upper lobe, right bronchus or lung: Secondary | ICD-10-CM | POA: Diagnosis not present

## 2022-10-16 NOTE — Progress Notes (Signed)
[facility] Telephone:([phone]   [phone]  OFFICE PROGRESS NOTE  Laurey Morale, MD [place] [address]  DIAGNOSIS:  Stage IIa (T2b, N0, M0) non-small cell lung cancer, squamous cell carcinoma presented with cavitary right upper lobe lung mass diagnosed in September 2023.   PRIOR THERAPY: None  CURRENT THERAPY: None but expected to have SBRT to the cavitary right upper lobe lung nodule soon under the care of Dr. Lisbeth Renshaw  INTERVAL HISTORY: [name] 76 y.o. female returns to the clinic today for follow-up visit accompanied by her son and daughter.  The patient is feeling fine today with no concerning complaints except for pain on the left shoulder area.  She denied having any current chest pain, shortness of breath, cough or hemoptysis.  She has no nausea, vomiting, diarrhea or constipation.  She has no headache or visual changes.  He was seen by Dr. Kipp Brood for consideration of surgical resection but after discussion of the surgery the patient was reluctant to consider proceeding with surgical resection and she would like to discuss other option for treatment of her condition.  MEDICAL HISTORY: Past Medical History:  Diagnosis Date   Acute bronchitis 07/22/2008   Arthritis    Cataract    Diabetes mellitus without complication ([facility]) 57/47/3403   Type II   Heart murmur    History of CVA (cerebrovascular accident)    HYPERLIPIDEMIA, MIXED 07/18/2007   Hypertension    Stroke ([facility]) 06/26/2019   weakness R side    ALLERGIES:  is allergic to actonel [risedronate] and hydromet [hydrocodone bit-homatrop mbr].  MEDICATIONS:  Current Outpatient Medications  Medication Sig Dispense Refill   albuterol (VENTOLIN HFA) 108 (90 Base) MCG/ACT inhaler Inhale 2 puffs into the lungs every 6 (six) hours as needed for wheezing or shortness of breath. 8 g 2   Alcohol Swabs (ALCOHOL PREP) PADS Use to test 1 X day and diagnosis code is  E 11.9 100 each 1   amLODipine (NORVASC) 5 MG tablet Take 1 tablet by mouth once daily 90 tablet 0   aspirin 81 MG chewable tablet Chew 1 tablet (81 mg total) by mouth daily. 30 tablet 3   Blood Glucose Monitoring Suppl (ONETOUCH VERIO) w/Device KIT Use to check blood sugar two times daily 1 kit 0   diclofenac (VOLTAREN) 75 MG EC tablet Take 1 tablet (75 mg total) by mouth 2 (two) times daily. 180 tablet 3   furosemide (LASIX) 20 MG tablet Take 1 tablet by mouth once daily 90 tablet 0   glipiZIDE (GLUCOTROL) 10 MG tablet Take 1 tablet (10 mg total) by mouth 2 (two) times daily before a meal. 180 tablet 1   JARDIANCE 25 MG TABS tablet TAKE 1 TABLET BY MOUTH ONCE DAILY BEFORE BREAKFAST 90 tablet 0   Lancets (ONETOUCH ULTRASOFT) lancets USE   TO CHECK GLUCOSE ONCE DAILY 100 each 0   lisinopril (ZESTRIL) 10 MG tablet Take 2 tablets by mouth once daily 180 tablet 0   metFORMIN (GLUCOPHAGE) 500 MG tablet Take 1 tablet (500 mg total) by mouth 2 (two) times daily with a meal. 180 tablet 3   metoprolol succinate (TOPROL-XL) 50 MG 24 hr tablet TAKE ONE TABLET BY MOUTH WITH OR IMMEDIATELY FOLLOWING A MEAL 90 tablet 0   ONETOUCH VERIO test strip USE 1 STRIP TO CHECK GLUCOSE ONCE DAILY 50 each 0   risedronate (ACTONEL) 5 MG tablet Take 5 mg by mouth every  morning.     rosuvastatin (CRESTOR) 40 MG tablet Take 1 tablet (40 mg total) by mouth daily. 90 tablet 3   spironolactone (ALDACTONE) 25 MG tablet Take 1 tablet by mouth once daily 90 tablet 0   No current facility-administered medications for this visit.    SURGICAL HISTORY:  Past Surgical History:  Procedure Laterality Date   BRONCHIAL BIOPSY  08/16/2022   Procedure: BRONCHIAL BIOPSIES;  Surgeon: Maryjane Hurter, MD;  Location: [facility];  Service: Pulmonary;;   BRONCHIAL BRUSHINGS  08/16/2022   Procedure: BRONCHIAL BRUSHINGS;  Surgeon: Maryjane Hurter, MD;  Location: [facility];  Service: Pulmonary;;   BRONCHIAL NEEDLE ASPIRATION BIOPSY   08/16/2022   Procedure: BRONCHIAL NEEDLE ASPIRATION BIOPSIES;  Surgeon: Maryjane Hurter, MD;  Location: [facility];  Service: Pulmonary;;   ENDOBRONCHIAL ULTRASOUND  08/16/2022   Procedure: ENDOBRONCHIAL ULTRASOUND;  Surgeon: Maryjane Hurter, MD;  Location: [facility] ENDOSCOPY;  Service: Pulmonary;;   EYE SURGERY Right 11/2018   implant   JOINT REPLACEMENT Bilateral    Total Knee Dr Noemi Chapel   NECK SURGERY     Remove a tumor   TYMPANOSTOMY     Dr. Thornell Mule   VIDEO BRONCHOSCOPY WITH RADIAL ENDOBRONCHIAL ULTRASOUND  08/16/2022   Procedure: VIDEO BRONCHOSCOPY WITH RADIAL ENDOBRONCHIAL ULTRASOUND;  Surgeon: Maryjane Hurter, MD;  Location: [facility] ENDOSCOPY;  Service: Pulmonary;;    REVIEW OF SYSTEMS:  Constitutional: positive for fatigue Eyes: negative Ears, nose, mouth, throat, and face: negative Respiratory: negative Cardiovascular: negative Gastrointestinal: negative Genitourinary:negative Integument/breast: negative Hematologic/lymphatic: negative Musculoskeletal:positive for arthralgias Neurological: negative Behavioral/Psych: negative Endocrine: negative Allergic/Immunologic: negative   PHYSICAL EXAMINATION: General appearance: alert, cooperative, appears stated age, fatigued, and no distress Head: Normocephalic, without obvious abnormality, atraumatic Neck: no adenopathy, no JVD, supple, symmetrical, trachea midline, and thyroid not enlarged, symmetric, no tenderness/mass/nodules Lymph nodes: Cervical, supraclavicular, and axillary nodes normal. Resp: clear to auscultation bilaterally Back: symmetric, no curvature. ROM normal. No CVA tenderness. Cardio: regular rate and rhythm, S1, S2 normal, no murmur, click, rub or gallop GI: soft, non-tender; bowel sounds normal; no masses,  no organomegaly Extremities: extremities normal, atraumatic, no cyanosis or edema Neurologic: Alert and oriented X 3, normal strength and tone. Normal symmetric reflexes. Normal coordination and gait  ECOG  PERFORMANCE STATUS: 1 - Symptomatic but completely ambulatory  Blood pressure (!) 152/76, pulse 62, temperature 98.2 F (36.8 C), temperature source Oral, resp. rate 18, height _0  (1.626 m), weight 215 lb 1.6 oz (97.6 kg), SpO2 99 %.  LABORATORY DATA: Lab Results  Component Value Date   WBC 6.5 09/11/2022   HGB 11.4 (L) 09/11/2022   HCT 35.1 (L) 09/11/2022   MCV 94.9 09/11/2022   PLT 324 09/11/2022      Chemistry      Component Value Date/Time   NA 138 09/11/2022 1343   K 4.9 09/11/2022 1343   CL 107 09/11/2022 1343   CO2 22 09/11/2022 1343   BUN 52 (H) 09/11/2022 1343   CREATININE 2.24 (H) 09/11/2022 1343   CREATININE 0.90 11/23/2015 1056      Component Value Date/Time   CALCIUM 9.9 09/11/2022 1343   CALCIUM 9.3 05/21/2022 0725   ALKPHOS 64 09/11/2022 1343   AST 23 09/11/2022 1343   ALT 35 09/11/2022 1343   BILITOT 0.3 09/11/2022 1343       RADIOGRAPHIC STUDIES: No results found.  ASSESSMENT AND PLAN: This is a very pleasant 76 years old African-American female with a stage IIa (T2b, N0, M0)  non-small cell lung cancer, squamous cell carcinoma presented with cavitary right upper lobe lung mass diagnosed in September 2023.  She has no actionable mutations and PD-L1 expression was 5%. The patient was evaluated by Dr. Kipp Brood for consideration of surgical resection but after the discussion with him she is very reluctant and consideration of surgery and she is here today for reevaluation and discussion of other treatment options. I had a lengthy discussion with the patient and her family about her current condition and possible treatment options. I explained to the patient that definitely surgical resection would have been the best option for her but her next best option would be curative radiotherapy with SBRT under the care of Dr. Lisbeth Renshaw. I will refer the patient back to Dr. Lisbeth Renshaw for evaluation and discussion of this option. I will see her back for follow-up visit in 4  months for evaluation and repeat CT scan of the chest for restaging of her disease and close monitoring of her condition after the radiotherapy. The patient was advised to call immediately if she has any other concerning symptoms in the interval. The patient voices understanding of current disease status and treatment options and is in agreement with the current care plan.  All questions were answered. The patient knows to call the clinic with any problems, questions or concerns. We can certainly see the patient much sooner if necessary.  The total time spent in the appointment was 30 minutes.  Disclaimer: This note was dictated with voice recognition software. Similar sounding words can inadvertently be transcribed and may not be corrected upon review.

## 2022-10-19 ENCOUNTER — Other Ambulatory Visit (HOSPITAL_COMMUNITY): Payer: Medicare Other

## 2022-10-19 ENCOUNTER — Encounter (HOSPITAL_COMMUNITY): Payer: Medicare Other

## 2022-10-21 ENCOUNTER — Other Ambulatory Visit: Payer: Self-pay | Admitting: Family Medicine

## 2022-10-21 DIAGNOSIS — E1140 Type 2 diabetes mellitus with diabetic neuropathy, unspecified: Secondary | ICD-10-CM

## 2022-10-22 ENCOUNTER — Inpatient Hospital Stay (HOSPITAL_COMMUNITY): Admit: 2022-10-22 | Payer: Medicare Other | Admitting: Thoracic Surgery (Cardiothoracic Vascular Surgery)

## 2022-10-22 ENCOUNTER — Encounter (HOSPITAL_COMMUNITY): Payer: Self-pay

## 2022-10-22 SURGERY — LOBECTOMY, LUNG, ROBOT-ASSISTED, USING VATS
Anesthesia: General | Site: Chest | Laterality: Right

## 2022-10-22 NOTE — Progress Notes (Signed)
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   HISTORY OF PRESENT ILLNESS: [name] is a 76 y.o. female with a diagnosis this of non-small cell lung cancer.  The patient had been admitted in the summer 2023 with sepsis and a CT chest abdomen and pelvis during her work-up without contrast showed a thick walled cavitary lung mass within the posterior right upper lobe no evidence of nodal or distant metastatic disease was identified, and she had follow-up with a CT scan without contrast on 07/26/2022 persisting was an irregular thick-walled cavitary lesion in the right upper lobe measuring up to 4.1 cm there was central nodular component measuring 1.1 cm and a subpleural nodule in the right upper lobe unchanged from prior.  No new nodule or appearance of new disease was present however she did have a prominent lymph node in the mediastinum in the right paratracheal space measuring 1 cm.  The remainder was limited without contrast.  She underwent a PET scan on 08/15/2022 which showed hypermetabolic activity in the right upper lobe mass with an SUV of 24.8.  Her right paratracheal lymph node measured 9 mm with an SUV of 3.1.  Hypermetabolic activity was also seen in the left adrenal gland with an SUV of 3.9 and a nodule measuring 2.5 cm.  She did undergo bronchoscopy with navigation on 08/16/2022.  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   She was seen in our clinic but was also considering surgical options.  Initially it was felt that she would consider surgical resection and met with [name] but then after discussion is not motivated for surgical procedure.  She is seen with known to discuss stereotactic body radiotherapy. Of note since her last visit she did also undergo an MRI of the brain without contrast on 08/30/2022 that showed no evidence of metastatic disease.  Also of note her tumor was upstaged to cT2b by [name].   PREVIOUS RADIATION THERAPY: No   PAST MEDICAL HISTORY:  Past Medical History:  Diagnosis Date   Acute bronchitis 07/22/2008   Arthritis    Cataract    Diabetes mellitus without complication ([place]) 99/37/1696   Type II   Heart murmur    History of CVA (cerebrovascular accident)    HYPERLIPIDEMIA, MIXED 07/18/2007   Hypertension    Stroke ([place]) 06/26/2019   weakness R side       PAST SURGICAL HISTORY: Past Surgical History:  Procedure Laterality Date   BRONCHIAL BIOPSY  08/16/2022   Procedure: BRONCHIAL BIOPSIES;  Surgeon: Pamela Hurter, MD;  Location: [place];  Service: Pulmonary;;   BRONCHIAL BRUSHINGS  08/16/2022   Procedure: BRONCHIAL BRUSHINGS;  Surgeon: Pamela Hurter, MD;  Location: [place];  Service: Pulmonary;;   BRONCHIAL NEEDLE ASPIRATION BIOPSY  08/16/2022   Procedure: BRONCHIAL NEEDLE ASPIRATION BIOPSIES;  Surgeon: Pamela Hurter, MD;  Location: [facility] ENDOSCOPY;  Service: Pulmonary;;   ENDOBRONCHIAL ULTRASOUND  08/16/2022   Procedure: ENDOBRONCHIAL ULTRASOUND;  Surgeon: Pamela Hurter, MD;  Location: [facility] ENDOSCOPY;  Service: Pulmonary;;   EYE SURGERY Right 11/2018   implant   JOINT REPLACEMENT Bilateral    Total Knee [name]   NECK SURGERY     Remove a tumor   TYMPANOSTOMY     [name]   VIDEO BRONCHOSCOPY WITH RADIAL ENDOBRONCHIAL ULTRASOUND  08/16/2022   Procedure: VIDEO BRONCHOSCOPY WITH RADIAL ENDOBRONCHIAL  ULTRASOUND;  Surgeon: Pamela Hurter, MD;  Location: [facility] ENDOSCOPY;  Service: Pulmonary;;     FAMILY HISTORY:  Family History  Problem Relation Age of Onset   Heart disease Father        No details   Heart failure Mother    Diabetes Neg Hx    Colon cancer Neg Hx    Esophageal cancer Neg Hx    Rectal cancer Neg Hx    Stomach cancer Neg Hx      SOCIAL HISTORY:  reports that she has quit smoking. Her smoking use included cigarettes. She started smoking about 53 years ago. She has a 26.50 pack-year smoking history. She has never used smokeless tobacco. She reports that she does not currently use alcohol. She reports that she does not use drugs.  The patient is widowed and lives in [place].   ALLERGIES: Actonel [risedronate] and Hydromet [hydrocodone bit-homatrop mbr]   MEDICATIONS:  Current Outpatient Medications  Medication Sig Dispense Refill   albuterol (VENTOLIN HFA) 108 (90 Base) MCG/ACT inhaler Inhale 2 puffs into the lungs every 6 (six) hours as needed for wheezing or shortness of breath. 8 g 2   Alcohol Swabs (ALCOHOL PREP) PADS Use to test 1 X day and diagnosis code is E 11.9 100 each 1   amLODipine (NORVASC) 5 MG tablet Take 1 tablet by mouth once daily 90 tablet 0   aspirin 81 MG chewable tablet Chew 1 tablet (81 mg total) by mouth daily. 30 tablet 3   Blood Glucose Monitoring Suppl (ONETOUCH VERIO) w/Device KIT Use to check blood sugar two times daily 1 kit 0   diclofenac (VOLTAREN) 75 MG EC tablet Take 1 tablet (75 mg total) by mouth 2 (two) times daily. 180 tablet 3   furosemide (LASIX) 20 MG tablet Take 1 tablet by mouth once daily 90 tablet 0   glipiZIDE (GLUCOTROL) 10 MG tablet Take 1 tablet (10 mg total) by mouth 2 (two) times daily before a meal. 180 tablet 1   JARDIANCE 25 MG TABS tablet TAKE 1 TABLET BY MOUTH ONCE DAILY BEFORE BREAKFAST 90 tablet 0   Lancets (ONETOUCH ULTRASOFT) lancets USE   TO CHECK GLUCOSE ONCE DAILY 100 each 0   lisinopril (ZESTRIL) 10  MG tablet Take 2 tablets by mouth once daily 180 tablet 0   metFORMIN (GLUCOPHAGE) 500 MG tablet Take 1 tablet (500 mg total) by mouth 2 (two) times daily with a meal. 180 tablet 3   metoprolol succinate (TOPROL-XL) 50 MG 24 hr tablet TAKE ONE TABLET BY MOUTH WITH OR IMMEDIATELY FOLLOWING A MEAL 90 tablet 0   ONETOUCH VERIO test strip USE 1 STRIP TO CHECK GLUCOSE ONCE DAILY 50 each 0   risedronate (ACTONEL) 5 MG tablet Take 5 mg by mouth every morning.     rosuvastatin (CRESTOR) 40 MG tablet Take 1 tablet (40 mg total) by mouth daily. 90 tablet 3   spironolactone (ALDACTONE) 25 MG tablet Take 1 tablet by  mouth once daily 90 tablet 0   No current facility-administered medications for this visit.     REVIEW OF SYSTEMS: On review of systems, the patient reports that ***She reports using a walker at home or when shopping  but is able to drive. She has shortness of breath with exertion and a daily productive cough without fevers or hemoptysis. She reports generalized pruritis, and poor control of her diabetes with peripheral neuropathy in her feet. She reports unknown amount of weight loss that's been unintentional. No other complaints are verbalized.      PHYSICAL EXAM:  Wt Readings from Last 3 Encounters:  10/16/22 215 lb 1.6 oz (97.6 kg)  09/26/22 214 lb (97.1 kg)  09/11/22 214 lb 1.6 oz (97.1 kg)   Temp Readings from Last 3 Encounters:  10/16/22 98.2 F (36.8 C) (Oral)  09/11/22 98.1 F (36.7 C) (Oral)  08/28/22 98.8 F (37.1 C)   BP Readings from Last 3 Encounters:  10/16/22 (!) 152/76  09/26/22 (!) 146/68  09/11/22 124/61   Pulse Readings from Last 3 Encounters:  10/16/22 62  09/26/22 76  09/11/22 72    /10  In general this is a chronically ill appearing African-American female in no acute distress.  She's alert and oriented x4 and appropriate throughout the examination. Cardiopulmonary assessment is negative for acute distress and she exhibits normal effort.     ECOG =  1-2  0 - Asymptomatic (Fully active, able to carry on all predisease activities without restriction)  1 - Symptomatic but completely ambulatory (Restricted in physically strenuous activity but ambulatory and able to carry out work of a light or sedentary nature. For example, light housework, office work)  2 - Symptomatic, <50% in bed during the day (Ambulatory and capable of all self care but unable to carry out any work activities. Up and about more than 50% of waking hours)  3 - Symptomatic, >50% in bed, but not bedbound (Capable of only limited self-care, confined to bed or chair 50% or more of waking hours)  4 - Bedbound (Completely disabled. Cannot carry on any self-care. Totally confined to bed or chair)  5 - Death   Eustace Pen MM, Creech RH, Tormey DC, et al. [phone]). "Toxicity and response criteria of the [facility]". [place] Oncol. 5 (6): 649-55    LABORATORY DATA:  Lab Results  Component Value Date   WBC 6.5 09/11/2022   HGB 11.4 (L) 09/11/2022   HCT 35.1 (L) 09/11/2022   MCV 94.9 09/11/2022   PLT 324 09/11/2022   Lab Results  Component Value Date   NA 138 09/11/2022   K 4.9 09/11/2022   CL 107 09/11/2022   CO2 22 09/11/2022   Lab Results  Component Value Date   ALT 35 09/11/2022   AST 23 09/11/2022   ALKPHOS 64 09/11/2022   BILITOT 0.3 09/11/2022      RADIOGRAPHY: No results found.     IMPRESSION/PLAN: 1. Stage IIA cT2bN0Mx, NSCLC, Squamous Cell Carcinoma of the RUL. Pamela. Lisbeth Renshaw discusses the pathology findings and reviews the nature of early stage lung cancer.  Again we discussed that the standard of care is for surgical resection. However for patients who are not medical candidates to undergo surgery, or who choose to forgo surgery, stereotactic body radiotherapy (SBRT) is an appropriate alternative.  Pamela. Lisbeth Renshaw would still offer stereotactic body radiotherapy in 3-5 fractions versus ultra hypofractionated course depending on her  simulation imaging.  The patient is interested in  proceeding.Written consent is obtained and placed in the chart, a copy was provided to the patient. The patient will be contacted to coordinate treatment planning by our simulation department.  2. Left adrenal lesion. This was discussed in thoracic oncology conference and was felt to be low level activity and should be watched but it was not felt to be malignant.  In a visit lasting *** minutes, greater than 50% of the time was spent face to face discussing the patient's condition, in preparation for the discussion, and coordinating the patient's care.      Carola Rhine, [facility]   **Disclaimer: This note was dictated with voice recognition software. Similar sounding words can inadvertently be transcribed and this note may contain transcription errors which may not have been corrected upon publication of note.**

## 2022-10-24 ENCOUNTER — Ambulatory Visit
Admission: RE | Admit: 2022-10-24 | Discharge: 2022-10-24 | Disposition: A | Discharge status: Home / Self Care | Payer: Medicare Other | Source: Ambulatory Visit | Attending: Radiation Oncology | Admitting: Radiation Oncology

## 2022-10-24 ENCOUNTER — Encounter: Payer: Self-pay | Admitting: Radiation Oncology

## 2022-10-24 DIAGNOSIS — C3411 Malignant neoplasm of upper lobe, right bronchus or lung: Secondary | ICD-10-CM | POA: Diagnosis not present

## 2022-10-24 DIAGNOSIS — Z87891 Personal history of nicotine dependence: Secondary | ICD-10-CM | POA: Diagnosis not present

## 2022-10-24 NOTE — Progress Notes (Signed)
Telephone nursing appointment for Malignant neoplasm of upper lobe of right lung ([place]). I verified patient's identity and began nursing interview. Patient reports overall improvements in her coughing, and SOB. No issues reported at this time.   Meaningful use complete.   Patient aware of their 2:30pm-10/24/22 telephone appointment w/ Tiffany Kocher. I left my extension [phone] in case patient needs anything. Patient verbalized understanding. This concludes the nursing interview.   Patient contact [phone]     Leandra Kern, LPN

## 2022-10-25 DIAGNOSIS — Z87891 Personal history of nicotine dependence: Secondary | ICD-10-CM | POA: Diagnosis not present

## 2022-10-25 DIAGNOSIS — C3411 Malignant neoplasm of upper lobe, right bronchus or lung: Secondary | ICD-10-CM | POA: Diagnosis not present

## 2022-10-29 ENCOUNTER — Other Ambulatory Visit: Payer: Self-pay | Admitting: Family Medicine

## 2022-10-30 ENCOUNTER — Ambulatory Visit
Admission: RE | Admit: 2022-10-30 | Discharge: 2022-10-30 | Disposition: A | Discharge status: Home / Self Care | Payer: Medicare Other | Source: Ambulatory Visit | Attending: Radiation Oncology | Admitting: Radiation Oncology

## 2022-10-30 DIAGNOSIS — Z87891 Personal history of nicotine dependence: Secondary | ICD-10-CM | POA: Diagnosis not present

## 2022-10-30 DIAGNOSIS — Z510 Encounter for antineoplastic radiation therapy: Secondary | ICD-10-CM | POA: Diagnosis not present

## 2022-10-30 DIAGNOSIS — C3411 Malignant neoplasm of upper lobe, right bronchus or lung: Secondary | ICD-10-CM | POA: Diagnosis not present

## 2022-10-31 ENCOUNTER — Encounter: Payer: Self-pay | Admitting: Pulmonary Disease

## 2022-10-31 ENCOUNTER — Ambulatory Visit (INDEPENDENT_AMBULATORY_CARE_PROVIDER_SITE_OTHER): Payer: Medicare Other | Admitting: Pulmonary Disease

## 2022-10-31 VITALS — BP 132/84 | HR 80 | Ht 64.0 in | Wt 222.0 lb

## 2022-10-31 DIAGNOSIS — C3491 Malignant neoplasm of unspecified part of right bronchus or lung: Secondary | ICD-10-CM

## 2022-10-31 NOTE — Patient Instructions (Addendum)
Follow up in 2 months after you radiation treatment  Glad you are doing well and happy holidays!

## 2022-10-31 NOTE — Progress Notes (Signed)
Synopsis: Referred in July 2023 for hospital follow up for pneumonia  Subjective:   PATIENT ID: [name] GENDER: female DOB: 1946/06/02, MRN: [id number]  HPI  Chief Complaint  Patient presents with   Follow-up   [name] is a 76 year old woman, former smoker with hypertension, CVA with residual right sided weakness and DMII who returns to pulmonary clinic for follow up of RUL cavitary lesion.   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 She established care with Dr. Earlie Server of Oncology and was last seen 10/16/22 in their clinic. She met with Dr. Wyvonnia Lora regarding resection of the mass but then decided the surgery was too risky for her and she elected for radiation therapy.   She has been doing well overall and is without complaints today. She denies hemoptysis.   OV 08/01/22 Repeat CT Chest scan 07/26/22 shows increase in RUL cavitary lesion size. She denies cough or hemoptysis.   Initial OV 06/19/22 She was admitted 6/25 to 6/27 for concern of aspiration pneumonia vs cavitary mass. She presented with fever, cough, sore throat and swollen glands of her neck. CT Chest showed a thick walled cavitary lung mass measuring 3.9 x 2.8 x 2.8cm and small satellite nodule measuring 59m within the subpleural aspect of the posterior right upper lobe. No enlarged mediastinal or hilar lymph nodes.    Speech evaluation was notable for mild aspiration risk. She was having difficulty swallowing due to her sore throat and swollen glands in her neck.   She was started on broad spectrum antibiotics with improvement in her symptoms. Extended respiratory viral panel was negative. MRSA screen was negative. Urine legionella and strep pneumoniae antigens were negative.  She was discharged with 1 month of augmentin therapy since she had swift response to treatment in hospital and bronchoscopy was deferred until further follow up.    Since discharge, she has been doing great since being home. She feels she is back to baseline. She has no cough and no sputum production. No hemoptysis. She denies weight loss, fevers, chills or night sweats.   Past Medical History:  Diagnosis Date   Acute bronchitis 07/22/2008   Arthritis    Cataract    Diabetes mellitus without complication [place] 040/[id number]/3532  Type II   Heart murmur    History of CVA (cerebrovascular accident)    HYPERLIPIDEMIA, MIXED 07/18/2007   Hypertension    [place]   weakness R side     Family History  Problem Relation Age of Onset   Heart disease Father        No details   Heart failure Mother    Diabetes Neg Hx    Colon cancer Neg Hx    Esophageal cancer Neg Hx    Rectal cancer Neg Hx    Stomach cancer Neg Hx      Social History   Socioeconomic History   Marital status: Widowed    Spouse name: Not on file   Number of children: 0   Years of education: 166  Highest education level: Not on file  Occupational History   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    Hx raising of her deceased sister's 3 children, and is now raising/supporting 10yo  'great-grandson'.    Attends church, church community as support system. Many of her family has passed away.   Enjoys watching TV; used to enjoy going fishing         Social Determinants of Health    Financial Resource Strain: Low Risk  (07/16/2022)   Overall Financial Resource Strain (CARDIA)    Difficulty of Paying Living Expenses: Not hard at all  Food Insecurity: No Food Insecurity (07/16/2022)   Hunger Vital Sign    Worried About Running Out of Food in the Last Year: Never true    Ran Out of Food in the Last Year: Never true  Transportation Needs: No Transportation Needs (07/16/2022)   PRAPARE - Hydrologist (Medical): No    Lack of Transportation (Non-Medical): No  Physical Activity: Inactive (07/16/2022)   Exercise Vital Sign    Days of Exercise per Week: 0 days    Minutes of Exercise per Session: 0 min  Stress: No Stress Concern Present (07/16/2022)   [place]    Feeling of Stress : Not at all  Social Connections: Moderately Integrated (07/16/2022)   Social Connection and Isolation Panel [NHANES]    Frequency of Communication with Friends and Family: More than three times a week    Frequency of Social Gatherings with Friends and Family: More than three times a week    Attends Religious Services: More than 4 times per year    Active Member of [employer] or Organizations: Yes    Attends Archivist Meetings: More than 4 times per year    Marital Status: Widowed  Intimate Partner Violence: Not At Risk (07/16/2022)   Humiliation, Afraid, Rape, and Kick questionnaire    Fear of Current or Ex-Partner: No    Emotionally Abused: No    Physically Abused: No    Sexually Abused: No     Allergies  Allergen Reactions   Actonel [Risedronate] Other (See Comments)    It made her "feel like I'm going backwards"    Hydromet [Hydrocodone Bit-Homatrop Mbr] Itching     Outpatient Medications Prior to Visit  Medication Sig Dispense Refill   albuterol (VENTOLIN HFA) 108 (90 Base) MCG/ACT inhaler Inhale 2 puffs into the lungs every 6 (six) hours as needed for wheezing or shortness of  breath. 8 g 2   Alcohol Swabs (ALCOHOL PREP) PADS Use to test 1 X day and diagnosis code is E 11.9 100 each 1   amLODipine (NORVASC) 5 MG tablet Take 1 tablet by mouth once daily 90 tablet 0   aspirin 81 MG chewable tablet Chew 1 tablet (81 mg total) by mouth daily. 30 tablet 3   Blood Glucose Monitoring Suppl (ONETOUCH VERIO) w/Device KIT Use to check blood sugar two times daily 1 kit 0   diclofenac (VOLTAREN) 75 MG EC tablet Take 1 tablet (75 mg total) by mouth 2 (two) times daily. 180 tablet  3   furosemide (LASIX) 20 MG tablet Take 1 tablet by mouth once daily 90 tablet 0   glipiZIDE (GLUCOTROL) 10 MG tablet Take 1 tablet (10 mg total) by mouth 2 (two) times daily before a meal. 180 tablet 1   JARDIANCE 25 MG TABS tablet TAKE 1 TABLET BY MOUTH ONCE DAILY BEFORE BREAKFAST 90 tablet 0   Lancets (ONETOUCH ULTRASOFT) lancets USE   TO CHECK GLUCOSE ONCE DAILY 100 each 0   lisinopril (ZESTRIL) 10 MG tablet Take 2 tablets by mouth once daily 180 tablet 0   metFORMIN (GLUCOPHAGE) 500 MG tablet TAKE 1 TABLET BY MOUTH TWICE DAILY WITH A MEAL 180 tablet 0   ONETOUCH VERIO test strip USE 1 STRIP TO CHECK GLUCOSE ONCE DAILY 50 each 0   risedronate (ACTONEL) 5 MG tablet Take 5 mg by mouth every morning.     rosuvastatin (CRESTOR) 40 MG tablet Take 1 tablet (40 mg total) by mouth daily. 90 tablet 3   spironolactone (ALDACTONE) 25 MG tablet Take 1 tablet by mouth once daily 90 tablet 0   metoprolol succinate (TOPROL-XL) 50 MG 24 hr tablet TAKE ONE TABLET BY MOUTH WITH OR IMMEDIATELY FOLLOWING A MEAL 90 tablet 0   No facility-administered medications prior to visit.   Review of Systems  Constitutional:  Negative for chills, fever, malaise/fatigue and weight loss.  HENT:  Negative for congestion, sinus pain and sore throat.   Eyes: Negative.   Respiratory:  Negative for cough, hemoptysis, sputum production, shortness of breath and wheezing.   Cardiovascular:  Negative for chest pain, palpitations,  orthopnea, claudication and leg swelling.  Gastrointestinal:  Negative for abdominal pain, heartburn, nausea and vomiting.  Genitourinary: Negative.   Musculoskeletal:  Negative for joint pain and myalgias.  Skin:  Negative for rash.  Neurological:  Negative for weakness.  Endo/Heme/Allergies: Negative.   Psychiatric/Behavioral: Negative.     Objective:   Vitals:   10/31/22 1100  BP: 132/84  Pulse: 80  SpO2: 97%  Weight: 222 lb (100.7 kg)  Height: _0  (1.626 m)     Physical Exam Constitutional:      General: She is not in acute distress.    Appearance: She is obese. She is not ill-appearing.  HENT:     Head: Normocephalic and atraumatic.  Eyes:     General: No scleral icterus. Cardiovascular:     Rate and Rhythm: Normal rate and regular rhythm.     Pulses: Normal pulses.     Heart sounds: Normal heart sounds. No murmur heard. Pulmonary:     Effort: Pulmonary effort is normal.     Breath sounds: Normal breath sounds. No wheezing, rhonchi or rales.  Musculoskeletal:     Right lower leg: No edema.     Left lower leg: No edema.  Skin:    General: Skin is warm and dry.  Neurological:     General: No focal deficit present.     Mental Status: She is alert.    CBC    Component Value Date/Time   WBC 6.5 09/11/2022 1343   WBC 8.7 08/16/2022 0616   RBC 3.70 (L) 09/11/2022 1343   HGB 11.4 (L) 09/11/2022 1343   HCT 35.1 (L) 09/11/2022 1343   PLT 324 09/11/2022 1343   MCV 94.9 09/11/2022 1343   MCH 30.8 09/11/2022 1343   MCHC 32.5 09/11/2022 1343   RDW 16.1 (H) 09/11/2022 1343   LYMPHSABS 2.4 09/11/2022 1343   MONOABS 0.9 09/11/2022 1343   EOSABS  0.3 09/11/2022 1343   BASOSABS 0.0 09/11/2022 1343      Latest Ref Rng & Units 09/11/2022    1:43 PM 08/16/2022    6:16 AM 06/01/2022    1:39 PM  BMP  Glucose 70 - 99 mg/dL 202  124  79   BUN 8 - 23 mg/dL 52  47  45   Creatinine 0.44 - 1.00 mg/dL 2.24  1.82  1.67   Sodium 135 - 145 mmol/L 138  139  137   Potassium  3.5 - 5.1 mmol/L 4.9  4.6  4.6   Chloride 98 - 111 mmol/L 107  107  103   CO2 22 - 32 mmol/L _0 Calcium 8.9 - 10.3 mg/dL 9.9  10.0  10.2    Chest imaging: CT Chest 07/26/22 1. Irregular thick wall cavitary lesion in the right upper lobe, slightly increased in size from the prior exam. The possibility of underlying neoplasm can not be excluded. Pulmonary consultation is recommended. Consider one of the following in 3 months for both low-risk and high-risk individuals: (a) repeat chest CT, (b) follow-up PET-CT, or (c) tissue sampling. This recommendation follows the consensus statement: Guidelines for Management of Incidental Pulmonary Nodules Detected on CT Images: From the Fleischner Society 2017; Radiology 2017; 284:228-243. 2. Stable 7 mm right upper lobe pulmonary nodule. 3. Cardiomegaly with coronary artery calcifications. 4. Aortic atherosclerosis.  CT Chest 05/20/22 1. Thick walled cavitary lung mass within the posterior right upper lobe is identified and is worrisome for primary bronchogenic carcinoma. Squamous cell carcinoma may show cavitation within the lungs. Differential consideration include infectious/inflammatory process. Given the absence of additional cavitary lung lesions septic emboli is considered less favored. 2. No signs of nodal metastasis or distant metastatic disease. 3. Sigmoid diverticulosis without signs of acute diverticulitis. 4. Aortic Atherosclerosis (ICD10-I70.0).  PFT:     No data to display          Labs:  Path:  Echo:  Heart Catheterization:  Assessment & Plan:   Squamous cell carcinoma of right lung (HCC)  Discussion: [name] is a 76 year old woman, former smoker with hypertension, CVA with residual right sided weakness and DMII who returns to pulmonary clinic for follow up of RUL cavitary lesion  Bronchosopy 08/16/22 showed squamous cell carcinoma of the lung. She is following by oncology and radiation oncology.    She can continue as needed albuterol for her breathing.   Follow up in 2 months.   Freda Jackson, MD [facility] Office: [phone]   Current Outpatient Medications:    albuterol (VENTOLIN HFA) 108 (90 Base) MCG/ACT inhaler, Inhale 2 puffs into the lungs every 6 (six) hours as needed for wheezing or shortness of breath., Disp: 8 g, Rfl: 2   Alcohol Swabs (ALCOHOL PREP) PADS, Use to test 1 X day and diagnosis code is E 11.9, Disp: 100 each, Rfl: 1   amLODipine (NORVASC) 5 MG tablet, Take 1 tablet by mouth once daily, Disp: 90 tablet, Rfl: 0   aspirin 81 MG chewable tablet, Chew 1 tablet (81 mg total) by mouth daily., Disp: 30 tablet, Rfl: 3   Blood Glucose Monitoring Suppl (ONETOUCH VERIO) w/Device KIT, Use to check blood sugar two times daily, Disp: 1 kit, Rfl: 0   diclofenac (VOLTAREN) 75 MG EC tablet, Take 1 tablet (75 mg total) by mouth 2 (two) times daily., Disp: 180 tablet, Rfl: 3   furosemide (LASIX) 20 MG tablet, Take  1 tablet by mouth once daily, Disp: 90 tablet, Rfl: 0   glipiZIDE (GLUCOTROL) 10 MG tablet, Take 1 tablet (10 mg total) by mouth 2 (two) times daily before a meal., Disp: 180 tablet, Rfl: 1   JARDIANCE 25 MG TABS tablet, TAKE 1 TABLET BY MOUTH ONCE DAILY BEFORE BREAKFAST, Disp: 90 tablet, Rfl: 0   Lancets (ONETOUCH ULTRASOFT) lancets, USE   TO CHECK GLUCOSE ONCE DAILY, Disp: 100 each, Rfl: 0   lisinopril (ZESTRIL) 10 MG tablet, Take 2 tablets by mouth once daily, Disp: 180 tablet, Rfl: 0   metFORMIN (GLUCOPHAGE) 500 MG tablet, TAKE 1 TABLET BY MOUTH TWICE DAILY WITH A MEAL, Disp: 180 tablet, Rfl: 0   ONETOUCH VERIO test strip, USE 1 STRIP TO CHECK GLUCOSE ONCE DAILY, Disp: 50 each, Rfl: 0   risedronate (ACTONEL) 5 MG tablet, Take 5 mg by mouth every morning., Disp: , Rfl:    rosuvastatin (CRESTOR) 40 MG tablet, Take 1 tablet (40 mg total) by mouth daily., Disp: 90 tablet, Rfl: 3   spironolactone (ALDACTONE) 25 MG tablet, Take 1 tablet by  mouth once daily, Disp: 90 tablet, Rfl: 0   metoprolol succinate (TOPROL-XL) 50 MG 24 hr tablet, TAKE 1 TABLET BY MOUTH ONCE DAILY WITH MEALS OR  IMMEDIATELY  FOLLOWING  A  MEAL, Disp: 90 tablet, Rfl: 0

## 2022-11-07 DIAGNOSIS — Z87891 Personal history of nicotine dependence: Secondary | ICD-10-CM | POA: Diagnosis not present

## 2022-11-07 DIAGNOSIS — C3411 Malignant neoplasm of upper lobe, right bronchus or lung: Secondary | ICD-10-CM | POA: Diagnosis not present

## 2022-11-08 DIAGNOSIS — Z87891 Personal history of nicotine dependence: Secondary | ICD-10-CM | POA: Diagnosis not present

## 2022-11-08 DIAGNOSIS — C3411 Malignant neoplasm of upper lobe, right bronchus or lung: Secondary | ICD-10-CM | POA: Diagnosis not present

## 2022-11-08 DIAGNOSIS — Z510 Encounter for antineoplastic radiation therapy: Secondary | ICD-10-CM | POA: Diagnosis not present

## 2022-11-09 ENCOUNTER — Other Ambulatory Visit: Payer: Self-pay | Admitting: Family Medicine

## 2022-11-10 ENCOUNTER — Encounter: Payer: Self-pay | Admitting: Pulmonary Disease

## 2022-11-12 ENCOUNTER — Other Ambulatory Visit: Payer: Self-pay

## 2022-11-12 ENCOUNTER — Encounter: Payer: Self-pay | Admitting: Radiation Oncology

## 2022-11-12 ENCOUNTER — Ambulatory Visit
Admission: RE | Admit: 2022-11-12 | Discharge: 2022-11-12 | Disposition: A | Discharge status: Home / Self Care | Payer: Medicare Other | Source: Ambulatory Visit | Attending: Radiation Oncology | Admitting: Radiation Oncology

## 2022-11-12 DIAGNOSIS — C3411 Malignant neoplasm of upper lobe, right bronchus or lung: Secondary | ICD-10-CM | POA: Diagnosis not present

## 2022-11-12 DIAGNOSIS — Z510 Encounter for antineoplastic radiation therapy: Secondary | ICD-10-CM | POA: Diagnosis not present

## 2022-11-12 DIAGNOSIS — Z87891 Personal history of nicotine dependence: Secondary | ICD-10-CM | POA: Diagnosis not present

## 2022-11-12 LAB — RAD ONC ARIA SESSION SUMMARY
Course Elapsed Days: 0
Plan Fractions Treated to Date: 1
Plan Prescribed Dose Per Fraction: 7.5 Gy
Plan Total Fractions Prescribed: 8
Plan Total Prescribed Dose: 60 Gy
Reference Point Dosage Given to Date: 7.5 Gy
Reference Point Session Dosage Given: 7.5 Gy
Session Number: 1

## 2022-11-12 NOTE — Progress Notes (Signed)
The patient was seen today in the treatment area.  She was confused about which side of her body her cancer is located on and she is in the midst of beginning treatment for stage IIA [id number], NSCLC, Squamous Cell Carcinoma of the RUL.  I printed copies of her biopsy and PET scan to confirm that her tumor in fact is on the right-hand side, Dr. Lisbeth Renshaw also came back to the treatment area to confirm with the patient after discussing this she was in agreement to pursue her SBRT treatment today.

## 2022-11-13 ENCOUNTER — Ambulatory Visit: Payer: Medicare Other | Admitting: Radiation Oncology

## 2022-11-13 ENCOUNTER — Ambulatory Visit
Admission: RE | Admit: 2022-11-13 | Discharge: 2022-11-13 | Disposition: A | Discharge status: Home / Self Care | Payer: Medicare Other | Source: Ambulatory Visit | Attending: Radiation Oncology | Admitting: Radiation Oncology

## 2022-11-13 ENCOUNTER — Other Ambulatory Visit: Payer: Self-pay

## 2022-11-13 DIAGNOSIS — Z510 Encounter for antineoplastic radiation therapy: Secondary | ICD-10-CM | POA: Diagnosis not present

## 2022-11-13 DIAGNOSIS — C3411 Malignant neoplasm of upper lobe, right bronchus or lung: Secondary | ICD-10-CM | POA: Diagnosis not present

## 2022-11-13 DIAGNOSIS — Z87891 Personal history of nicotine dependence: Secondary | ICD-10-CM | POA: Diagnosis not present

## 2022-11-13 LAB — RAD ONC ARIA SESSION SUMMARY
Course Elapsed Days: 1
Plan Fractions Treated to Date: 2
Plan Prescribed Dose Per Fraction: 7.5 Gy
Plan Total Fractions Prescribed: 8
Plan Total Prescribed Dose: 60 Gy
Reference Point Dosage Given to Date: 15 Gy
Reference Point Session Dosage Given: 7.5 Gy
Session Number: 2

## 2022-11-14 ENCOUNTER — Ambulatory Visit
Admission: RE | Admit: 2022-11-14 | Discharge: 2022-11-14 | Disposition: A | Discharge status: Home / Self Care | Payer: Medicare Other | Source: Ambulatory Visit | Attending: Radiation Oncology | Admitting: Radiation Oncology

## 2022-11-14 ENCOUNTER — Other Ambulatory Visit: Payer: Self-pay

## 2022-11-14 DIAGNOSIS — C3411 Malignant neoplasm of upper lobe, right bronchus or lung: Secondary | ICD-10-CM | POA: Diagnosis not present

## 2022-11-14 DIAGNOSIS — Z510 Encounter for antineoplastic radiation therapy: Secondary | ICD-10-CM | POA: Diagnosis not present

## 2022-11-14 DIAGNOSIS — Z87891 Personal history of nicotine dependence: Secondary | ICD-10-CM | POA: Diagnosis not present

## 2022-11-14 LAB — RAD ONC ARIA SESSION SUMMARY
Course Elapsed Days: 2
Plan Fractions Treated to Date: 3
Plan Prescribed Dose Per Fraction: 7.5 Gy
Plan Total Fractions Prescribed: 8
Plan Total Prescribed Dose: 60 Gy
Reference Point Dosage Given to Date: 22.5 Gy
Reference Point Session Dosage Given: 7.5 Gy
Session Number: 3

## 2022-11-15 ENCOUNTER — Ambulatory Visit: Payer: Medicare Other | Admitting: Radiation Oncology

## 2022-11-15 ENCOUNTER — Other Ambulatory Visit: Payer: Self-pay

## 2022-11-15 ENCOUNTER — Ambulatory Visit
Admission: RE | Admit: 2022-11-15 | Discharge: 2022-11-15 | Disposition: A | Discharge status: Home / Self Care | Payer: Medicare Other | Source: Ambulatory Visit | Attending: Radiation Oncology | Admitting: Radiation Oncology

## 2022-11-15 DIAGNOSIS — C3411 Malignant neoplasm of upper lobe, right bronchus or lung: Secondary | ICD-10-CM | POA: Diagnosis not present

## 2022-11-15 DIAGNOSIS — Z87891 Personal history of nicotine dependence: Secondary | ICD-10-CM | POA: Diagnosis not present

## 2022-11-15 DIAGNOSIS — Z510 Encounter for antineoplastic radiation therapy: Secondary | ICD-10-CM | POA: Diagnosis not present

## 2022-11-15 LAB — RAD ONC ARIA SESSION SUMMARY
Course Elapsed Days: 3
Plan Fractions Treated to Date: 4
Plan Prescribed Dose Per Fraction: 7.5 Gy
Plan Total Fractions Prescribed: 8
Plan Total Prescribed Dose: 60 Gy
Reference Point Dosage Given to Date: 30 Gy
Reference Point Session Dosage Given: 7.5 Gy
Session Number: 4

## 2022-11-16 ENCOUNTER — Ambulatory Visit
Admission: RE | Admit: 2022-11-16 | Discharge: 2022-11-16 | Disposition: A | Discharge status: Home / Self Care | Payer: Medicare Other | Source: Ambulatory Visit | Attending: Radiation Oncology | Admitting: Radiation Oncology

## 2022-11-16 ENCOUNTER — Other Ambulatory Visit: Payer: Self-pay

## 2022-11-16 DIAGNOSIS — Z87891 Personal history of nicotine dependence: Secondary | ICD-10-CM | POA: Diagnosis not present

## 2022-11-16 DIAGNOSIS — C3411 Malignant neoplasm of upper lobe, right bronchus or lung: Secondary | ICD-10-CM | POA: Diagnosis not present

## 2022-11-16 DIAGNOSIS — Z510 Encounter for antineoplastic radiation therapy: Secondary | ICD-10-CM | POA: Diagnosis not present

## 2022-11-16 LAB — RAD ONC ARIA SESSION SUMMARY
Course Elapsed Days: 4
Plan Fractions Treated to Date: 5
Plan Prescribed Dose Per Fraction: 7.5 Gy
Plan Total Fractions Prescribed: 8
Plan Total Prescribed Dose: 60 Gy
Reference Point Dosage Given to Date: 37.5 Gy
Reference Point Session Dosage Given: 7.5 Gy
Session Number: 5

## 2022-11-19 ENCOUNTER — Encounter: Payer: Self-pay | Admitting: Radiation Oncology

## 2022-11-20 ENCOUNTER — Other Ambulatory Visit: Payer: Self-pay

## 2022-11-20 ENCOUNTER — Ambulatory Visit
Admission: RE | Admit: 2022-11-20 | Discharge: 2022-11-20 | Disposition: A | Discharge status: Home / Self Care | Payer: Medicare Other | Source: Ambulatory Visit | Attending: Radiation Oncology | Admitting: Radiation Oncology

## 2022-11-20 DIAGNOSIS — Z87891 Personal history of nicotine dependence: Secondary | ICD-10-CM | POA: Diagnosis not present

## 2022-11-20 DIAGNOSIS — C3411 Malignant neoplasm of upper lobe, right bronchus or lung: Secondary | ICD-10-CM | POA: Diagnosis not present

## 2022-11-20 DIAGNOSIS — Z510 Encounter for antineoplastic radiation therapy: Secondary | ICD-10-CM | POA: Diagnosis not present

## 2022-11-20 LAB — RAD ONC ARIA SESSION SUMMARY
Course Elapsed Days: 8
Plan Fractions Treated to Date: 6
Plan Prescribed Dose Per Fraction: 7.5 Gy
Plan Total Fractions Prescribed: 8
Plan Total Prescribed Dose: 60 Gy
Reference Point Dosage Given to Date: 45 Gy
Reference Point Session Dosage Given: 7.5 Gy
Session Number: 6

## 2022-11-21 ENCOUNTER — Other Ambulatory Visit: Payer: Self-pay

## 2022-11-21 ENCOUNTER — Ambulatory Visit
Admission: RE | Admit: 2022-11-21 | Discharge: 2022-11-21 | Disposition: A | Discharge status: Home / Self Care | Payer: Medicare Other | Source: Ambulatory Visit | Attending: Radiation Oncology | Admitting: Radiation Oncology

## 2022-11-21 DIAGNOSIS — Z87891 Personal history of nicotine dependence: Secondary | ICD-10-CM | POA: Diagnosis not present

## 2022-11-21 DIAGNOSIS — C3411 Malignant neoplasm of upper lobe, right bronchus or lung: Secondary | ICD-10-CM | POA: Diagnosis not present

## 2022-11-21 DIAGNOSIS — Z510 Encounter for antineoplastic radiation therapy: Secondary | ICD-10-CM | POA: Diagnosis not present

## 2022-11-21 LAB — RAD ONC ARIA SESSION SUMMARY
Course Elapsed Days: 9
Plan Fractions Treated to Date: 7
Plan Prescribed Dose Per Fraction: 7.5 Gy
Plan Total Fractions Prescribed: 8
Plan Total Prescribed Dose: 60 Gy
Reference Point Dosage Given to Date: 52.5 Gy
Reference Point Session Dosage Given: 7.5 Gy
Session Number: 7

## 2022-11-22 ENCOUNTER — Other Ambulatory Visit: Payer: Self-pay

## 2022-11-22 ENCOUNTER — Ambulatory Visit: Payer: Medicare Other | Admitting: Radiation Oncology

## 2022-11-22 ENCOUNTER — Ambulatory Visit
Admission: RE | Admit: 2022-11-22 | Discharge: 2022-11-22 | Disposition: A | Discharge status: Home / Self Care | Payer: Medicare Other | Source: Ambulatory Visit | Attending: Radiation Oncology | Admitting: Radiation Oncology

## 2022-11-22 ENCOUNTER — Encounter: Payer: Self-pay | Admitting: Radiation Oncology

## 2022-11-22 DIAGNOSIS — C3411 Malignant neoplasm of upper lobe, right bronchus or lung: Secondary | ICD-10-CM | POA: Insufficient documentation

## 2022-11-22 DIAGNOSIS — Z510 Encounter for antineoplastic radiation therapy: Secondary | ICD-10-CM | POA: Diagnosis not present

## 2022-11-22 DIAGNOSIS — Z87891 Personal history of nicotine dependence: Secondary | ICD-10-CM | POA: Diagnosis not present

## 2022-11-22 LAB — RAD ONC ARIA SESSION SUMMARY
Course Elapsed Days: 10
Plan Fractions Treated to Date: 8
Plan Prescribed Dose Per Fraction: 7.5 Gy
Plan Total Fractions Prescribed: 8
Plan Total Prescribed Dose: 60 Gy
Reference Point Dosage Given to Date: 60 Gy
Reference Point Session Dosage Given: 7.5 Gy
Session Number: 8

## 2022-11-29 NOTE — Progress Notes (Signed)
                                                                                                                                                             Patient Name: [name] MRN: [id number] DOB: 06-19-1946 Referring Physician: Alysia Penna (Profile Not Attached) Date of Service: 11/22/2022 [facility], [place]                                                        End Of Treatment Note  Diagnoses: C34.11-Malignant neoplasm of upper lobe, right bronchus or lung  Cancer Staging: Stage IIA cT2bN0Mx, NSCLC, Squamous Cell Carcinoma of the RUL   Intent: Curative  Radiation Treatment Dates:  11/12/2022 through 11/22/2022  Ultrahypofractionated Radiotherapy Site Technique Total Dose (Gy) Dose per Fx (Gy) Completed Fx Beam Energies  Lung, Right: Lung_R_RUL IMRT 60/60 7.5 8/8 [id number]   Narrative: The patient tolerated radiation therapy relatively well without complaints of side effects by the conclusion of her treatment.  Plan: The patient will receive a call in about one month from the radiation oncology department. She will continue follow up with Dr. Julien Nordmann as well.   ________________________________________________    Carola Rhine, [facility]

## 2022-12-25 ENCOUNTER — Telehealth: Payer: Self-pay | Admitting: Internal Medicine

## 2022-12-25 NOTE — Telephone Encounter (Signed)
Rescheduled 02/20 appointment due to provider on-call. Patient has been called and notified of upcoming rescheduled appointment.

## 2022-12-27 ENCOUNTER — Other Ambulatory Visit: Payer: Self-pay | Admitting: Family Medicine

## 2023-01-15 ENCOUNTER — Ambulatory Visit: Payer: Medicare Other | Admitting: Internal Medicine

## 2023-01-15 ENCOUNTER — Other Ambulatory Visit: Payer: Medicare Other

## 2023-01-18 ENCOUNTER — Encounter (HOSPITAL_COMMUNITY): Payer: Self-pay

## 2023-01-18 ENCOUNTER — Ambulatory Visit (HOSPITAL_COMMUNITY)
Admission: RE | Admit: 2023-01-18 | Discharge: 2023-01-18 | Disposition: A | Discharge status: Home / Self Care | Payer: 59 | Source: Ambulatory Visit | Attending: Internal Medicine | Admitting: Internal Medicine

## 2023-01-18 ENCOUNTER — Other Ambulatory Visit: Payer: Self-pay | Admitting: *Deleted

## 2023-01-18 DIAGNOSIS — R918 Other nonspecific abnormal finding of lung field: Secondary | ICD-10-CM | POA: Diagnosis not present

## 2023-01-18 DIAGNOSIS — C3411 Malignant neoplasm of upper lobe, right bronchus or lung: Secondary | ICD-10-CM

## 2023-01-18 DIAGNOSIS — C3490 Malignant neoplasm of unspecified part of unspecified bronchus or lung: Secondary | ICD-10-CM | POA: Diagnosis not present

## 2023-01-22 ENCOUNTER — Other Ambulatory Visit: Payer: Self-pay

## 2023-01-22 ENCOUNTER — Inpatient Hospital Stay: Payer: 59 | Attending: Internal Medicine

## 2023-01-22 ENCOUNTER — Inpatient Hospital Stay (HOSPITAL_BASED_OUTPATIENT_CLINIC_OR_DEPARTMENT_OTHER): Payer: 59 | Admitting: Internal Medicine

## 2023-01-22 VITALS — BP 156/88 | HR 86 | Temp 98.2°F | Resp 18 | Wt 224.8 lb

## 2023-01-22 DIAGNOSIS — Z79899 Other long term (current) drug therapy: Secondary | ICD-10-CM | POA: Diagnosis not present

## 2023-01-22 DIAGNOSIS — I10 Essential (primary) hypertension: Secondary | ICD-10-CM | POA: Diagnosis not present

## 2023-01-22 DIAGNOSIS — C3490 Malignant neoplasm of unspecified part of unspecified bronchus or lung: Secondary | ICD-10-CM

## 2023-01-22 DIAGNOSIS — R202 Paresthesia of skin: Secondary | ICD-10-CM | POA: Diagnosis not present

## 2023-01-22 DIAGNOSIS — Z8673 Personal history of transient ischemic attack (TIA), and cerebral infarction without residual deficits: Secondary | ICD-10-CM | POA: Insufficient documentation

## 2023-01-22 DIAGNOSIS — I700 Atherosclerosis of aorta: Secondary | ICD-10-CM | POA: Diagnosis not present

## 2023-01-22 DIAGNOSIS — E1140 Type 2 diabetes mellitus with diabetic neuropathy, unspecified: Secondary | ICD-10-CM | POA: Diagnosis not present

## 2023-01-22 DIAGNOSIS — R0609 Other forms of dyspnea: Secondary | ICD-10-CM | POA: Insufficient documentation

## 2023-01-22 DIAGNOSIS — I2510 Atherosclerotic heart disease of native coronary artery without angina pectoris: Secondary | ICD-10-CM | POA: Insufficient documentation

## 2023-01-22 DIAGNOSIS — E782 Mixed hyperlipidemia: Secondary | ICD-10-CM | POA: Diagnosis not present

## 2023-01-22 DIAGNOSIS — R5383 Other fatigue: Secondary | ICD-10-CM | POA: Insufficient documentation

## 2023-01-22 DIAGNOSIS — C3411 Malignant neoplasm of upper lobe, right bronchus or lung: Secondary | ICD-10-CM | POA: Insufficient documentation

## 2023-01-22 LAB — CMP (CANCER CENTER ONLY)
ALT: 16 U/L (ref 0–44)
AST: 14 U/L — ABNORMAL LOW (ref 15–41)
Albumin: 3.8 g/dL (ref 3.5–5.0)
Alkaline Phosphatase: 77 U/L (ref 38–126)
Anion gap: 7 (ref 5–15)
BUN: 41 mg/dL — ABNORMAL HIGH (ref 8–23)
CO2: 21 mmol/L — ABNORMAL LOW (ref 22–32)
Calcium: 9.5 mg/dL (ref 8.9–10.3)
Chloride: 110 mmol/L (ref 98–111)
Creatinine: 1.76 mg/dL — ABNORMAL HIGH (ref 0.44–1.00)
GFR, Estimated: 30 mL/min — ABNORMAL LOW (ref >60)
Glucose, Bld: 138 mg/dL — ABNORMAL HIGH (ref 70–99)
Potassium: 5.6 mmol/L — ABNORMAL HIGH (ref 3.5–5.1)
Sodium: 138 mmol/L (ref 135–145)
Total Bilirubin: 0.3 mg/dL (ref 0.3–1.2)
Total Protein: 7.8 g/dL (ref 6.5–8.1)

## 2023-01-22 LAB — CBC WITH DIFFERENTIAL (CANCER CENTER ONLY)
Abs Immature Granulocytes: 0.07 10*3/uL (ref 0.00–0.07)
Basophils Absolute: 0 10*3/uL (ref 0.0–0.1)
Basophils Relative: 1 %
Eosinophils Absolute: 0.6 10*3/uL — ABNORMAL HIGH (ref 0.0–0.5)
Eosinophils Relative: 10 %
HCT: 36.7 % (ref 36.0–46.0)
Hemoglobin: 11.8 g/dL — ABNORMAL LOW (ref 12.0–15.0)
Immature Granulocytes: 1 %
Lymphocytes Relative: 23 %
Lymphs Abs: 1.5 10*3/uL (ref 0.7–4.0)
MCH: 31.1 pg (ref 26.0–34.0)
MCHC: 32.2 g/dL (ref 30.0–36.0)
MCV: 96.6 fL (ref 80.0–100.0)
Monocytes Absolute: 0.9 10*3/uL (ref 0.1–1.0)
Monocytes Relative: 13 %
Neutro Abs: 3.5 10*3/uL (ref 1.7–7.7)
Neutrophils Relative %: 52 %
Platelet Count: 234 10*3/uL (ref 150–400)
RBC: 3.8 MIL/uL — ABNORMAL LOW (ref 3.87–5.11)
RDW: 15.2 % (ref 11.5–15.5)
WBC Count: 6.7 10*3/uL (ref 4.0–10.5)
nRBC: 0 % (ref 0.0–0.2)

## 2023-01-22 NOTE — Progress Notes (Signed)
[facility] Telephone:([phone]   Fax:([phone]) [phone]  OFFICE PROGRESS NOTE  Laurey Morale, MD [place] [address]  DIAGNOSIS:  Stage IIa (T2b, N0, M0) non-small cell lung cancer, squamous cell carcinoma presented with cavitary right upper lobe lung mass diagnosed in September 2023.   PRIOR THERAPY: status post SBRT to the right upper lobe lung nodule under the care of Dr. Lisbeth Renshaw completed on November 22, 2022.  CURRENT THERAPY: Observation.  INTERVAL HISTORY: [name] 77 y.o. female returns to the clinic today for follow-up visit accompanied by 2 of her family members.  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  She denied having any current chest pain, shortness of breath except with exertion with no cough or hemoptysis.  She has no nausea, vomiting, diarrhea or constipation.  She has no headache or visual changes.  She denied having any recent weight loss or night sweats.  She tolerated her curative radiotherapy fairly well.  The patient is here today for evaluation and repeat CT scan of the chest for restaging of her disease.  MEDICAL HISTORY: Past Medical History:  Diagnosis Date   Acute bronchitis 07/22/2008   Arthritis    Cataract    Diabetes mellitus without complication ([place]) 37/90/2409   Type II   Heart murmur    History of CVA (cerebrovascular accident)    HYPERLIPIDEMIA, MIXED 07/18/2007   Hypertension    Stroke ([place]) 06/26/2019   weakness R side    ALLERGIES:  is allergic to actonel [risedronate] and hydromet [hydrocodone bit-homatrop mbr].  MEDICATIONS:  Current Outpatient Medications  Medication Sig Dispense Refill   albuterol (VENTOLIN HFA) 108 (90 Base) MCG/ACT inhaler Inhale 2 puffs into the lungs every 6 (six) hours as needed for wheezing or shortness of breath. 8 g 2   Alcohol Swabs (ALCOHOL PREP) PADS Use to test 1 X  day and diagnosis code is E 11.9 100 each 1   amLODipine (NORVASC) 5 MG tablet Take 1 tablet by mouth once daily 90 tablet 0   aspirin 81 MG chewable tablet Chew 1 tablet (81 mg total) by mouth daily. 30 tablet 3   Blood Glucose Monitoring Suppl (ONETOUCH VERIO) w/Device KIT Use to check blood sugar two times daily 1 kit 0   diclofenac (VOLTAREN) 75 MG EC tablet Take 1 tablet (75 mg total) by mouth 2 (two) times daily. 180 tablet 3   furosemide (LASIX) 20 MG tablet Take 1 tablet by mouth once daily 90 tablet 0   glipiZIDE (GLUCOTROL) 10 MG tablet Take 1 tablet (10 mg total) by mouth 2 (two) times daily before a meal. 180 tablet 1   JARDIANCE 25 MG TABS tablet TAKE 1 TABLET BY MOUTH ONCE DAILY BEFORE BREAKFAST 90 tablet 0   Lancets (ONETOUCH ULTRASOFT) lancets USE   TO CHECK GLUCOSE ONCE DAILY 100 each 0   lisinopril (ZESTRIL) 10 MG tablet Take 2 tablets by mouth once daily 180 tablet 0   metFORMIN (GLUCOPHAGE) 500 MG tablet TAKE 1 TABLET BY MOUTH TWICE DAILY WITH A MEAL 180 tablet 0   metoprolol succinate (TOPROL-XL) 50 MG 24 hr tablet TAKE 1 TABLET BY MOUTH ONCE DAILY WITH MEALS OR  IMMEDIATELY  FOLLOWING  A  MEAL 90 tablet 0   ONETOUCH VERIO test strip USE 1 STRIP TO CHECK GLUCOSE ONCE DAILY 50 each 0   risedronate (ACTONEL) 5 MG tablet  Take 5 mg by mouth every morning.     rosuvastatin (CRESTOR) 40 MG tablet Take 1 tablet (40 mg total) by mouth daily. 90 tablet 3   spironolactone (ALDACTONE) 25 MG tablet Take 1 tablet by mouth once daily 90 tablet 0   No current facility-administered medications for this visit.    SURGICAL HISTORY:  Past Surgical History:  Procedure Laterality Date   BRONCHIAL BIOPSY  08/16/2022   Procedure: BRONCHIAL BIOPSIES;  Surgeon: Maryjane Hurter, MD;  Location: [facility];  Service: Pulmonary;;   BRONCHIAL BRUSHINGS  08/16/2022   Procedure: BRONCHIAL BRUSHINGS;  Surgeon: Maryjane Hurter, MD;  Location: [facility];  Service: Pulmonary;;   BRONCHIAL NEEDLE  ASPIRATION BIOPSY  08/16/2022   Procedure: BRONCHIAL NEEDLE ASPIRATION BIOPSIES;  Surgeon: Maryjane Hurter, MD;  Location: [facility];  Service: Pulmonary;;   ENDOBRONCHIAL ULTRASOUND  08/16/2022   Procedure: ENDOBRONCHIAL ULTRASOUND;  Surgeon: Maryjane Hurter, MD;  Location: [facility];  Service: Pulmonary;;   EYE SURGERY Right 11/2018   implant   JOINT REPLACEMENT Bilateral    Total Knee Dr Noemi Chapel   NECK SURGERY     Remove a tumor   TYMPANOSTOMY     Dr. Thornell Mule   VIDEO BRONCHOSCOPY WITH RADIAL ENDOBRONCHIAL ULTRASOUND  08/16/2022   Procedure: VIDEO BRONCHOSCOPY WITH RADIAL ENDOBRONCHIAL ULTRASOUND;  Surgeon: Maryjane Hurter, MD;  Location: [facility] ENDOSCOPY;  Service: Pulmonary;;    REVIEW OF SYSTEMS:  A comprehensive review of systems was negative except for: Constitutional: positive for fatigue Respiratory: positive for dyspnea on exertion Neurological: positive for paresthesia   PHYSICAL EXAMINATION: General appearance: alert, cooperative, appears stated age, fatigued, and no distress Head: Normocephalic, without obvious abnormality, atraumatic Neck: no adenopathy, no JVD, supple, symmetrical, trachea midline, and thyroid not enlarged, symmetric, no tenderness/mass/nodules Lymph nodes: Cervical, supraclavicular, and axillary nodes normal. Resp: clear to auscultation bilaterally Back: symmetric, no curvature. ROM normal. No CVA tenderness. Cardio: regular rate and rhythm, S1, S2 normal, no murmur, click, rub or gallop GI: soft, non-tender; bowel sounds normal; no masses,  no organomegaly Extremities: extremities normal, atraumatic, no cyanosis or edema  ECOG PERFORMANCE STATUS: 1 - Symptomatic but completely ambulatory  Blood pressure (!) 156/88, pulse 86, temperature 98.2 F (36.8 C), temperature source Oral, resp. rate 18, weight 224 lb 12.8 oz (102 kg), SpO2 94 %.  LABORATORY DATA: Lab Results  Component Value Date   WBC 6.7 01/22/2023   HGB 11.8 (L) 01/22/2023   HCT  36.7 01/22/2023   MCV 96.6 01/22/2023   PLT 234 01/22/2023      Chemistry      Component Value Date/Time   NA 138 09/11/2022 1343   K 4.9 09/11/2022 1343   CL 107 09/11/2022 1343   CO2 22 09/11/2022 1343   BUN 52 (H) 09/11/2022 1343   CREATININE 2.24 (H) 09/11/2022 1343   CREATININE 0.90 11/23/2015 1056      Component Value Date/Time   CALCIUM 9.9 09/11/2022 1343   CALCIUM 9.3 05/21/2022 0725   ALKPHOS 64 09/11/2022 1343   AST 23 09/11/2022 1343   ALT 35 09/11/2022 1343   BILITOT 0.3 09/11/2022 1343       RADIOGRAPHIC STUDIES: CT Chest Wo Contrast  Result Date: 01/18/2023 CLINICAL DATA:  Non-small cell lung cancer staging evaluation in a 78 year old female. Patient is post radiotherapy for RIGHT upper lobe mass. * Tracking Code: BO * EXAM: CT CHEST WITHOUT CONTRAST TECHNIQUE: Multidetector CT imaging of the chest was performed following the standard protocol  without IV contrast. RADIATION DOSE REDUCTION: This exam was performed according to the departmental dose-optimization program which includes automated exposure control, adjustment of the mA and/or kV according to patient size and/or use of iterative reconstruction technique. COMPARISON:  July 26, 2022 FINDINGS: Cardiovascular: Calcified aortic atherosclerotic changes. Calcified coronary artery disease. Calcified mitral valve annulus. No change in the noncontrast appearance of the heart great vessels. Mediastinum/Nodes: Very slight interval enlargement of RIGHT paratracheal lymph node still with partial fatty hilum 12 mm as compared to 10 mm on the prior study. No thoracic inlet lymphadenopathy. No axillary lymphadenopathy. No gross hilar adenopathy. Lungs/Pleura: Interval development ground-glass and septal thickening surrounding the cystic mass in the RIGHT upper lobe. Mass now very thin walled previously showing thick irregular walls along the inferior margin. Size diminished as well compared to previous imaging 3.2 as compared  to 3.6 cm greatest axial dimension. Patchy opacities and ground-glass favored to represent pneumonitis following recent radiotherapy in the chest. There are some areas of scattered nodularity amidst these changes which remain nonspecific given recent therapy, LEFT lung is clear. Airways are patent. Upper Abdomen: No acute upper abdominal process to the extent evaluated. Masslike appearance of the LEFT adrenal shows low-density not shown to be focally hypermetabolic on previous PET felt to represent adenoma in without change. Only minimally changed as far back as 2013 and requiring no dedicated follow-up. Musculoskeletal: No acute musculoskeletal process. No destructive bone finding. IMPRESSION: 1. Decreased size of an resolution of soft tissue associated with the cystic mass in the RIGHT upper lobe compatible with response to therapy. 2. Patchy areas of opacity, nodularity and ground-glass in the RIGHT chest favored to represent post treatment changes. Close attention on follow-up is suggested, dominant area of nodularity is noted in the superior segment of the RIGHT upper lobe. 3. Very slight interval enlargement of RIGHT paratracheal lymph node still with partial fatty hilum 12 mm as compared to 10 mm on the prior study. This could be reactive but warrants close attention on follow-up. 4. Aortic atherosclerosis and calcified coronary artery disease. Aortic Atherosclerosis (ICD10-I70.0). Electronically Signed   By: Zetta Bills M.D.   On: 01/18/2023 21:26    ASSESSMENT AND PLAN: This is a very pleasant 77 years old African-American female with a stage IIa (T2b, N0, M0) non-small cell lung cancer, squamous cell carcinoma presented with cavitary right upper lobe lung mass diagnosed in September 2023.  She has no actionable mutations and PD-L1 expression was 5%. She is status post SBRT to the right upper lobe lung nodule under the care of Dr. Lisbeth Renshaw completed on November 22, 2022. The patient is currently on  observation and she is feeling fine with no concerning complaints except for the peripheral neuropathy from diabetes mellitus and mild fatigue. She had repeat CT scan of the chest performed recently.  I personally and independently reviewed the scan images and discussed the results with the patient today. Her scan showed improvement in the size of the right upper lobe lung nodule after radiation treatment. I recommended for the patient to continue on observation with repeat CT scan of the chest in 6 months. The patient was advised to call immediately if she has any other concerning symptoms in the interval. The patient voices understanding of current disease status and treatment options and is in agreement with the current care plan.  All questions were answered. The patient knows to call the clinic with any problems, questions or concerns. We can certainly see the patient much sooner  if necessary.  The total time spent in the appointment was 20 minutes.  Disclaimer: This note was dictated with voice recognition software. Similar sounding words can inadvertently be transcribed and may not be corrected upon review.

## 2023-02-08 ENCOUNTER — Other Ambulatory Visit: Payer: Self-pay | Admitting: Family Medicine

## 2023-02-18 ENCOUNTER — Other Ambulatory Visit: Payer: Self-pay | Admitting: Family Medicine

## 2023-02-20 ENCOUNTER — Other Ambulatory Visit: Payer: Self-pay | Admitting: Family Medicine

## 2023-02-21 DIAGNOSIS — Z961 Presence of intraocular lens: Secondary | ICD-10-CM | POA: Diagnosis not present

## 2023-02-21 DIAGNOSIS — E119 Type 2 diabetes mellitus without complications: Secondary | ICD-10-CM | POA: Diagnosis not present

## 2023-02-21 DIAGNOSIS — H04123 Dry eye syndrome of bilateral lacrimal glands: Secondary | ICD-10-CM | POA: Diagnosis not present

## 2023-02-21 LAB — HM DIABETES EYE EXAM

## 2023-02-21 MED ORDER — METFORMIN HCL 500 MG PO TABS: 500.0000 mg | ORAL_TABLET | Freq: Two times a day (BID) | ORAL | 0 refills | Status: DC

## 2023-02-25 ENCOUNTER — Other Ambulatory Visit: Payer: Self-pay | Admitting: Family Medicine

## 2023-03-11 ENCOUNTER — Other Ambulatory Visit: Payer: Self-pay | Admitting: Family Medicine

## 2023-06-03 ENCOUNTER — Other Ambulatory Visit: Payer: Self-pay | Admitting: Family Medicine

## 2023-06-04 MED ORDER — LISINOPRIL 10 MG PO TABS: 20.0000 mg | ORAL_TABLET | Freq: Every day | ORAL | 0 refills | Status: DC

## 2023-06-04 MED ORDER — DICLOFENAC SODIUM 75 MG PO TBEC: 75.0000 mg | DELAYED_RELEASE_TABLET | Freq: Two times a day (BID) | ORAL | 0 refills | Status: DC

## 2023-06-04 MED ORDER — GLIPIZIDE 10 MG PO TABS: 10.0000 mg | ORAL_TABLET | Freq: Two times a day (BID) | ORAL | 0 refills | Status: DC

## 2023-06-05 ENCOUNTER — Other Ambulatory Visit: Payer: Self-pay | Admitting: Family Medicine

## 2023-06-06 MED ORDER — METFORMIN HCL 500 MG PO TABS: 500.0000 mg | ORAL_TABLET | Freq: Two times a day (BID) | ORAL | 3 refills | Status: DC

## 2023-06-10 ENCOUNTER — Ambulatory Visit: Payer: Medicare HMO | Admitting: Family Medicine

## 2023-06-10 ENCOUNTER — Encounter: Payer: Self-pay | Admitting: Family Medicine

## 2023-06-10 VITALS — BP 142/80 | HR 87 | Temp 98.3°F | Ht 64.0 in | Wt 226.0 lb

## 2023-06-10 DIAGNOSIS — E782 Mixed hyperlipidemia: Secondary | ICD-10-CM

## 2023-06-10 DIAGNOSIS — R600 Localized edema: Secondary | ICD-10-CM

## 2023-06-10 DIAGNOSIS — I10 Essential (primary) hypertension: Secondary | ICD-10-CM | POA: Diagnosis not present

## 2023-06-10 DIAGNOSIS — E1140 Type 2 diabetes mellitus with diabetic neuropathy, unspecified: Secondary | ICD-10-CM

## 2023-06-10 DIAGNOSIS — I636 Cerebral infarction due to cerebral venous thrombosis, nonpyogenic: Secondary | ICD-10-CM

## 2023-06-10 DIAGNOSIS — Z23 Encounter for immunization: Secondary | ICD-10-CM

## 2023-06-10 LAB — HEPATIC FUNCTION PANEL
ALT: 19 U/L (ref 0–35)
AST: 19 U/L (ref 0–37)
Albumin: 4.2 g/dL (ref 3.5–5.2)
Alkaline Phosphatase: 73 U/L (ref 39–117)
Bilirubin, Direct: 0.1 mg/dL (ref 0.0–0.3)
Total Bilirubin: 0.4 mg/dL (ref 0.2–1.2)
Total Protein: 8.2 g/dL (ref 6.0–8.3)

## 2023-06-10 LAB — BASIC METABOLIC PANEL
BUN: 30 mg/dL — ABNORMAL HIGH (ref 6–23)
CO2: 24 mEq/L (ref 19–32)
Calcium: 10.9 mg/dL — ABNORMAL HIGH (ref 8.4–10.5)
Chloride: 108 mEq/L (ref 96–112)
Creatinine, Ser: 1.41 mg/dL — ABNORMAL HIGH (ref 0.40–1.20)
GFR: 36.24 mL/min — ABNORMAL LOW (ref >60.00)
Glucose, Bld: 66 mg/dL — ABNORMAL LOW (ref 70–99)
Potassium: 4.2 mEq/L (ref 3.5–5.1)
Sodium: 143 mEq/L (ref 135–145)

## 2023-06-10 LAB — CBC WITH DIFFERENTIAL/PLATELET
Basophils Absolute: 0 10*3/uL (ref 0.0–0.1)
Basophils Relative: 0.8 % (ref 0.0–3.0)
Eosinophils Absolute: 0.3 10*3/uL (ref 0.0–0.7)
Eosinophils Relative: 4 % (ref 0.0–5.0)
HCT: 40.8 % (ref 36.0–46.0)
Hemoglobin: 13.1 g/dL (ref 12.0–15.0)
Lymphocytes Relative: 29.9 % (ref 12.0–46.0)
Lymphs Abs: 1.9 10*3/uL (ref 0.7–4.0)
MCHC: 32 g/dL (ref 30.0–36.0)
MCV: 97 fl (ref 78.0–100.0)
Monocytes Absolute: 0.7 10*3/uL (ref 0.1–1.0)
Monocytes Relative: 11.3 % (ref 3.0–12.0)
Neutro Abs: 3.5 10*3/uL (ref 1.4–7.7)
Neutrophils Relative %: 54 % (ref 43.0–77.0)
Platelets: 258 10*3/uL (ref 150.0–400.0)
RBC: 4.21 Mil/uL (ref 3.87–5.11)
RDW: 16.3 % — ABNORMAL HIGH (ref 11.5–15.5)
WBC: 6.4 10*3/uL (ref 4.0–10.5)

## 2023-06-10 LAB — LIPID PANEL
Cholesterol: 309 mg/dL — ABNORMAL HIGH (ref 0–200)
HDL: 61.3 mg/dL (ref >39.00)
LDL Cholesterol: 210 mg/dL — ABNORMAL HIGH (ref 0–99)
NonHDL: 248.17
Total CHOL/HDL Ratio: 5
Triglycerides: 191 mg/dL — ABNORMAL HIGH (ref 0.0–149.0)
VLDL: 38.2 mg/dL (ref 0.0–40.0)

## 2023-06-10 LAB — TSH: TSH: 1.23 u[IU]/mL (ref 0.35–5.50)

## 2023-06-10 LAB — HEMOGLOBIN A1C: Hgb A1c MFr Bld: 7.1 % — ABNORMAL HIGH (ref 4.6–6.5)

## 2023-06-10 MED ORDER — EMPAGLIFLOZIN 25 MG PO TABS: 25.0000 mg | ORAL_TABLET | Freq: Every day | ORAL | 3 refills | Status: DC

## 2023-06-10 MED ORDER — GLIPIZIDE 10 MG PO TABS: 10.0000 mg | ORAL_TABLET | Freq: Two times a day (BID) | ORAL | 3 refills | Status: DC

## 2023-06-10 MED ORDER — AMLODIPINE BESYLATE 5 MG PO TABS: 5.0000 mg | ORAL_TABLET | Freq: Every day | ORAL | 3 refills | Status: DC

## 2023-06-10 MED ORDER — LISINOPRIL 10 MG PO TABS: 20.0000 mg | ORAL_TABLET | Freq: Every day | ORAL | 3 refills | Status: DC

## 2023-06-10 MED ORDER — SPIRONOLACTONE 25 MG PO TABS: 25.0000 mg | ORAL_TABLET | Freq: Every day | ORAL | 3 refills | Status: DC

## 2023-06-10 MED ORDER — METOPROLOL SUCCINATE ER 50 MG PO TB24: 50.0000 mg | ORAL_TABLET | Freq: Every day | ORAL | 3 refills | Status: DC

## 2023-06-10 MED ORDER — ROSUVASTATIN CALCIUM 40 MG PO TABS: 40.0000 mg | ORAL_TABLET | Freq: Every day | ORAL | 3 refills | Status: DC

## 2023-06-10 MED ORDER — FUROSEMIDE 40 MG PO TABS
40.0000 mg | ORAL_TABLET | Freq: Every day | ORAL | 3 refills | Status: DC
Start: 2023-06-10 — End: 2024-08-31

## 2023-06-10 NOTE — Progress Notes (Signed)
Subjective:    Patient ID: [name], female    DOB: 11-16-1946, 77 y.o.   MRN: [id number]  HPI Here to follow up on issues. She saw Dr. Arbutus Ped on 01-22-23, and he said there is no residual sign of lung cancer, so they agreed to simply monitor this. She will see him again on 07-24-23. She has been taking Lasix 20 mg daily for leg edema, but this has gotten worse. There is no leg pain. No SOB. Her BP has been stable. She watches what she eats, but she admits to rarely checking her glucoses.    Review of Systems  Constitutional: Negative.   HENT: Negative.    Eyes: Negative.   Respiratory: Negative.    Cardiovascular:  Positive for leg swelling.  Gastrointestinal: Negative.   Genitourinary:  Negative for decreased urine volume, difficulty urinating, dyspareunia, dysuria, enuresis, flank pain, frequency, hematuria, pelvic pain and urgency.  Musculoskeletal: Negative.   Skin: Negative.   Neurological: Negative.  Negative for headaches.  Psychiatric/Behavioral: Negative.         Objective:   Physical Exam Constitutional:      General: She is not in acute distress.    Appearance: She is well-developed. She is obese.  HENT:     Head: Normocephalic and atraumatic.     Right Ear: External ear normal.     Left Ear: External ear normal.     Nose: Nose normal.     Mouth/Throat:     Pharynx: No oropharyngeal exudate.  Eyes:     General: No scleral icterus.    Conjunctiva/sclera: Conjunctivae normal.     Pupils: Pupils are equal, round, and reactive to light.  Neck:     Thyroid: No thyromegaly.     Vascular: No JVD.  Cardiovascular:     Rate and Rhythm: Normal rate and regular rhythm.     Pulses: Normal pulses.     Heart sounds: Normal heart sounds. No murmur heard.    No friction rub. No gallop.  Pulmonary:     Effort: Pulmonary effort is normal. No respiratory distress.     Breath sounds: Normal breath sounds. No wheezing or rales.  Chest:     Chest wall: No tenderness.   Abdominal:     General: Bowel sounds are normal. There is no distension.     Palpations: Abdomen is soft. There is no mass.     Tenderness: There is no abdominal tenderness. There is no guarding or rebound.  Musculoskeletal:        General: No tenderness. Normal range of motion.     Cervical back: Normal range of motion and neck supple.     Comments: 4+ edema in both lower legs   Lymphadenopathy:     Cervical: No cervical adenopathy.  Skin:    General: Skin is warm and dry.     Findings: No erythema or rash.  Neurological:     General: No focal deficit present.     Mental Status: She is alert and oriented to person, place, and time.     Cranial Nerves: No cranial nerve deficit.     Motor: No abnormal muscle tone.     Coordination: Coordination normal.     Deep Tendon Reflexes: Reflexes are normal and symmetric. Reflexes normal.  Psychiatric:        Mood and Affect: Mood normal.        Behavior: Behavior normal.        Thought Content: Thought content  normal.        Judgment: Judgment normal.           Assessment & Plan:  Her lung cancer appears to be remission at this point. She will see Dr. Arbutus Ped again next month. Her HTN is stable. For the leg edema, we will increase the Lasix to 40 mg daily. Get fasting labs for lipids, an A1c, renal function, etc. We spent a total of ( 35  ) minutes reviewing records and discussing these issues.  Gershon Crane, MD

## 2023-06-12 NOTE — Progress Notes (Signed)
Pt mailbox is full not able to leave a message for pt, will try again later

## 2023-06-26 ENCOUNTER — Encounter (INDEPENDENT_AMBULATORY_CARE_PROVIDER_SITE_OTHER): Payer: Self-pay

## 2023-07-18 ENCOUNTER — Encounter: Payer: Self-pay | Admitting: Family Medicine

## 2023-07-18 ENCOUNTER — Telehealth (INDEPENDENT_AMBULATORY_CARE_PROVIDER_SITE_OTHER): Payer: Medicare HMO | Admitting: Family Medicine

## 2023-07-18 VITALS — Ht 64.0 in | Wt 226.0 lb

## 2023-07-18 DIAGNOSIS — Z0000 Encounter for general adult medical examination without abnormal findings: Secondary | ICD-10-CM

## 2023-07-18 NOTE — Patient Instructions (Signed)
I really enjoyed getting to talk with you today! I am available on Tuesdays and Thursdays for virtual visits if you have any questions or concerns, or if I can be of any further assistance.   CHECKLIST FROM ANNUAL WELLNESS VISIT:  -Follow up (please call to schedule if not scheduled after visit):   -yearly for annual wellness visit with primary care office  Here is a list of your preventive care/health maintenance measures and the plan for each if any are due:  PLAN For any measures below that may be due:  -can get the updated covid and flu vaccines and the shingrix vaccine at the pharmacy, let us know when you do so that we can update your record   Health Maintenance  Topic Date Due   Zoster Vaccines- Shingrix (1 of 2) Never done   Diabetic kidney evaluation - Urine ACR  10/12/2015   FOOT EXAM  12/27/2018   COVID-19 Vaccine (3 - Pfizer risk series) 03/23/2020   INFLUENZA VACCINE  06/27/2023   MAMMOGRAM  07/17/2024 (Originally 10/12/2022)   HEMOGLOBIN A1C  12/11/2023   OPHTHALMOLOGY EXAM  02/21/2024   Diabetic kidney evaluation - eGFR measurement  06/09/2024   Medicare Annual Wellness (AWV)  07/17/2024   DTaP/Tdap/Td (3 - Td or Tdap) 07/17/2027   Pneumonia Vaccine 8+ Years old  Completed   DEXA SCAN  Completed   Hepatitis C Screening  Completed   HPV VACCINES  Aged Out   Colonoscopy  Discontinued    -See a dentist at least yearly  -Get your eyes checked and then per your eye specialist's recommendations  -Other issues addressed today:   -I have included below further information regarding a healthy whole foods based diet, physical activity guidelines for adults, stress management and opportunities for social connections. I hope you find this information useful.    -----------------------------------------------------------------------------------------------------------------------------------------------------------------------------------------------------------------------------------------------------------  NUTRITION: -eat real food: lots of colorful vegetables (half the plate) and fruits -5-7 servings of vegetables and fruits per day (fresh or steamed is best), exp. 2 servings of vegetables with lunch and dinner and 2 servings of fruit per day. Berries and greens such as kale and collards are great choices.  -consume on a regular basis: whole grains (make sure first ingredient on label contains the word "whole"), fresh fruits, fish, nuts, seeds, healthy oils (such as olive oil, avocado oil, grape seed oil) -may eat small amounts of dairy and lean meat on occasion, but avoid processed meats such as ham, bacon, lunch meat, etc. -drink water -try to avoid fast food and pre-packaged foods, processed meat -most experts advise limiting sodium to < 2300mg  per day, should limit further is any chronic conditions such as high blood pressure, heart disease, diabetes, etc. The American Heart Association advised that < 1500mg  is is ideal -try to avoid foods that contain any ingredients with names you do not recognize  -try to avoid sugar/sweets (except for the natural sugar that occurs in fresh fruit) -try to avoid sweet drinks -try to avoid white rice, white bread, pasta (unless whole grain), white or yellow potatoes  EXERCISE GUIDELINES FOR ADULTS: -if you wish to increase your physical activity, do so gradually and with the approval of your doctor -STOP and seek medical care immediately if you have any chest pain, chest discomfort or trouble breathing when starting or increasing exercise  -move and stretch your body, legs, feet and arms when sitting for long periods -Physical activity guidelines for optimal health in adults: -least 150 minutes per week of  aerobic exercise (can talk, but not sing) once approved by your doctor, 20-30 minutes of sustained activity or two 10 minute episodes of sustained activity every day.  -resistance training at least 2 days per week if approved by your doctor -balance exercises 3+ days per week:   Stand somewhere where you have something sturdy to hold onto if you lose balance.    1) lift up on toes, start with 5x per day and work up to 20x   2) stand and lift on leg straight out to the side so that foot is a few inches of the floor, start with 5x each side and work up to 20x each side   3) stand on one foot, start with 5 seconds each side and work up to 20 seconds on each side  If you need ideas or help with getting more active:  -Silver sneakers https://tools.silversneakers.com  -Walk with a Doc: http://www.duncan-williams.com/  -try to include resistance (weight lifting/strength building) and balance exercises twice per week: or the following link for ideas: http://castillo-powell.com/  BuyDucts.dk  STRESS MANAGEMENT: -can try meditating, or just sitting quietly with deep breathing while intentionally relaxing all parts of your body for 5 minutes daily -if you need further help with stress, anxiety or depression please follow up with your primary doctor or contact the wonderful folks at [facility]: [phone]  SOCIAL CONNECTIONS: -options in [place] if you wish to engage in more social and exercise related activities:  -Silver sneakers https://tools.silversneakers.com  -Walk with a Doc: http://www.duncan-williams.com/  -Check out the [facility] Active Adults 50+ section on the [place] [employer] (hiking clubs, book clubs, cards and games, chess, exercise classes, aquatic classes and much more) - see the website for  details: https://www.[place].gov/departments/parks-recreation/active-adults50  -YouTube has lots of exercise videos for different ages and abilities as well  -[facility] (a variety of indoor and outdoor inperson activities for adults). [phone]. [address].  -Virtual Online Classes (a variety of topics): see seniorplanet.org or call [phone]  -consider volunteering at a school, hospice center, church, senior center or elsewhere         ADVANCED HEALTHCARE DIRECTIVES:  Everyone should have advanced health care directives in place. This is so that you get the care you want, should you ever be in a situation where you are unable to make your own medical decisions.   From the [place] Advanced Directive Website: "Advance Health Care Directives are legal documents in which you give written instructions about your health care if, in the future, you cannot speak for yourself.   A health care power of attorney allows you to name a person you trust to make your health care decisions if you cannot make them yourself. A declaration of a desire for a natural death (or living will) is document, which states that you desire not to have your life prolonged by extraordinary measures if you have a terminal or incurable illness or if you are in a vegetative state. An advance instruction for mental health treatment makes a declaration of instructions, information and preferences regarding your mental health treatment. It also states that you are aware that the advance instruction authorizes a mental health treatment provider to act according to your wishes. It may also outline your consent or refusal of mental health treatment. A declaration of an anatomical gift allows anyone over the age of 49 to make a gift by will, organ donor card or other document."   Please see the following website  or an elder Sales executive for forms, FAQs and for completion of advanced  directives: [place] TEFL teacher Health Care Directives Advance Health Care Directives (http://guzman.com/)  Or copy and paste the following to your web browser: PoshChat.fi

## 2023-07-18 NOTE — Progress Notes (Signed)
PATIENT CHECK-IN and HEALTH RISK ASSESSMENT QUESTIONNAIRE:  -completed by phone/video for upcoming Medicare Preventive Visit  Pre-Visit Check-in: 1)Vitals (height, wt, BP, etc) - record in vitals section for visit on day of visit Request home vitals (wt, BP, etc.) and enter into vitals, THEN update Vital Signs SmartPhrase below at the top of the HPI. See below.  2)Review and Update Medications, Allergies PMH, Surgeries, Social history in Epic 3)Hospitalizations in the last year with date/reason? No  4)Review and Update Care Team (patient's specialists) in Epic 5) Complete PHQ9 in Epic  6) Complete Fall Screening in Epic 7)Review all Health Maintenance Due and order under PCP if not done.  8)Medicare Wellness Questionnaire: Answer theses question about your habits: Do you drink alcohol? No Have you ever smoked?Yes Quit date if applicable? 1 year ago  How many packs a day do/did you smoke? 1/2  Do you use smokeless tobacco?No Do you use an illicit drugs? No Do you exercises? No  Are you sexually active? No Number of partners?N/A Typical breakfast: Hash Deason and applesauce, Gritts Typical lunch: Salad, Sandwich, Apple, Chicken Typical dinner: Chicken, Sandwiches  Typical snacks: Popcorn and Chips  Beverages: Water  Answer theses question about you: Can you perform most household chores? Yes  Do you find it hard to follow a conversation in a noisy room? No Do you often ask people to speak up or repeat themselves?No Do you feel that you have a problem with memory? No Do you balance your checkbook and or bank acounts? Yes Do you feel safe at home? Yes Last dentist visit? N/A. No teeth Do you need assistance with any of the following: Please note if so No  Driving?  Feeding yourself?  Getting from bed to chair?  Getting to the toilet?  Bathing or showering?  Dressing yourself?  Managing money?  Climbing a flight of stairs  Preparing meals?  Do you have Advanced Directives in  place (Living Will, Healthcare Power or Attorney)?  No   Last eye Exam and location? 6 months ago, [facility]    Do you currently use prescribed or non-prescribed narcotic or opioid pain medications?No  Do you have a history or close family history of breast, ovarian, tubal or peritoneal cancer or a family member with BRCA (breast cancer susceptibility 1 and 2) gene mutations? No  Request home vitals (wt, BP, etc.) and enter into vitals, THEN update Vital Signs SmartPhrase below at the top of the HPI. See below.   Nurse/Assistant Credentials/time stamp: MG 10:24   ----------------------------------------------------------------------------------------------------------------------------------------------------------------------------------------------------------------------  Vital Signs: no vitals reported for this visit due to telehealth visit.    MEDICARE ANNUAL PREVENTIVE VISIT WITH PROVIDER: (Welcome to Medicare, initial annual wellness or annual wellness exam)  Virtual Visit via Phone Note  I connected with [name] on 07/18/23 by phone  and verified that I am speaking with the correct person using two identifiers.  Location patient: home Location provider:work or home office Persons participating in the virtual visit: patient, provider  Concerns and/or follow up today: none, reports stable. Seeing Oncology tomorrow for follow up.    See HM section in Epic for other details of completed HM.    ROS: negative for report of fevers, unintentional weight loss, vision changes, vision loss, hearing loss or change, chest pain, sob, hemoptysis, melena, hematochezia, hematuria, falls, bleeding or bruising, thoughts of suicide or self harm, memory loss  Patient-completed extensive health risk assessment - reviewed and discussed with the patient: See Health Risk Assessment  completed with patient prior to the visit either above or in recent phone note. This was reviewed in  detailed with the patient today and appropriate recommendations, orders and referrals were placed as needed per Summary below and patient instructions.   Review of Medical History: -PMH, PSH, Family History and current specialty and care providers reviewed and updated and listed below   Patient Care Team: Nelwyn Salisbury, MD as PCP - [facility]   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 ENDOSCOPY;  Service: Pulmonary;;   BRONCHIAL NEEDLE ASPIRATION BIOPSY  08/16/2022   Procedure: BRONCHIAL NEEDLE ASPIRATION BIOPSIES;  Surgeon: Omar Person, MD;  Location: [facility] ENDOSCOPY;  Service: Pulmonary;;   COLONOSCOPY  07/26/2020   per Dr. Myrtie Neither, no polyps, no repeats   ENDOBRONCHIAL ULTRASOUND  08/16/2022   Procedure: ENDOBRONCHIAL ULTRASOUND;  Surgeon: Omar Person, MD;  Location: [facility] ENDOSCOPY;  Service: Pulmonary;;   EYE SURGERY Right 11/2018   implant   JOINT REPLACEMENT Bilateral    Total Knee Dr Thurston Hole   NECK SURGERY     Remove a tumor   TYMPANOSTOMY     Dr. 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  Tobacco Use   Smoking status: Former    Current packs/day: 0.50    Average packs/day: 0.5 packs/day for 54.6 years (27.3 ttl pk-yrs)    Types: Cigarettes    Start date: 11/26/1968   Smokeless tobacco: Never   Tobacco comments:    trying to quit;  not really motivated at present  Vaping Use   Vaping status: Never Used  Substance and Sexual Activity   Alcohol use: Not Currently    Alcohol/week: 0.0 standard drinks of alcohol    Comment: rare   Drug use: No   Sexual activity: Never  Other Topics Concern   Not on file  Social History Narrative   01/23/2019:    Lives with "great-grandson" in one level with new dog, Brandon.    Hx raising of her deceased sister's 3 children, and is now raising/supporting 10yo  'great-grandson'.    Attends church, church community as support system. Many of her family has passed away.   Enjoys watching TV; used to enjoy going fishing         Social Determinants of Health   Financial Resource Strain: Low Risk  (07/18/2023)   Overall Financial Resource Strain (CARDIA)    Difficulty  of Paying Living Expenses: Not hard at all  Food Insecurity: No Food Insecurity (07/18/2023)   Hunger Vital Sign    Worried About Running Out of Food in the Last Year: Never true    Ran Out of Food in the Last Year: Never true  Transportation Needs: No Transportation Needs (07/18/2023)   PRAPARE - Administrator, Civil Service (Medical): No    Lack of Transportation (Non-Medical): No  Physical Activity: Inactive (07/18/2023)   Exercise Vital Sign    Days of Exercise per Week: 0 days    Minutes of Exercise per Session: 0 min  Stress: No Stress Concern Present (07/18/2023)   [employer] of Occupational Health - Occupational Stress Questionnaire    Feeling of Stress : Not at all  Social  Connections: Moderately Isolated (07/18/2023)   Social Connection and Isolation Panel [NHANES]    Frequency of Communication with Friends and Family: More than three times a week    Frequency of Social Gatherings with Friends and Family: Twice a week    Attends Religious Services: More than 4 times per year    Active Member of [employer] or Organizations: No    Attends Banker Meetings: Never    Marital Status: Widowed  Intimate Partner Violence: Not At Risk (07/18/2023)   Humiliation, Afraid, Rape, and Kick questionnaire    Fear of Current or Ex-Partner: No    Emotionally Abused: No    Physically Abused: No    Sexually Abused: No    Family History  Problem Relation Age of Onset   Heart disease Father        No details   Heart failure Mother    Diabetes Neg Hx    Colon cancer Neg Hx    Esophageal cancer Neg Hx    Rectal cancer Neg Hx    Stomach cancer Neg Hx     Current Outpatient Medications on File Prior to Visit  Medication Sig Dispense Refill   albuterol (VENTOLIN HFA) 108 (90 Base) MCG/ACT inhaler Inhale 2 puffs into the lungs every 6 (six) hours as needed for wheezing or shortness of breath. 8 g 2   Alcohol Swabs (ALCOHOL PREP) PADS Use to test 1 X day and diagnosis code is E 11.9 100 each 1   amLODipine (NORVASC) 5 MG tablet Take 1 tablet (5 mg total) by mouth daily. 90 tablet 3   aspirin 81 MG chewable tablet Chew 1 tablet (81 mg total) by mouth daily. 30 tablet 3   Blood Glucose Monitoring Suppl (ONETOUCH VERIO) w/Device KIT Use to check blood sugar two times daily 1 kit 0   diclofenac (VOLTAREN) 75 MG EC tablet Take 1 tablet (75 mg total) by mouth 2 (two) times daily. 180 tablet 0   empagliflozin (JARDIANCE) 25 MG TABS tablet Take 1 tablet (25 mg total) by mouth daily before breakfast. 90 tablet 3   furosemide (LASIX) 40 MG tablet Take 1 tablet (40 mg total) by mouth daily. 90 tablet 3   glipiZIDE (GLUCOTROL) 10 MG tablet Take 1 tablet (10 mg total) by mouth 2  (two) times daily before a meal. 180 tablet 3   Lancets (ONETOUCH ULTRASOFT) lancets USE   TO CHECK GLUCOSE ONCE DAILY 100 each 0   lisinopril (ZESTRIL) 10 MG tablet Take 2 tablets (20 mg total) by mouth daily. 90 tablet 3   metFORMIN (GLUCOPHAGE) 500 MG tablet Take 1 tablet (500 mg total) by mouth 2 (two) times daily with a meal.  180 tablet 3   metoprolol succinate (TOPROL-XL) 50 MG 24 hr tablet Take 1 tablet (50 mg total) by mouth daily. Take with or immediately following a meal. 90 tablet 3   ONETOUCH VERIO test strip USE 1 STRIP TO CHECK GLUCOSE ONCE DAILY 50 each 0   risedronate (ACTONEL) 5 MG tablet Take 5 mg by mouth every morning.     rosuvastatin (CRESTOR) 40 MG tablet Take 1 tablet (40 mg total) by mouth daily. 90 tablet 3   spironolactone (ALDACTONE) 25 MG tablet Take 1 tablet (25 mg total) by mouth daily. 90 tablet 3   No current facility-administered medications on file prior to visit.    Allergies  Allergen Reactions   Actonel [Risedronate] Other (See Comments)    It made her "feel like I'm going backwards"    Hydromet [Hydrocodone Bit-Homatrop Mbr] Itching       Physical Exam Vitals requested from patient and listed below if patient had equipment and was able to obtain at home for this virtual visit: There were no vitals filed for this visit. Estimated body mass index is 38.79 kg/m as calculated from the following:   Height as of this encounter: 5\' 4"  (1.626 m).   Weight as of this encounter: 226 lb (102.5 kg).  EKG (optional): deferred due to virtual visit  GENERAL: alert, oriented, no acute distress detected, full vision exam deferred due to pandemic and/or virtual encounter  PSYCH/NEURO: pleasant and cooperative, no obvious depression or anxiety, speech and thought processing grossly intact, Cognitive function grossly intact  Flowsheet Row Video Visit from 07/18/2023 in [facility]  PHQ-9 Total Score 0           07/18/2023    10:04 AM 07/16/2022    9:04 AM 06/01/2022    1:10 PM 03/05/2022   12:13 PM 02/06/2022   11:21 AM  Depression screen PHQ 2/9  Decreased Interest 0 0 0 0 0  Down, Depressed, Hopeless 0 0 0 0 0  PHQ - 2 Score 0 0 0 0 0  Altered sleeping 0 0 0    Tired, decreased energy 0 0 0    Change in appetite 0 0 0  1  Feeling bad or failure about yourself  0 0 0  0  Trouble concentrating 0 0 0  0  Moving slowly or fidgety/restless 0 0 0    Suicidal thoughts 0 0 0    PHQ-9 Score 0 0 0    Difficult doing work/chores Not difficult at all Not difficult at all Not difficult at all         07/16/2022    9:07 AM 08/16/2022    6:39 AM 08/28/2022    2:21 PM 10/24/2022    9:02 AM 07/18/2023   10:05 AM  Fall Risk  Falls in the past year? 1    0  Was there an injury with Fall? 0    0  Fall Risk Category Calculator 1    0  Fall Risk Category (Retired) Low      (RETIRED) Patient Fall Risk Level Low fall risk Moderate fall risk High fall risk High fall risk   Patient at Risk for Falls Due to No Fall Risks    No Fall Risks  Fall risk Follow up     Falls evaluation completed     SUMMARY AND PLAN:  Encounter for Medicare annual wellness exam   Discussed applicable health maintenance/preventive health measures and advised and referred  or ordered per patient preferences: -discussed vaccines due and she reports she will get them at the pharmacy, advised to let us know once she does so can update chart -she declined mammogram -she can get kidney exam and foot exam with PCP at next visit if she wishes- discussed Health Maintenance  Topic Date Due   Zoster Vaccines- Shingrix (1 of 2) Never done   Diabetic kidney evaluation - Urine ACR  10/12/2015   FOOT EXAM  12/27/2018   COVID-19 Vaccine (3 - Pfizer risk series) 03/23/2020   INFLUENZA VACCINE  06/27/2023   MAMMOGRAM  07/17/2024 (Originally 10/12/2022)   HEMOGLOBIN A1C  12/11/2023   OPHTHALMOLOGY EXAM  02/21/2024   Diabetic kidney evaluation - eGFR  measurement  06/09/2024   Medicare Annual Wellness (AWV)  07/17/2024   DTaP/Tdap/Td (3 - Td or Tdap) 07/17/2027   Pneumonia Vaccine 72+ Years old  Completed   DEXA SCAN  Completed   Hepatitis C Screening  Completed   HPV VACCINES  Aged Out   Colonoscopy  Discontinued     Education and counseling on the following was provided based on the above review of health and a plan/checklist for the patient, along with additional information discussed, was provided for the patient in the patient instructions :  -Advised on importance of completing advanced directives, discussed options for completing and provided information in patient instructions as well -Advised and counseled on a healthy lifestyle  -Reviewed patient's current diet. Advised and counseled on a whole foods based healthy diet. A summary of a healthy diet was provided in the Patient Instructions.  -reviewed patient's current physical activity level and discussed exercise guidelines for adults. Discussed community resources and ideas for safe exercise at home to assist in meeting exercise guideline recommendations in a safe and healthy way.  -Advise regular eye exams   Follow up: see patient instructions     Patient Instructions  I really enjoyed getting to talk with you today! I am available on Tuesdays and Thursdays for virtual visits if you have any questions or concerns, or if I can be of any further assistance.   CHECKLIST FROM ANNUAL WELLNESS VISIT:  -Follow up (please call to schedule if not scheduled after visit):   -yearly for annual wellness visit with primary care office  Here is a list of your preventive care/health maintenance measures and the plan for each if any are due:  PLAN For any measures below that may be due:  -can get the updated covid and flu vaccines and the shingrix vaccine at the pharmacy, let us know when you do so that we can update your record   Health Maintenance  Topic Date Due   Zoster  Vaccines- Shingrix (1 of 2) Never done   Diabetic kidney evaluation - Urine ACR  10/12/2015   FOOT EXAM  12/27/2018   COVID-19 Vaccine (3 - Pfizer risk series) 03/23/2020   INFLUENZA VACCINE  06/27/2023   MAMMOGRAM  07/17/2024 (Originally 10/12/2022)   HEMOGLOBIN A1C  12/11/2023   OPHTHALMOLOGY EXAM  02/21/2024   Diabetic kidney evaluation - eGFR measurement  06/09/2024   Medicare Annual Wellness (AWV)  07/17/2024   DTaP/Tdap/Td (3 - Td or Tdap) 07/17/2027   Pneumonia Vaccine 110+ Years old  Completed   DEXA SCAN  Completed   Hepatitis C Screening  Completed   HPV VACCINES  Aged Out   Colonoscopy  Discontinued    -See a dentist at least yearly  -Get your eyes checked and then per  your eye specialist's recommendations  -Other issues addressed today:   -I have included below further information regarding a healthy whole foods based diet, physical activity guidelines for adults, stress management and opportunities for social connections. I hope you find this information useful.   -----------------------------------------------------------------------------------------------------------------------------------------------------------------------------------------------------------------------------------------------------------  NUTRITION: -eat real food: lots of colorful vegetables (half the plate) and fruits -5-7 servings of vegetables and fruits per day (fresh or steamed is best), exp. 2 servings of vegetables with lunch and dinner and 2 servings of fruit per day. Berries and greens such as kale and collards are great choices.  -consume on a regular basis: whole grains (make sure first ingredient on label contains the word "whole"), fresh fruits, fish, nuts, seeds, healthy oils (such as olive oil, avocado oil, grape seed oil) -may eat small amounts of dairy and lean meat on occasion, but avoid processed meats such as ham, bacon, lunch meat, etc. -drink water -try to avoid fast food  and pre-packaged foods, processed meat -most experts advise limiting sodium to < 2300mg  per day, should limit further is any chronic conditions such as high blood pressure, heart disease, diabetes, etc. The American Heart Association advised that < 1500mg  is is ideal -try to avoid foods that contain any ingredients with names you do not recognize  -try to avoid sugar/sweets (except for the natural sugar that occurs in fresh fruit) -try to avoid sweet drinks -try to avoid white rice, white bread, pasta (unless whole grain), white or yellow potatoes  EXERCISE GUIDELINES FOR ADULTS: -if you wish to increase your physical activity, do so gradually and with the approval of your doctor -STOP and seek medical care immediately if you have any chest pain, chest discomfort or trouble breathing when starting or increasing exercise  -move and stretch your body, legs, feet and arms when sitting for long periods -Physical activity guidelines for optimal health in adults: -least 150 minutes per week of aerobic exercise (can talk, but not sing) once approved by your doctor, 20-30 minutes of sustained activity or two 10 minute episodes of sustained activity every day.  -resistance training at least 2 days per week if approved by your doctor -balance exercises 3+ days per week:   Stand somewhere where you have something sturdy to hold onto if you lose balance.    1) lift up on toes, start with 5x per day and work up to 20x   2) stand and lift on leg straight out to the side so that foot is a few inches of the floor, start with 5x each side and work up to 20x each side   3) stand on one foot, start with 5 seconds each side and work up to 20 seconds on each side  If you need ideas or help with getting more active:  -Silver sneakers https://tools.silversneakers.com  -Walk with a Doc: http://www.duncan-williams.com/  -try to include resistance (weight lifting/strength building) and balance exercises twice per week:  or the following link for ideas: http://castillo-powell.com/  BuyDucts.dk  STRESS MANAGEMENT: -can try meditating, or just sitting quietly with deep breathing while intentionally relaxing all parts of your body for 5 minutes daily -if you need further help with stress, anxiety or depression please follow up with your primary doctor or contact the wonderful folks at [facility]: [phone]  SOCIAL CONNECTIONS: -options in [place] if you wish to engage in more social and exercise related activities:  -Silver sneakers https://tools.silversneakers.com  -Walk with a Doc: http://www.duncan-williams.com/  -Check out the [facility] Active Adults 50+  section on the [place] of [employer] (hiking clubs, book clubs, cards and games, chess, exercise classes, aquatic classes and much more) - see the website for details: https://www.[place].gov/departments/parks-recreation/active-adults50  -YouTube has lots of exercise videos for different ages and abilities as well  -[facility] (a variety of indoor and outdoor inperson activities for adults). [phone]. [address].  -Virtual Online Classes (a variety of topics): see seniorplanet.org or call [phone]  -consider volunteering at a school, hospice center, church, senior center or elsewhere         ADVANCED HEALTHCARE DIRECTIVES:  Everyone should have advanced health care directives in place. This is so that you get the care you want, should you ever be in a situation where you are unable to make your own medical decisions.   From the [place] Advanced Directive Website: "Advance Health Care Directives are legal documents in which you give written instructions about your health care if, in the future, you cannot speak for yourself.   A health care power of attorney allows you to name a person you trust to  make your health care decisions if you cannot make them yourself. A declaration of a desire for a natural death (or living will) is document, which states that you desire not to have your life prolonged by extraordinary measures if you have a terminal or incurable illness or if you are in a vegetative state. An advance instruction for mental health treatment makes a declaration of instructions, information and preferences regarding your mental health treatment. It also states that you are aware that the advance instruction authorizes a mental health treatment provider to act according to your wishes. It may also outline your consent or refusal of mental health treatment. A declaration of an anatomical gift allows anyone over the age of 60 to make a gift by will, organ donor card or other document."   Please see the following website or an elder law attorney for forms, FAQs and for completion of advanced directives: [place] TEFL teacher Health Care Directives Advance Health Care Directives (http://guzman.com/)  Or copy and paste the following to your web browser: PoshChat.fi    Terressa Koyanagi, DO

## 2023-07-18 NOTE — Progress Notes (Signed)
 Patient unable to obtain vitals due to telehealth visit

## 2023-07-19 ENCOUNTER — Ambulatory Visit (HOSPITAL_COMMUNITY)
Admission: RE | Admit: 2023-07-19 | Discharge: 2023-07-19 | Disposition: A | Discharge status: Home / Self Care | Payer: Medicare HMO | Source: Ambulatory Visit | Attending: Internal Medicine | Admitting: Internal Medicine

## 2023-07-19 ENCOUNTER — Inpatient Hospital Stay: Payer: Medicare HMO | Attending: Internal Medicine

## 2023-07-19 DIAGNOSIS — Z08 Encounter for follow-up examination after completed treatment for malignant neoplasm: Secondary | ICD-10-CM | POA: Insufficient documentation

## 2023-07-19 DIAGNOSIS — C3490 Malignant neoplasm of unspecified part of unspecified bronchus or lung: Secondary | ICD-10-CM | POA: Diagnosis not present

## 2023-07-19 DIAGNOSIS — I700 Atherosclerosis of aorta: Secondary | ICD-10-CM | POA: Diagnosis not present

## 2023-07-19 DIAGNOSIS — Z85118 Personal history of other malignant neoplasm of bronchus and lung: Secondary | ICD-10-CM | POA: Insufficient documentation

## 2023-07-19 LAB — CBC WITH DIFFERENTIAL (CANCER CENTER ONLY)
Abs Immature Granulocytes: 0.07 10*3/uL (ref 0.00–0.07)
Basophils Absolute: 0.1 10*3/uL (ref 0.0–0.1)
Basophils Relative: 1 %
Eosinophils Absolute: 0.2 10*3/uL (ref 0.0–0.5)
Eosinophils Relative: 4 %
HCT: 41.5 % (ref 36.0–46.0)
Hemoglobin: 13.2 g/dL (ref 12.0–15.0)
Immature Granulocytes: 1 %
Lymphocytes Relative: 27 %
Lymphs Abs: 1.8 10*3/uL (ref 0.7–4.0)
MCH: 30.8 pg (ref 26.0–34.0)
MCHC: 31.8 g/dL (ref 30.0–36.0)
MCV: 97 fL (ref 80.0–100.0)
Monocytes Absolute: 0.7 10*3/uL (ref 0.1–1.0)
Monocytes Relative: 11 %
Neutro Abs: 3.9 10*3/uL (ref 1.7–7.7)
Neutrophils Relative %: 56 %
Platelet Count: 273 10*3/uL (ref 150–400)
RBC: 4.28 MIL/uL (ref 3.87–5.11)
RDW: 15.5 % (ref 11.5–15.5)
WBC Count: 6.7 10*3/uL (ref 4.0–10.5)
nRBC: 0 % (ref 0.0–0.2)

## 2023-07-19 LAB — CMP (CANCER CENTER ONLY)
ALT: 24 U/L (ref 0–44)
AST: 16 U/L (ref 15–41)
Albumin: 4.1 g/dL (ref 3.5–5.0)
Alkaline Phosphatase: 71 U/L (ref 38–126)
Anion gap: 9 (ref 5–15)
BUN: 66 mg/dL — ABNORMAL HIGH (ref 8–23)
CO2: 21 mmol/L — ABNORMAL LOW (ref 22–32)
Calcium: 10.6 mg/dL — ABNORMAL HIGH (ref 8.9–10.3)
Chloride: 109 mmol/L (ref 98–111)
Creatinine: 2.09 mg/dL — ABNORMAL HIGH (ref 0.44–1.00)
GFR, Estimated: 24 mL/min — ABNORMAL LOW (ref >60)
Glucose, Bld: 151 mg/dL — ABNORMAL HIGH (ref 70–99)
Potassium: 5.7 mmol/L — ABNORMAL HIGH (ref 3.5–5.1)
Sodium: 139 mmol/L (ref 135–145)
Total Bilirubin: 0.3 mg/dL (ref 0.3–1.2)
Total Protein: 8.1 g/dL (ref 6.5–8.1)

## 2023-07-22 ENCOUNTER — Ambulatory Visit: Payer: 59 | Admitting: Internal Medicine

## 2023-07-24 ENCOUNTER — Inpatient Hospital Stay (HOSPITAL_BASED_OUTPATIENT_CLINIC_OR_DEPARTMENT_OTHER): Payer: Medicare HMO | Admitting: Internal Medicine

## 2023-07-24 VITALS — BP 136/77 | HR 70 | Temp 97.5°F | Resp 18 | Ht 64.0 in | Wt 214.0 lb

## 2023-07-24 DIAGNOSIS — Z08 Encounter for follow-up examination after completed treatment for malignant neoplasm: Secondary | ICD-10-CM | POA: Diagnosis not present

## 2023-07-24 DIAGNOSIS — C3490 Malignant neoplasm of unspecified part of unspecified bronchus or lung: Secondary | ICD-10-CM | POA: Diagnosis not present

## 2023-07-24 DIAGNOSIS — Z85118 Personal history of other malignant neoplasm of bronchus and lung: Secondary | ICD-10-CM | POA: Diagnosis not present

## 2023-07-24 NOTE — Progress Notes (Signed)
[facility] [phone]   [phone]  OFFICE PROGRESS NOTE  Nelwyn Salisbury, MD [address] [place] [address]  DIAGNOSIS:  Stage IIA  (T2b, N0, M0) non-small cell lung cancer, squamous cell carcinoma presented with cavitary right upper lobe lung mass diagnosed in September 2023.   PRIOR THERAPY: status post SBRT to the right upper lobe lung nodule under the care of Dr. Mitzi Hansen completed on November 22, 2022.  CURRENT THERAPY: Observation.  INTERVAL HISTORY: [name] 77 y.o. female returns to the clinic today for 6 months follow-up visit.  The patient is feeling fine today with no concerning complaints.  She denied having any current chest pain, shortness of breath, cough or hemoptysis.  She has no nausea, vomiting, diarrhea or constipation.  She has no headache or visual changes.  She has no recent weight loss or night sweats.  She is here today for evaluation with repeat CT scan of the chest for restaging of her disease.  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  MEDICATIONS:  Current Outpatient Medications  Medication Sig Dispense Refill   albuterol (VENTOLIN HFA) 108 (90 Base) MCG/ACT inhaler Inhale 2 puffs into the lungs every 6 (six) hours as needed for wheezing or shortness of breath. 8 g 2   Alcohol Swabs (ALCOHOL PREP) PADS Use to test 1 X day and diagnosis code is E 11.9 100 each 1   amLODipine (NORVASC) 5 MG tablet Take 1 tablet (5 mg total) by mouth daily. 90 tablet 3   aspirin 81 MG chewable tablet Chew 1 tablet (81 mg  total) by mouth daily. 30 tablet 3   Blood Glucose Monitoring Suppl (ONETOUCH VERIO) w/Device KIT Use to check blood sugar two times daily 1 kit 0   diclofenac (VOLTAREN) 75 MG EC tablet Take 1 tablet (75 mg total) by mouth 2 (two) times daily. 180 tablet 0   empagliflozin (JARDIANCE) 25 MG TABS tablet Take 1 tablet (25 mg total) by mouth daily before breakfast. 90 tablet 3   furosemide (LASIX) 40 MG tablet Take 1 tablet (40 mg total) by mouth daily. 90 tablet 3   glipiZIDE (GLUCOTROL) 10 MG tablet Take 1 tablet (10 mg total) by mouth 2 (two) times daily before a meal. 180 tablet 3   Lancets (ONETOUCH ULTRASOFT) lancets USE   TO CHECK GLUCOSE ONCE DAILY 100 each 0   lisinopril (ZESTRIL) 10 MG tablet Take 2 tablets (20 mg total) by mouth daily. 90 tablet 3   metFORMIN (GLUCOPHAGE) 500 MG tablet Take 1 tablet (500 mg total) by mouth 2 (two) times daily with a meal. 180 tablet 3   metoprolol succinate (TOPROL-XL) 50 MG 24 hr tablet Take 1 tablet (50 mg total) by mouth daily. Take with or immediately following a meal. 90 tablet 3   ONETOUCH VERIO test strip USE 1 STRIP TO CHECK GLUCOSE ONCE DAILY 50 each 0   risedronate (ACTONEL) 5 MG tablet Take 5 mg by mouth every morning.     rosuvastatin (CRESTOR) 40 MG  tablet Take 1 tablet (40 mg total) by mouth daily. 90 tablet 3   spironolactone (ALDACTONE) 25 MG tablet Take 1 tablet (25 mg total) by mouth daily. 90 tablet 3   No current facility-administered medications for this visit.    SURGICAL HISTORY:  Past Surgical History:  Procedure Laterality Date   BRONCHIAL BIOPSY  08/16/2022   Procedure: BRONCHIAL BIOPSIES;  Surgeon: Omar Person, MD;  Location: [facility] ENDOSCOPY;  Service: Pulmonary;;   BRONCHIAL BRUSHINGS  08/16/2022   Procedure: BRONCHIAL BRUSHINGS;  Surgeon: Omar Person, MD;  Location: [facility] ENDOSCOPY;  Service: Pulmonary;;   BRONCHIAL NEEDLE ASPIRATION BIOPSY  08/16/2022   Procedure: BRONCHIAL NEEDLE ASPIRATION BIOPSIES;  Surgeon:  Omar Person, MD;  Location: [facility] ENDOSCOPY;  Service: Pulmonary;;   COLONOSCOPY  07/26/2020   per Dr. Myrtie Neither, no polyps, no repeats   ENDOBRONCHIAL ULTRASOUND  08/16/2022   Procedure: ENDOBRONCHIAL ULTRASOUND;  Surgeon: Omar Person, MD;  Location: [facility] ENDOSCOPY;  Service: Pulmonary;;   EYE SURGERY Right 11/2018   implant   JOINT REPLACEMENT Bilateral    Total Knee Dr Thurston Hole   NECK SURGERY     Remove a tumor   TYMPANOSTOMY     Dr. Dorma Russell   VIDEO BRONCHOSCOPY WITH RADIAL ENDOBRONCHIAL ULTRASOUND  08/16/2022   Procedure: VIDEO BRONCHOSCOPY WITH RADIAL ENDOBRONCHIAL ULTRASOUND;  Surgeon: Omar Person, MD;  Location: [facility] ENDOSCOPY;  Service: Pulmonary;;    REVIEW OF SYSTEMS:  A comprehensive review of systems was negative except for: Musculoskeletal: positive for arthralgias   PHYSICAL EXAMINATION: General appearance: alert, cooperative, appears stated age, and no distress Head: Normocephalic, without obvious abnormality, atraumatic Neck: no adenopathy, no JVD, supple, symmetrical, trachea midline, and thyroid not enlarged, symmetric, no tenderness/mass/nodules Lymph nodes: Cervical, supraclavicular, and axillary nodes normal. Resp: clear to auscultation bilaterally Back: symmetric, no curvature. ROM normal. No CVA tenderness. Cardio: regular rate and rhythm, S1, S2 normal, no murmur, click, rub or gallop GI: soft, non-tender; bowel sounds normal; no masses,  no organomegaly Extremities: extremities normal, atraumatic, no cyanosis or edema  ECOG PERFORMANCE STATUS: 1 - Symptomatic but completely ambulatory  Blood pressure 136/77, pulse 70, temperature (!) 97.5 F (36.4 C), temperature source Oral, resp. rate 18, height 5\' 4"  (1.626 m), weight 214 lb (97.1 kg), SpO2 98%.  LABORATORY DATA: Lab Results  Component Value Date   WBC 6.7 07/19/2023   HGB 13.2 07/19/2023   HCT 41.5 07/19/2023   MCV 97.0 07/19/2023   PLT 273 07/19/2023      Chemistry      Component Value  Date/Time   NA 139 07/19/2023 1546   K 5.7 (H) 07/19/2023 1546   CL 109 07/19/2023 1546   CO2 21 (L) 07/19/2023 1546   BUN 66 (H) 07/19/2023 1546   CREATININE 2.09 (H) 07/19/2023 1546   CREATININE 0.90 11/23/2015 1056      Component Value Date/Time   CALCIUM 10.6 (H) 07/19/2023 1546   CALCIUM 9.3 05/21/2022 0725   ALKPHOS 71 07/19/2023 1546   AST 16 07/19/2023 1546   ALT 24 07/19/2023 1546   BILITOT 0.3 07/19/2023 1546       RADIOGRAPHIC STUDIES: CT Chest Wo Contrast  Result Date: 07/23/2023 CLINICAL DATA:  Non-small-cell lung cancer. Restaging. * Tracking Code: BO * EXAM: CT CHEST WITHOUT CONTRAST TECHNIQUE: Multidetector CT imaging of the chest was performed following the standard protocol without IV contrast. RADIATION DOSE REDUCTION: This exam was performed according to the departmental dose-optimization program which includes automated exposure control,  adjustment of the mA and/or kV according to patient size and/or use of iterative reconstruction technique. COMPARISON:  01/18/2023 FINDINGS: Cardiovascular: The heart size is normal. No substantial pericardial effusion. Coronary artery calcification is evident. Moderate atherosclerotic calcification is noted in the wall of the thoracic aorta. Mediastinum/Nodes: 12 mm right paratracheal node measured previously has decreased to 10 mm short axis today. No mediastinal lymphadenopathy. No evidence for gross hilar lymphadenopathy although assessment is limited by the lack of intravenous contrast on the current study. The esophagus has normal imaging features. There is no axillary lymphadenopathy. 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 No new overtly suspicious pulmonary nodule or mass. No pleural effusion. Upper Abdomen: Stable low-density left adrenal lesion suggesting adenoma. Tiny  right adrenal nodule also stable with low density suggesting adenoma. Musculoskeletal: No worrisome lytic or sclerotic osseous abnormality. IMPRESSION: 1. Interval increase in architectural distortion and band or platelike opacity in the right suprahilar region with associated volume loss. 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 Continued attention on follow-up recommended. No findings today to suggest recurrent or metastatic disease. 2. Interval decrease in size of the right paratracheal lymph node. 3. Stable low-density bilateral adrenal lesions, likely adenomas. 4.  Aortic Atherosclerosis (ICD10-I70.0). Electronically Signed   By: Kennith Center M.D.   On: 07/23/2023 14:26    ASSESSMENT AND PLAN: This is a very pleasant 77 years old African-American female with a stage IIa (T2b, N0, M0) non-small cell lung cancer, squamous cell carcinoma presented with cavitary right upper lobe lung mass diagnosed in September 2023.  She has no actionable mutations and PD-L1 expression was 5%. She is status post SBRT to the right upper lobe lung nodule under the care of Dr. Mitzi Hansen completed on November 22, 2022. The patient is currently on observation and she is feeling fine with no concerning complaints. She had repeat CT scan of the chest performed recently.  I personally and independently reviewed the scan images and discussed the result with the patient today. Her scan showed no concerning findings for disease recurrence or metastasis but she has evolving radiation changes in the right suprahilar region. I recommended for the patient to continue on observation with repeat CT scan of the chest in 6 months. She was advised to call immediately if she has any other concerning symptoms in the interval. The patient voices understanding of current disease status and treatment options and is in agreement with the current care plan.  All questions were answered. The patient knows to call the  clinic with any problems, questions or concerns. We can certainly see the patient much sooner if necessary.  The total time spent in the appointment was 20 minutes.  Disclaimer: This note was dictated with voice recognition software. Similar sounding words can inadvertently be transcribed and may not be corrected upon review.

## 2023-09-13 ENCOUNTER — Other Ambulatory Visit: Payer: Self-pay | Admitting: Family Medicine

## 2023-12-09 ENCOUNTER — Other Ambulatory Visit: Payer: Self-pay | Admitting: Family Medicine

## 2023-12-16 ENCOUNTER — Telehealth: Payer: Self-pay | Admitting: Family Medicine

## 2023-12-16 NOTE — Telephone Encounter (Signed)
Copied from CRM [id number]. Topic: General - Other >> Dec 16, 2023 10:46 AM Almira Coaster wrote: Reason for CRM: Vonna Kotyk from [facility] is calling to confirm if we received lab requisition forms that were faxed this morning, May we please give a call back to verify if forms were received. Call back number is [phone].

## 2023-12-18 NOTE — Telephone Encounter (Signed)
Pt forms completed and faxed, confirmation received and Vonna Kotyk was notified

## 2023-12-18 NOTE — Telephone Encounter (Signed)
Attempted to call back regarding this message not successful speaking with anyone will try again later

## 2023-12-18 NOTE — Telephone Encounter (Signed)
Copied from CRM [id number]. Topic: General - Other >> Dec 18, 2023  9:19 AM Pascal Lux wrote: Reason for CRM: Vonna Kotyk from [facility] called to confirm if we received lab requisition forms that were faxed yesterday. Reached out to CAL and spoke w/ Ms Nelva Bush who advised for him to refax with her name and she will get it over to the provider. He is requesting to confirm it was received.  **form received and routed over to provider

## 2024-01-15 ENCOUNTER — Inpatient Hospital Stay: Payer: Medicare (Managed Care) | Attending: Internal Medicine

## 2024-01-17 ENCOUNTER — Telehealth: Payer: Self-pay | Admitting: Medical Oncology

## 2024-01-17 NOTE — Telephone Encounter (Signed)
 LVM to call me back after she schedules her Ct scan . I will ask Arbutus Ped re r/s appt .

## 2024-01-20 ENCOUNTER — Telehealth: Payer: Self-pay | Admitting: Medical Oncology

## 2024-01-20 NOTE — Telephone Encounter (Signed)
 Unable to contact pt to schedule her scan.

## 2024-01-21 ENCOUNTER — Inpatient Hospital Stay: Payer: Medicare (Managed Care) | Admitting: Internal Medicine

## 2024-01-22 DIAGNOSIS — R350 Frequency of micturition: Secondary | ICD-10-CM | POA: Diagnosis not present

## 2024-01-22 DIAGNOSIS — N3941 Urge incontinence: Secondary | ICD-10-CM | POA: Diagnosis not present

## 2024-02-28 ENCOUNTER — Other Ambulatory Visit: Payer: Self-pay | Admitting: Family Medicine

## 2024-04-24 ENCOUNTER — Other Ambulatory Visit: Payer: Self-pay | Admitting: Family Medicine

## 2024-05-07 ENCOUNTER — Other Ambulatory Visit: Payer: Self-pay | Admitting: Family Medicine

## 2024-05-07 DIAGNOSIS — E1140 Type 2 diabetes mellitus with diabetic neuropathy, unspecified: Secondary | ICD-10-CM

## 2024-05-21 ENCOUNTER — Telehealth: Payer: Self-pay | Admitting: *Deleted

## 2024-05-21 NOTE — Telephone Encounter (Signed)
 Reason for CRM: [name] called in to report they faxed lab requisition form for a UTI today and wanted to confirm if it was received.            Callback    [phone]            Fax number [phone]

## 2024-05-22 NOTE — Telephone Encounter (Signed)
 Pt form faxed as requested

## 2024-05-22 NOTE — Telephone Encounter (Signed)
 We received pt lab requisition for UTI, Will fax paperwork when signed by Dr Johnny

## 2024-06-01 ENCOUNTER — Other Ambulatory Visit: Payer: Self-pay | Admitting: Family Medicine

## 2024-06-01 MED ORDER — DICLOFENAC SODIUM 75 MG PO TBEC: 75.0000 mg | DELAYED_RELEASE_TABLET | Freq: Two times a day (BID) | ORAL | 0 refills | Status: DC

## 2024-06-01 NOTE — Telephone Encounter (Signed)
 Rx re-sent as initially sent under Dr Heddie name in my error instead of PCP.

## 2024-06-01 NOTE — Addendum Note (Signed)
 Addended by: CHRISTYNE IDELL LABOR on: 06/01/2024 04:52 PM   Modules accepted: Orders

## 2024-06-01 NOTE — Telephone Encounter (Signed)
 This is Dr. Mira patient -- is he off today?

## 2024-06-12 ENCOUNTER — Other Ambulatory Visit: Payer: Self-pay | Admitting: Family Medicine

## 2024-06-15 ENCOUNTER — Other Ambulatory Visit: Payer: Self-pay | Admitting: Family Medicine

## 2024-06-17 ENCOUNTER — Other Ambulatory Visit: Payer: Self-pay | Admitting: Family Medicine

## 2024-07-17 ENCOUNTER — Other Ambulatory Visit: Payer: Self-pay | Admitting: Family Medicine

## 2024-08-05 ENCOUNTER — Other Ambulatory Visit: Payer: Self-pay | Admitting: Family Medicine

## 2024-08-31 ENCOUNTER — Ambulatory Visit (INDEPENDENT_AMBULATORY_CARE_PROVIDER_SITE_OTHER): Admitting: Family Medicine

## 2024-08-31 ENCOUNTER — Encounter: Payer: Self-pay | Admitting: Family Medicine

## 2024-08-31 VITALS — BP 130/70 | HR 66 | Temp 97.3°F | Wt 218.0 lb

## 2024-08-31 DIAGNOSIS — R600 Localized edema: Secondary | ICD-10-CM | POA: Diagnosis not present

## 2024-08-31 DIAGNOSIS — E1122 Type 2 diabetes mellitus with diabetic chronic kidney disease: Secondary | ICD-10-CM

## 2024-08-31 DIAGNOSIS — E1140 Type 2 diabetes mellitus with diabetic neuropathy, unspecified: Secondary | ICD-10-CM

## 2024-08-31 DIAGNOSIS — H9202 Otalgia, left ear: Secondary | ICD-10-CM | POA: Diagnosis not present

## 2024-08-31 DIAGNOSIS — I10 Essential (primary) hypertension: Secondary | ICD-10-CM | POA: Diagnosis not present

## 2024-08-31 DIAGNOSIS — N184 Chronic kidney disease, stage 4 (severe): Secondary | ICD-10-CM | POA: Diagnosis not present

## 2024-08-31 LAB — POCT GLYCOSYLATED HEMOGLOBIN (HGB A1C): Hemoglobin A1C: 7 % — AB (ref 4.0–5.6)

## 2024-08-31 MED ORDER — FUROSEMIDE 40 MG PO TABS: 40.0000 mg | ORAL_TABLET | Freq: Every day | ORAL | 3 refills | Status: AC

## 2024-08-31 MED ORDER — ROSUVASTATIN CALCIUM 40 MG PO TABS
40.0000 mg | ORAL_TABLET | Freq: Every day | ORAL | 3 refills | Status: AC
Start: 2024-08-31 — End: ?

## 2024-08-31 MED ORDER — SPIRONOLACTONE 25 MG PO TABS: 25.0000 mg | ORAL_TABLET | Freq: Every day | ORAL | 3 refills | Status: AC

## 2024-08-31 MED ORDER — LISINOPRIL 10 MG PO TABS: 20.0000 mg | ORAL_TABLET | Freq: Every day | ORAL | 0 refills | Status: DC

## 2024-08-31 MED ORDER — GLIPIZIDE 10 MG PO TABS: 10.0000 mg | ORAL_TABLET | Freq: Two times a day (BID) | ORAL | 3 refills | Status: AC

## 2024-08-31 MED ORDER — METFORMIN HCL 500 MG PO TABS: 500.0000 mg | ORAL_TABLET | Freq: Two times a day (BID) | ORAL | 3 refills | Status: AC

## 2024-08-31 MED ORDER — DICLOFENAC SODIUM 75 MG PO TBEC: 75.0000 mg | DELAYED_RELEASE_TABLET | Freq: Two times a day (BID) | ORAL | 3 refills | Status: AC

## 2024-08-31 MED ORDER — EMPAGLIFLOZIN 25 MG PO TABS: 25.0000 mg | ORAL_TABLET | Freq: Every day | ORAL | 3 refills | Status: AC

## 2024-08-31 MED ORDER — METOPROLOL SUCCINATE ER 50 MG PO TB24: 50.0000 mg | ORAL_TABLET | Freq: Every day | ORAL | 3 refills | Status: AC

## 2024-08-31 NOTE — Progress Notes (Signed)
   Subjective:    Patient ID: [name], female    DOB: 01-Feb-1946, 78 y.o.   MRN: [id number]  HPI Here to follow up on several issues. She has been feeling well except for some left ear pain that comes and goes. She also has a clear drainage from the ear. No sinus congestion. She saw ENT for this in 2018. Her BP has been stable. She does not check her blood glucoses. In reviewing her chart, I see her labs from 07-18-24 showed a creatinine of 2.09 and a GFR of 24. She has apparently never seen Nephrology other than when she was in the hospital.    Review of Systems  Constitutional: Negative.   HENT:  Positive for ear discharge and ear pain. Negative for congestion, postnasal drip, sinus pressure and sore throat.   Respiratory: Negative.    Cardiovascular:  Positive for leg swelling. Negative for chest pain and palpitations.       Objective:   Physical Exam Constitutional:      Comments: Walks with a rolling walker   HENT:     Right Ear: Tympanic membrane, ear canal and external ear normal.     Left Ear: Tympanic membrane, ear canal and external ear normal.     Nose: Nose normal.     Mouth/Throat:     Pharynx: Oropharynx is clear.  Eyes:     Conjunctiva/sclera: Conjunctivae normal.  Cardiovascular:     Rate and Rhythm: Normal rate and regular rhythm.     Pulses: Normal pulses.     Heart sounds: Normal heart sounds.  Pulmonary:     Effort: Pulmonary effort is normal.     Breath sounds: Normal breath sounds.  Musculoskeletal:     Comments: 2+ edema in both lower legs   Lymphadenopathy:     Cervical: No cervical adenopathy.  Neurological:     Mental Status: She is alert.           Assessment & Plan:  Her HTN is well controlled. Her type 2 diabetes is reasonably well controlled. Her leg edema is stable. She has CKD stage 4 so we will refer her to Nephrology. Garnette Olmsted, MD

## 2024-08-31 NOTE — Addendum Note (Signed)
 Addended by: LADONNA INOCENTE SAILOR on: 08/31/2024 05:09 PM   Modules accepted: Orders

## 2024-09-07 ENCOUNTER — Telehealth: Payer: Self-pay | Admitting: Family Medicine

## 2024-09-07 NOTE — Telephone Encounter (Signed)
 Copied from CRM [id number]. Topic: Referral - Status >> Sep 07, 2024 10:17 AM Franky GRADE wrote: Reason for CRM: Patient is calling to follow up on the referral status for Nephrology & Otolaryngology that were placed on 08/31/2024. She has not heard back but those departments or has gotten any type of update.

## 2024-09-10 DIAGNOSIS — H9212 Otorrhea, left ear: Secondary | ICD-10-CM | POA: Diagnosis not present

## 2024-09-10 DIAGNOSIS — Z9622 Myringotomy tube(s) status: Secondary | ICD-10-CM | POA: Diagnosis not present

## 2024-09-30 ENCOUNTER — Telehealth: Payer: Self-pay | Admitting: Family Medicine

## 2024-09-30 NOTE — Telephone Encounter (Signed)
 Copied from CRM #[id number]. Topic: General - Other >> Sep 30, 2024  3:15 PM Eva FALCON wrote: Reason for CRM: Morene from [facility] was calling in to see if we received a fax for new forms for this patient, could someone confirm if we did? Did not see anything in chart, and the one that was uploaded today he states its not the form he fax. Call back number is [phone].

## 2024-10-01 NOTE — Telephone Encounter (Signed)
 Spoke with Morene advised to refax pt paperwork

## 2024-10-05 NOTE — Telephone Encounter (Signed)
 [name] from [facility] called to see if we received the correct form from them that was refaxed on 10/01/24? He stated that if it was received ,they would like to to be completed and faxed back as soon as possible.

## 2024-10-06 ENCOUNTER — Other Ambulatory Visit: Payer: Self-pay | Admitting: Nephrology

## 2024-10-06 DIAGNOSIS — N184 Chronic kidney disease, stage 4 (severe): Secondary | ICD-10-CM

## 2024-10-07 NOTE — Telephone Encounter (Signed)
 The form is ready

## 2024-10-07 NOTE — Telephone Encounter (Signed)
 FYI Pt form received and placed in Dr Johnny red folder

## 2024-10-12 NOTE — Telephone Encounter (Signed)
 Pt form from [facility] was successfully faxed to the number provided

## 2024-10-29 ENCOUNTER — Ambulatory Visit
Admission: RE | Admit: 2024-10-29 | Discharge: 2024-10-29 | Disposition: A | Source: Ambulatory Visit | Attending: Nephrology | Admitting: Nephrology

## 2024-10-29 DIAGNOSIS — N184 Chronic kidney disease, stage 4 (severe): Secondary | ICD-10-CM

## 2024-12-30 ENCOUNTER — Other Ambulatory Visit: Payer: Self-pay | Admitting: Family Medicine
